# Patient Record
Sex: Male | Born: 1935
Health system: Southern US, Community
[De-identification: ages and names within clinical notes are randomized; demographics above are authoritative.]

## PROBLEM LIST (undated history)

## (undated) DIAGNOSIS — E786 Lipoprotein deficiency: Secondary | ICD-10-CM

## (undated) DIAGNOSIS — K259 Gastric ulcer, unspecified as acute or chronic, without hemorrhage or perforation: Secondary | ICD-10-CM

## (undated) DIAGNOSIS — L570 Actinic keratosis: Secondary | ICD-10-CM

## (undated) DIAGNOSIS — Z87891 Personal history of nicotine dependence: Secondary | ICD-10-CM

## (undated) DIAGNOSIS — M1990 Unspecified osteoarthritis, unspecified site: Secondary | ICD-10-CM

## (undated) DIAGNOSIS — Z8673 Personal history of transient ischemic attack (TIA), and cerebral infarction without residual deficits: Secondary | ICD-10-CM

## (undated) DIAGNOSIS — R7303 Prediabetes: Secondary | ICD-10-CM

## (undated) DIAGNOSIS — F329 Major depressive disorder, single episode, unspecified: Secondary | ICD-10-CM

## (undated) DIAGNOSIS — H269 Unspecified cataract: Secondary | ICD-10-CM

## (undated) DIAGNOSIS — I10 Essential (primary) hypertension: Secondary | ICD-10-CM

## (undated) DIAGNOSIS — D649 Anemia, unspecified: Secondary | ICD-10-CM

## (undated) DIAGNOSIS — C4491 Basal cell carcinoma of skin, unspecified: Secondary | ICD-10-CM

## (undated) DIAGNOSIS — E785 Hyperlipidemia, unspecified: Secondary | ICD-10-CM

## (undated) DIAGNOSIS — F0390 Unspecified dementia without behavioral disturbance: Secondary | ICD-10-CM

## (undated) DIAGNOSIS — K5792 Diverticulitis of intestine, part unspecified, without perforation or abscess without bleeding: Secondary | ICD-10-CM

## (undated) DIAGNOSIS — R739 Hyperglycemia, unspecified: Secondary | ICD-10-CM

## (undated) DIAGNOSIS — J449 Chronic obstructive pulmonary disease, unspecified: Secondary | ICD-10-CM

## (undated) DIAGNOSIS — F32A Depression, unspecified: Secondary | ICD-10-CM

## (undated) DIAGNOSIS — C439 Malignant melanoma of skin, unspecified: Secondary | ICD-10-CM

## (undated) DIAGNOSIS — I5030 Unspecified diastolic (congestive) heart failure: Secondary | ICD-10-CM

## (undated) DIAGNOSIS — J189 Pneumonia, unspecified organism: Secondary | ICD-10-CM

## (undated) DIAGNOSIS — Z973 Presence of spectacles and contact lenses: Secondary | ICD-10-CM

## (undated) DIAGNOSIS — Z9289 Personal history of other medical treatment: Secondary | ICD-10-CM

## (undated) DIAGNOSIS — G2581 Restless legs syndrome: Secondary | ICD-10-CM

## (undated) HISTORY — DX: Prediabetes: R73.03

## (undated) HISTORY — PX: NASAL SEPTUM SURGERY: SHX37

## (undated) HISTORY — DX: Lipoprotein deficiency: E78.6

## (undated) HISTORY — DX: Gastric ulcer, unspecified as acute or chronic, without hemorrhage or perforation: K25.9

## (undated) HISTORY — DX: Anemia, unspecified: D64.9

## (undated) HISTORY — PX: VASECTOMY: SHX75

## (undated) HISTORY — DX: Essential (primary) hypertension: I10

## (undated) HISTORY — DX: Personal history of transient ischemic attack (TIA), and cerebral infarction without residual deficits: Z86.73

## (undated) HISTORY — DX: Actinic keratosis: L57.0

## (undated) HISTORY — DX: Hyperlipidemia, unspecified: E78.5

## (undated) HISTORY — DX: Unspecified diastolic (congestive) heart failure: I50.30

## (undated) HISTORY — PX: MELANOMA EXCISION: SHX5266

## (undated) HISTORY — PX: DRUG INDUCED ENDOSCOPY: SHX6808

## (undated) HISTORY — DX: Restless legs syndrome: G25.81

## (undated) HISTORY — PX: COLONOSCOPY: SHX174

## (undated) HISTORY — PX: PROSTATE SURGERY: SHX751

## (undated) HISTORY — DX: Basal cell carcinoma of skin, unspecified: C44.91

## (undated) HISTORY — PX: HEMORROIDECTOMY: SUR656

## (undated) HISTORY — DX: Chronic obstructive pulmonary disease, unspecified: J44.9

## (undated) HISTORY — PX: CATARACT EXTRACTION: SUR2

## (undated) HISTORY — DX: Hyperglycemia, unspecified: R73.9

---
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
## 2010-05-01 DIAGNOSIS — I639 Cerebral infarction, unspecified: Secondary | ICD-10-CM | POA: Insufficient documentation

## 2010-05-01 HISTORY — DX: Cerebral infarction, unspecified: I63.9

## 2015-06-09 DIAGNOSIS — J449 Chronic obstructive pulmonary disease, unspecified: Secondary | ICD-10-CM

## 2015-06-09 DIAGNOSIS — I119 Hypertensive heart disease without heart failure: Secondary | ICD-10-CM | POA: Insufficient documentation

## 2015-06-09 DIAGNOSIS — N529 Male erectile dysfunction, unspecified: Secondary | ICD-10-CM

## 2015-06-09 DIAGNOSIS — F0150 Vascular dementia without behavioral disturbance: Secondary | ICD-10-CM

## 2015-06-09 DIAGNOSIS — E538 Deficiency of other specified B group vitamins: Secondary | ICD-10-CM

## 2015-06-09 DIAGNOSIS — R600 Localized edema: Secondary | ICD-10-CM

## 2015-06-09 DIAGNOSIS — R260 Ataxic gait: Secondary | ICD-10-CM

## 2015-06-09 DIAGNOSIS — F3342 Major depressive disorder, recurrent, in full remission: Secondary | ICD-10-CM

## 2015-06-09 DIAGNOSIS — I679 Cerebrovascular disease, unspecified: Secondary | ICD-10-CM

## 2015-06-09 DIAGNOSIS — N400 Enlarged prostate without lower urinary tract symptoms: Secondary | ICD-10-CM

## 2015-06-09 DIAGNOSIS — R6882 Decreased libido: Secondary | ICD-10-CM

## 2015-06-09 DIAGNOSIS — I739 Peripheral vascular disease, unspecified: Secondary | ICD-10-CM

## 2015-06-09 HISTORY — DX: Cerebrovascular disease, unspecified: I67.9

## 2015-06-09 HISTORY — DX: Ataxic gait: R26.0

## 2015-06-09 HISTORY — DX: Peripheral vascular disease, unspecified: I73.9

## 2015-06-09 HISTORY — DX: Vascular dementia, unspecified severity, without behavioral disturbance, psychotic disturbance, mood disturbance, and anxiety: F01.50

## 2015-06-09 HISTORY — DX: Decreased libido: R68.82

## 2015-06-09 HISTORY — DX: Chronic obstructive pulmonary disease, unspecified: J44.9

## 2015-06-09 HISTORY — DX: Hypertensive heart disease without heart failure: I11.9

## 2015-06-09 HISTORY — DX: Benign prostatic hyperplasia without lower urinary tract symptoms: N40.0

## 2015-06-09 HISTORY — DX: Male erectile dysfunction, unspecified: N52.9

## 2015-06-09 HISTORY — DX: Localized edema: R60.0

## 2015-06-09 HISTORY — DX: Major depressive disorder, recurrent, in full remission: F33.42

## 2015-06-09 HISTORY — DX: Deficiency of other specified B group vitamins: E53.8

## 2015-06-10 DIAGNOSIS — K2100 Gastro-esophageal reflux disease with esophagitis, without bleeding: Secondary | ICD-10-CM | POA: Insufficient documentation

## 2015-06-10 DIAGNOSIS — K257 Chronic gastric ulcer without hemorrhage or perforation: Secondary | ICD-10-CM

## 2015-06-10 HISTORY — DX: Chronic gastric ulcer without hemorrhage or perforation: K25.7

## 2015-06-10 HISTORY — DX: Gastro-esophageal reflux disease with esophagitis, without bleeding: K21.00

## 2016-05-16 DIAGNOSIS — H35371 Puckering of macula, right eye: Secondary | ICD-10-CM | POA: Diagnosis not present

## 2016-05-16 DIAGNOSIS — Z01818 Encounter for other preprocedural examination: Secondary | ICD-10-CM | POA: Diagnosis not present

## 2016-06-01 DIAGNOSIS — K591 Functional diarrhea: Secondary | ICD-10-CM | POA: Diagnosis not present

## 2016-06-08 DIAGNOSIS — H35371 Puckering of macula, right eye: Secondary | ICD-10-CM | POA: Diagnosis not present

## 2016-06-16 DIAGNOSIS — N183 Chronic kidney disease, stage 3 (moderate): Secondary | ICD-10-CM | POA: Diagnosis not present

## 2016-06-16 DIAGNOSIS — I129 Hypertensive chronic kidney disease with stage 1 through stage 4 chronic kidney disease, or unspecified chronic kidney disease: Secondary | ICD-10-CM | POA: Diagnosis not present

## 2016-06-16 DIAGNOSIS — E782 Mixed hyperlipidemia: Secondary | ICD-10-CM | POA: Diagnosis not present

## 2016-06-16 DIAGNOSIS — G2581 Restless legs syndrome: Secondary | ICD-10-CM | POA: Diagnosis not present

## 2016-06-26 DIAGNOSIS — L570 Actinic keratosis: Secondary | ICD-10-CM | POA: Diagnosis not present

## 2016-06-26 DIAGNOSIS — Z85820 Personal history of malignant melanoma of skin: Secondary | ICD-10-CM | POA: Diagnosis not present

## 2016-06-26 DIAGNOSIS — D0359 Melanoma in situ of other part of trunk: Secondary | ICD-10-CM | POA: Diagnosis not present

## 2016-06-26 DIAGNOSIS — C44311 Basal cell carcinoma of skin of nose: Secondary | ICD-10-CM | POA: Diagnosis not present

## 2016-06-26 DIAGNOSIS — L578 Other skin changes due to chronic exposure to nonionizing radiation: Secondary | ICD-10-CM | POA: Diagnosis not present

## 2016-07-17 DIAGNOSIS — K529 Noninfective gastroenteritis and colitis, unspecified: Secondary | ICD-10-CM | POA: Diagnosis not present

## 2016-07-17 DIAGNOSIS — E291 Testicular hypofunction: Secondary | ICD-10-CM | POA: Diagnosis not present

## 2016-07-18 DIAGNOSIS — Z85820 Personal history of malignant melanoma of skin: Secondary | ICD-10-CM | POA: Diagnosis not present

## 2016-07-18 DIAGNOSIS — C44311 Basal cell carcinoma of skin of nose: Secondary | ICD-10-CM | POA: Diagnosis not present

## 2016-07-18 DIAGNOSIS — L570 Actinic keratosis: Secondary | ICD-10-CM | POA: Diagnosis not present

## 2016-07-18 DIAGNOSIS — L578 Other skin changes due to chronic exposure to nonionizing radiation: Secondary | ICD-10-CM | POA: Diagnosis not present

## 2016-07-18 DIAGNOSIS — L821 Other seborrheic keratosis: Secondary | ICD-10-CM | POA: Diagnosis not present

## 2016-07-24 DIAGNOSIS — Z91018 Allergy to other foods: Secondary | ICD-10-CM | POA: Diagnosis not present

## 2016-08-14 DIAGNOSIS — K529 Noninfective gastroenteritis and colitis, unspecified: Secondary | ICD-10-CM | POA: Diagnosis not present

## 2016-08-14 DIAGNOSIS — Z91018 Allergy to other foods: Secondary | ICD-10-CM | POA: Diagnosis not present

## 2016-08-21 DIAGNOSIS — J449 Chronic obstructive pulmonary disease, unspecified: Secondary | ICD-10-CM | POA: Diagnosis not present

## 2016-08-21 DIAGNOSIS — M25511 Pain in right shoulder: Secondary | ICD-10-CM | POA: Diagnosis not present

## 2016-08-21 DIAGNOSIS — R413 Other amnesia: Secondary | ICD-10-CM | POA: Diagnosis not present

## 2016-08-21 DIAGNOSIS — E291 Testicular hypofunction: Secondary | ICD-10-CM | POA: Diagnosis not present

## 2016-08-24 DIAGNOSIS — H35372 Puckering of macula, left eye: Secondary | ICD-10-CM | POA: Diagnosis not present

## 2016-09-12 DIAGNOSIS — E291 Testicular hypofunction: Secondary | ICD-10-CM | POA: Diagnosis not present

## 2016-09-12 DIAGNOSIS — J449 Chronic obstructive pulmonary disease, unspecified: Secondary | ICD-10-CM | POA: Diagnosis not present

## 2016-09-12 DIAGNOSIS — R413 Other amnesia: Secondary | ICD-10-CM | POA: Diagnosis not present

## 2016-09-12 DIAGNOSIS — Z91018 Allergy to other foods: Secondary | ICD-10-CM | POA: Diagnosis not present

## 2016-09-12 DIAGNOSIS — K529 Noninfective gastroenteritis and colitis, unspecified: Secondary | ICD-10-CM | POA: Diagnosis not present

## 2016-10-03 DIAGNOSIS — R948 Abnormal results of function studies of other organs and systems: Secondary | ICD-10-CM | POA: Diagnosis not present

## 2016-10-03 DIAGNOSIS — E291 Testicular hypofunction: Secondary | ICD-10-CM | POA: Diagnosis not present

## 2016-11-15 DIAGNOSIS — K269 Duodenal ulcer, unspecified as acute or chronic, without hemorrhage or perforation: Secondary | ICD-10-CM | POA: Diagnosis not present

## 2016-11-15 DIAGNOSIS — K591 Functional diarrhea: Secondary | ICD-10-CM | POA: Diagnosis not present

## 2016-11-15 DIAGNOSIS — K2980 Duodenitis without bleeding: Secondary | ICD-10-CM | POA: Diagnosis not present

## 2016-11-15 DIAGNOSIS — K317 Polyp of stomach and duodenum: Secondary | ICD-10-CM | POA: Diagnosis not present

## 2016-11-15 DIAGNOSIS — K5730 Diverticulosis of large intestine without perforation or abscess without bleeding: Secondary | ICD-10-CM | POA: Diagnosis not present

## 2016-11-15 DIAGNOSIS — K648 Other hemorrhoids: Secondary | ICD-10-CM | POA: Diagnosis not present

## 2016-11-15 DIAGNOSIS — D122 Benign neoplasm of ascending colon: Secondary | ICD-10-CM | POA: Diagnosis not present

## 2016-12-05 DIAGNOSIS — E782 Mixed hyperlipidemia: Secondary | ICD-10-CM | POA: Diagnosis not present

## 2016-12-05 DIAGNOSIS — Z0000 Encounter for general adult medical examination without abnormal findings: Secondary | ICD-10-CM | POA: Diagnosis not present

## 2016-12-05 DIAGNOSIS — Z1211 Encounter for screening for malignant neoplasm of colon: Secondary | ICD-10-CM | POA: Diagnosis not present

## 2016-12-05 DIAGNOSIS — R739 Hyperglycemia, unspecified: Secondary | ICD-10-CM | POA: Diagnosis not present

## 2016-12-05 DIAGNOSIS — Z79899 Other long term (current) drug therapy: Secondary | ICD-10-CM | POA: Diagnosis not present

## 2016-12-05 DIAGNOSIS — Z125 Encounter for screening for malignant neoplasm of prostate: Secondary | ICD-10-CM | POA: Diagnosis not present

## 2016-12-05 DIAGNOSIS — I129 Hypertensive chronic kidney disease with stage 1 through stage 4 chronic kidney disease, or unspecified chronic kidney disease: Secondary | ICD-10-CM | POA: Diagnosis not present

## 2016-12-20 DIAGNOSIS — Z136 Encounter for screening for cardiovascular disorders: Secondary | ICD-10-CM | POA: Diagnosis not present

## 2016-12-20 DIAGNOSIS — E785 Hyperlipidemia, unspecified: Secondary | ICD-10-CM | POA: Diagnosis not present

## 2016-12-20 DIAGNOSIS — Z1389 Encounter for screening for other disorder: Secondary | ICD-10-CM | POA: Diagnosis not present

## 2016-12-20 DIAGNOSIS — Z0000 Encounter for general adult medical examination without abnormal findings: Secondary | ICD-10-CM | POA: Diagnosis not present

## 2016-12-25 DIAGNOSIS — H524 Presbyopia: Secondary | ICD-10-CM | POA: Diagnosis not present

## 2016-12-25 DIAGNOSIS — H35372 Puckering of macula, left eye: Secondary | ICD-10-CM | POA: Diagnosis not present

## 2017-01-05 DIAGNOSIS — E291 Testicular hypofunction: Secondary | ICD-10-CM | POA: Diagnosis not present

## 2017-02-21 DIAGNOSIS — I10 Essential (primary) hypertension: Secondary | ICD-10-CM | POA: Diagnosis not present

## 2017-02-21 DIAGNOSIS — D126 Benign neoplasm of colon, unspecified: Secondary | ICD-10-CM | POA: Diagnosis not present

## 2017-02-21 DIAGNOSIS — D122 Benign neoplasm of ascending colon: Secondary | ICD-10-CM | POA: Diagnosis not present

## 2017-02-21 DIAGNOSIS — Z885 Allergy status to narcotic agent status: Secondary | ICD-10-CM | POA: Diagnosis not present

## 2017-02-21 DIAGNOSIS — Z7902 Long term (current) use of antithrombotics/antiplatelets: Secondary | ICD-10-CM | POA: Diagnosis not present

## 2017-02-21 DIAGNOSIS — K210 Gastro-esophageal reflux disease with esophagitis: Secondary | ICD-10-CM | POA: Diagnosis not present

## 2017-02-21 DIAGNOSIS — K257 Chronic gastric ulcer without hemorrhage or perforation: Secondary | ICD-10-CM | POA: Diagnosis not present

## 2017-02-21 DIAGNOSIS — J449 Chronic obstructive pulmonary disease, unspecified: Secondary | ICD-10-CM | POA: Diagnosis not present

## 2017-02-21 DIAGNOSIS — Z79899 Other long term (current) drug therapy: Secondary | ICD-10-CM | POA: Diagnosis not present

## 2017-02-21 DIAGNOSIS — Z91013 Allergy to seafood: Secondary | ICD-10-CM | POA: Diagnosis not present

## 2017-02-21 DIAGNOSIS — Z87891 Personal history of nicotine dependence: Secondary | ICD-10-CM | POA: Diagnosis not present

## 2017-02-27 DIAGNOSIS — Z23 Encounter for immunization: Secondary | ICD-10-CM | POA: Diagnosis not present

## 2017-02-27 DIAGNOSIS — E291 Testicular hypofunction: Secondary | ICD-10-CM | POA: Diagnosis not present

## 2017-03-14 DIAGNOSIS — E559 Vitamin D deficiency, unspecified: Secondary | ICD-10-CM | POA: Diagnosis not present

## 2017-03-14 DIAGNOSIS — I129 Hypertensive chronic kidney disease with stage 1 through stage 4 chronic kidney disease, or unspecified chronic kidney disease: Secondary | ICD-10-CM | POA: Diagnosis not present

## 2017-03-14 DIAGNOSIS — E782 Mixed hyperlipidemia: Secondary | ICD-10-CM | POA: Diagnosis not present

## 2017-03-14 DIAGNOSIS — J449 Chronic obstructive pulmonary disease, unspecified: Secondary | ICD-10-CM | POA: Diagnosis not present

## 2017-03-14 DIAGNOSIS — N183 Chronic kidney disease, stage 3 (moderate): Secondary | ICD-10-CM | POA: Diagnosis not present

## 2017-03-28 DIAGNOSIS — H35372 Puckering of macula, left eye: Secondary | ICD-10-CM | POA: Diagnosis not present

## 2017-04-21 DIAGNOSIS — J069 Acute upper respiratory infection, unspecified: Secondary | ICD-10-CM | POA: Diagnosis not present

## 2017-04-21 DIAGNOSIS — J209 Acute bronchitis, unspecified: Secondary | ICD-10-CM | POA: Diagnosis not present

## 2017-04-21 DIAGNOSIS — J449 Chronic obstructive pulmonary disease, unspecified: Secondary | ICD-10-CM | POA: Diagnosis not present

## 2017-05-05 DIAGNOSIS — J441 Chronic obstructive pulmonary disease with (acute) exacerbation: Secondary | ICD-10-CM | POA: Diagnosis not present

## 2017-05-05 DIAGNOSIS — J440 Chronic obstructive pulmonary disease with acute lower respiratory infection: Secondary | ICD-10-CM | POA: Diagnosis not present

## 2017-05-05 DIAGNOSIS — R05 Cough: Secondary | ICD-10-CM | POA: Diagnosis not present

## 2017-05-11 DIAGNOSIS — J440 Chronic obstructive pulmonary disease with acute lower respiratory infection: Secondary | ICD-10-CM | POA: Diagnosis not present

## 2017-05-11 DIAGNOSIS — J449 Chronic obstructive pulmonary disease, unspecified: Secondary | ICD-10-CM | POA: Diagnosis not present

## 2017-05-23 DIAGNOSIS — J449 Chronic obstructive pulmonary disease, unspecified: Secondary | ICD-10-CM | POA: Diagnosis not present

## 2017-06-11 DIAGNOSIS — J449 Chronic obstructive pulmonary disease, unspecified: Secondary | ICD-10-CM | POA: Diagnosis not present

## 2017-06-26 ENCOUNTER — Other Ambulatory Visit (INDEPENDENT_AMBULATORY_CARE_PROVIDER_SITE_OTHER): Payer: Medicare Other

## 2017-06-26 ENCOUNTER — Encounter: Payer: Self-pay | Admitting: Pulmonary Disease

## 2017-06-26 ENCOUNTER — Ambulatory Visit: Payer: Medicare Other | Admitting: Pulmonary Disease

## 2017-06-26 ENCOUNTER — Ambulatory Visit (INDEPENDENT_AMBULATORY_CARE_PROVIDER_SITE_OTHER)
Admission: RE | Admit: 2017-06-26 | Discharge: 2017-06-26 | Disposition: A | Discharge status: Home / Self Care | Payer: Medicare Other | Source: Ambulatory Visit | Attending: Pulmonary Disease | Admitting: Pulmonary Disease

## 2017-06-26 VITALS — BP 132/80 | HR 64 | Ht 64.0 in | Wt 176.0 lb

## 2017-06-26 DIAGNOSIS — R29898 Other symptoms and signs involving the musculoskeletal system: Secondary | ICD-10-CM | POA: Diagnosis not present

## 2017-06-26 DIAGNOSIS — I509 Heart failure, unspecified: Secondary | ICD-10-CM | POA: Diagnosis not present

## 2017-06-26 DIAGNOSIS — J449 Chronic obstructive pulmonary disease, unspecified: Secondary | ICD-10-CM | POA: Diagnosis not present

## 2017-06-26 DIAGNOSIS — J40 Bronchitis, not specified as acute or chronic: Secondary | ICD-10-CM | POA: Diagnosis not present

## 2017-06-26 DIAGNOSIS — R0600 Dyspnea, unspecified: Secondary | ICD-10-CM

## 2017-06-26 LAB — CBC WITH DIFFERENTIAL/PLATELET
Basophils Absolute: 0.1 10*3/uL (ref 0.0–0.1)
Basophils Relative: 0.9 % (ref 0.0–3.0)
EOS ABS: 0.2 10*3/uL (ref 0.0–0.7)
Eosinophils Relative: 2.2 % (ref 0.0–5.0)
HCT: 42.2 % (ref 39.0–52.0)
HEMOGLOBIN: 14.1 g/dL (ref 13.0–17.0)
LYMPHS ABS: 1.8 10*3/uL (ref 0.7–4.0)
Lymphocytes Relative: 22.3 % (ref 12.0–46.0)
MCHC: 33.4 g/dL (ref 30.0–36.0)
MCV: 91.7 fl (ref 78.0–100.0)
MONO ABS: 0.7 10*3/uL (ref 0.1–1.0)
Monocytes Relative: 8.1 % (ref 3.0–12.0)
NEUTROS PCT: 66.5 % (ref 43.0–77.0)
Neutro Abs: 5.4 10*3/uL (ref 1.4–7.7)
Platelets: 245 10*3/uL (ref 150.0–400.0)
RBC: 4.6 Mil/uL (ref 4.22–5.81)
RDW: 14 % (ref 11.5–15.5)
WBC: 8.1 10*3/uL (ref 4.0–10.5)

## 2017-06-26 LAB — TIQ-NTM

## 2017-06-26 LAB — BASIC METABOLIC PANEL
BUN: 30 mg/dL — AB (ref 6–23)
CALCIUM: 9.3 mg/dL (ref 8.4–10.5)
CO2: 24 mEq/L (ref 19–32)
CREATININE: 1.4 mg/dL (ref 0.40–1.50)
Chloride: 107 mEq/L (ref 96–112)
GFR: 51.53 mL/min — ABNORMAL LOW (ref >60.00)
Glucose, Bld: 99 mg/dL (ref 70–99)
Potassium: 4.3 mEq/L (ref 3.5–5.1)
Sodium: 138 mEq/L (ref 135–145)

## 2017-06-26 MED ORDER — FUROSEMIDE 20 MG PO TABS: 40.0000 mg | ORAL_TABLET | Freq: Every day | ORAL | Status: DC

## 2017-06-26 NOTE — Progress Notes (Signed)
Subjective:   PATIENT ID: Kevin Lynch. GENDER: male DOB: 1932/11/22, MRN: [id number]  Synopsis: Referred in February 2019 for COPD and pneumonia.  He has a history of CHF and had a stroke in 2013.  He smoked 1/2 ppd for 30 years, quit in 1983.  HPI  Chief Complaint  Patient presents with  . Advice Only    Referred by Dr. Marcello Moores for COPD.     Kevin Lynch was referred by his PCP for his COPD. > he has been taking Trelegy lately > in December he had a sinus infection and required antibiotics and prednisone > In January he developed the sensation of drowning on January 5 and he had to go to the ER for a few hours. > he was given breathing treatments and had a CXR > he was diagnosed with pneumonia > since then he has been coughing up mucus, though it is not as thick and yellow as it was when he started > he was treated with an antibiotic and prednisone once again > he continues to cough every day and he still feels short of breath > she feels like whenever he tries to exercise he feels short of breath > he wants to walk to the dog but he can't do that anymore due to the dyspnea > he says that the albuterol inhaler helps him when he takes it > he does'nt use the albuterol too much > In December he was exercising nearly every day > he uses his albuterol nebulizer a few times a day > his wife says that he was diagnosed 2-3 years ago, initially started on Breo > recently changed to Trelegy after all the trouble he has been having  He has been told that he has CHF > he has a little leg swelling > His weight has gone up in the last few months, about 10-15 pounds > He takes spironolactone  He takes plavix for a stoke.    He used to smoke cigarettes  Dyspnea: > when short of breath he notices wheezing > he feels a little chest congestion  Cough: > productive of mucus, typically yellow in color > He is coughing this up every day  He had a stroke in 2013.  Past Medical  History:  Diagnosis Date  . COPD (chronic obstructive pulmonary disease) ([place])   . Hyperglycemia   . Hyperlipidemia   . Hypertension   . Restless leg syndrome      Family History  Problem Relation Age of Onset  . Stroke Mother   . Asthma Father      Social History   Socioeconomic History  . Marital status: Married    Spouse name: Not on file  . Number of children: Not on file  . Years of education: Not on file  . Highest education level: Not on file  Social Needs  . Financial resource strain: Not on file  . Food insecurity - worry: Not on file  . Food insecurity - inability: Not on file  . Transportation needs - medical: Not on file  . Transportation needs - non-medical: Not on file  Occupational History  . Not on file  Tobacco Use  . Smoking status: Former Smoker    Packs/day: 0.50    Years: 30.00    Pack years: 15.00    Types: Cigarettes    Last attempt to quit: 05/01/1981    Years since quitting: 36.1  . Smokeless tobacco: Former Systems developer  Types: Chew  Substance and Sexual Activity  . Alcohol use: Not on file  . Drug use: Not on file  . Sexual activity: Not on file  Other Topics Concern  . Not on file  Social History Narrative  . Not on file     Allergies  Allergen Reactions  . Shellfish Allergy Hives and Shortness Of Breath  . Codeine Nausea Only     Outpatient Medications Prior to Visit  Medication Sig Dispense Refill  . clopidogrel (PLAVIX) 75 MG tablet Take 75 mg by mouth daily.    Marland Kitchen donepezil (ARICEPT) 10 MG tablet Take 10 mg by mouth at bedtime.    Marland Kitchen escitalopram (LEXAPRO) 10 MG tablet Take 10 mg by mouth daily.    . metoprolol succinate (TOPROL-XL) 25 MG 24 hr tablet Take 25 mg by mouth daily.    Marland Kitchen spironolactone (ALDACTONE) 25 MG tablet Take 25 mg by mouth daily.    Marland Kitchen terazosin (HYTRIN) 5 MG capsule Take 5 mg by mouth at bedtime.    . valsartan (DIOVAN) 160 MG tablet Take 160 mg by mouth daily.     No facility-administered medications prior to  visit.     Review of Systems  Constitutional: Negative for fever and weight loss.  HENT: Positive for congestion. Negative for ear pain, nosebleeds and sore throat.   Eyes: Negative for redness.  Respiratory: Positive for cough and shortness of breath. Negative for wheezing.   Cardiovascular: Negative for palpitations, leg swelling and PND.  Gastrointestinal: Negative for nausea and vomiting.  Genitourinary: Negative for dysuria.  Skin: Negative for rash.  Neurological: Negative for headaches.  Endo/Heme/Allergies: Does not bruise/bleed easily.  Psychiatric/Behavioral: Negative for depression. The patient is not nervous/anxious.       Objective:  Physical Exam   Vitals:   06/26/17 1622  BP: 132/80  Pulse: 64  SpO2: 95%  Weight: 176 lb (79.8 kg)  Height: 5\' 4"  (1.626 m)   Walked 500 feet on RA and his O2 saturation stayed above 94%  Gen: chronically ill appearing, no acute distress HENT: NCAT, OP clear, neck supple without masses Eyes: PERRL, EOMi Lymph: no cervical lymphadenopathy PULM: CTA B CV: RRR, no mgr, no JVD GI: BS+, soft, nontender, no hsm Derm: no rash or skin breakdown MSK: normal bulk and tone Neuro: A&Ox4, CN II-XII intact, some muscle wasting in thenar muscles of R hand, otherwise WNL Psyche: normal mood and affect   CBC No results found for: WBC, RBC, HGB, HCT, PLT, MCV, MCH, MCHC, RDW, LYMPHSABS, MONOABS, EOSABS, BASOSABS   Chest imaging: January 2019 chest x-ray images independently reviewed showing emphysema some nonspecific scarring versus atelectasis in the bases  PFT:  Labs:  Path:  Echo:  Heart Catheterization:   Records from his visit with his primary care physician in January reviewed, he was hospitalized in early January around the 11th with a COPD exacerbation and pneumonia.  He has a past medical history significant for COPD, hyperlipidemia.    Assessment & Plan:   Chronic obstructive pulmonary disease, unspecified COPD  type ([place]) - Plan: Spirometry with Graph  Dyspnea, unspecified type - Plan: DG Chest 2 View, Fungus Culture & Smear, Respiratory or Resp and Sputum Culture, AFB Culture & Smear, CBC w/Diff, B Nat Peptide, IgE, ECHOCARDIOGRAM COMPLETE  Congestive heart failure, unspecified HF chronicity, unspecified heart failure type [facility])  Muscular deconditioning  Discussion: Mr. Stick presents today with a complaint of shortness of breath after a severe COPD exacerbation in early January.  His symptoms may be explained just by severe COPD alone but I think that the recent weight gain, his volume overload status on physical exam and history of CHF are also at play.  He also seems to be a bit deconditioned compared to baseline.  It sounds as if he has vigorously exercised in the past but in the last several months he has been gaining weight and has not exercised with this illness.  So I think there is a significant amount of deconditioning.  I explained to him that the differential diagnosis of dyspnea is broad and includes heart disease, lung disease, and hematologic disease among others.  Neurologic disease can also contribute.  In his particular case with a diagnosis of COPD, CHF, and prior stroke with muscular deconditioning there is no surprise that he does have some shortness of breath.  Is not clear to me today if a change in his inhaler regimen will make a difference.  Plan: COPD: Check ambulatory oxygen level with walking today Spirometry testing today Continue Trelegy for now Use albuterol as needed for chest tightness wheezing or shortness of breath  Shortness of breath: Repeat chest x-ray Check complete blood count to look for anemia Check pro brain natruretic peptide to look for evidence of heart failure  Congestive heart failure: Take Lasix 40 mg daily times 5 days Check echocardiogram Come back in 1 week to see our nurse practitioner and will have a basic metabolic panel to make sure that  your potassium level is normal  Physical deconditioning: You likely need to be referred to pulmonary rehab, but we will check these tests before referring you  Chronic bronchitis: I think this goes along with COPD We will check mucus today to look for evidence of an underlying chronic infection, please provide [place] with a sample of your mucus  We will see you back in 1 week with an NP    Current Outpatient Medications:  .  clopidogrel (PLAVIX) 75 MG tablet, Take 75 mg by mouth daily., Disp: , Rfl:  .  donepezil (ARICEPT) 10 MG tablet, Take 10 mg by mouth at bedtime., Disp: , Rfl:  .  escitalopram (LEXAPRO) 10 MG tablet, Take 10 mg by mouth daily., Disp: , Rfl:  .  metoprolol succinate (TOPROL-XL) 25 MG 24 hr tablet, Take 25 mg by mouth daily., Disp: , Rfl:  .  spironolactone (ALDACTONE) 25 MG tablet, Take 25 mg by mouth daily., Disp: , Rfl:  .  terazosin (HYTRIN) 5 MG capsule, Take 5 mg by mouth at bedtime., Disp: , Rfl:  .  valsartan (DIOVAN) 160 MG tablet, Take 160 mg by mouth daily., Disp: , Rfl:

## 2017-06-26 NOTE — Patient Instructions (Addendum)
COPD: Check ambulatory oxygen level with walking today Spirometry testing today Continue Trelegy for now Use albuterol as needed for chest tightness wheezing or shortness of breath  Shortness of breath: Repeat chest x-ray Check complete blood count to look for anemia Check pro brain natruretic peptide to look for evidence of heart failure  Congestive heart failure: Take Lasix 40 mg daily times 5 days Check echocardiogram Come back in 1 week to see our nurse practitioner and will have a basic metabolic panel to make sure that your potassium level is normal  Physical deconditioning: You likely need to be referred to pulmonary rehab, but we will check these tests before referring you  Chronic bronchitis: I think this goes along with COPD We will check mucus today to look for evidence of an underlying chronic infection, please provide [place] with a sample of your mucus  We will see you back in 1 week with an NP

## 2017-06-27 ENCOUNTER — Ambulatory Visit (HOSPITAL_COMMUNITY): Payer: Medicare Other

## 2017-06-27 ENCOUNTER — Telehealth: Payer: Self-pay | Admitting: Pulmonary Disease

## 2017-06-27 LAB — IGE: IGE (IMMUNOGLOBULIN E), SERUM: 57 kU/L (ref <=114)

## 2017-06-27 LAB — BRAIN NATRIURETIC PEPTIDE: Pro B Natriuretic peptide (BNP): 68 pg/mL (ref 0.0–100.0)

## 2017-06-27 MED ORDER — FUROSEMIDE 40 MG PO TABS: 40.0000 mg | ORAL_TABLET | Freq: Every day | ORAL | 0 refills | Status: DC

## 2017-06-27 NOTE — Telephone Encounter (Signed)
Called and spoke with pt's wife Anne Ng letting her know that BQ had put the lasix in the computer wrong at pt's last OV with him.  I stated to her that I was going to take care of sending the Rx of lasix 40mg  to pt's pharmacy.  Nothing further needed at this current time.

## 2017-07-01 LAB — TEST AUTHORIZATION

## 2017-07-01 LAB — RESPIRATORY CULTURE OR RESPIRATORY AND SPUTUM CULTURE

## 2017-07-01 LAB — IGE: IGE (IMMUNOGLOBULIN E), SERUM: 55 kU/L (ref <=114)

## 2017-07-02 ENCOUNTER — Other Ambulatory Visit: Payer: Medicare Other

## 2017-07-02 DIAGNOSIS — R0600 Dyspnea, unspecified: Secondary | ICD-10-CM

## 2017-07-03 NOTE — Progress Notes (Addendum)
History of Present Illness Kevin Lynch. is a 84 y.o. male former smoker with a 15 pack year smoking history ( Quit 1983) with COPD, CHF, and history of pneumonia and stroke in 2013. He is followed by Dr. Lake Bells.   Synopsis: Referred in February 2019 for COPD and pneumonia with worsening dyspnea.  He has a history of CHF and had a stroke in 2013.  He smoked 1/2 ppd for 30 years, quit in 1983.  07/04/2017 1 week follow up:  Pt. Was seen by Dr. Lake Bells for consult for COPD on 06/26/2017. Assessment and plan after that visit are as follows:  Discussion: Dr. Lake Bells Kevin Lynch presents today with a complaint of shortness of breath after a severe COPD exacerbation in early January.  His symptoms may be explained just by severe COPD alone but I think that the recent weight gain, his volume overload status on physical exam and history of CHF are also at play.  He also seems to be a bit deconditioned compared to baseline.  It sounds as if he has vigorously exercised in the past but in the last several months he has been gaining weight and has not exercised with this illness.  So I think there is a significant amount of deconditioning.  Dr. Lake Bells  explained  that the differential diagnosis of dyspnea is broad and includes heart disease, lung disease, and hematologic disease among others.  Neurologic disease can also contribute.  In his particular case with a diagnosis of COPD, CHF, and prior stroke with muscular deconditioning there is no surprise that he does have some shortness of breath.  Is not clear to me today if a change in his inhaler regimen will make a difference.  Plan: COPD: Check ambulatory oxygen level with walking today Spirometry testing today Continue Trelegy for now Use albuterol as needed for chest tightness wheezing or shortness of breath  Shortness of breath: Repeat chest x-ray Check complete blood count to look for anemia Check pro brain natruretic peptide to look for  evidence of heart failure  Congestive heart failure: Take Lasix 40 mg daily times 5 days Check echocardiogram Come back in 1 week to see our nurse practitioner and will have a basic metabolic panel to make sure that your potassium level is normal  Physical deconditioning: You likely need to be referred to pulmonary rehab, but we will check these tests before referring you  Chronic bronchitis: I think this goes along with COPD We will check mucus today to look for evidence of an underlying chronic infection, please provide [place] with a sample of your mucus    Pt. Presents for follow up. He states he is doing well. He has been compliant with his Trelegy and only uses his rescue inhaler as needed. He states this is no more than once or twice a month.He states he has been using  the breathing treatments and that they  have helped with his dyspnea.Marland Kitchen He is using them 2-3 times a day. He states he does have some dyspnea with exertion when he walks his dog. He states he does cough up secretions, but he does not have a sample today. Secretions  are clear. He was compliant with the 5 days of Lasix.He states lost 9 pounds..He states he is breathing much better.He went for his echo today. Over all he feels much better. He is interested in pulmonary rehab.  Test Results: BMET 07/04/2017>> K+: 3.9, Creatinine 1.62, BUN 40 CXR 06/26/2017>> Mild chronic bronchitic  changes with stable bibasilar atelectasis versus scarring. No acute abnormalities.   07/04/2017>>Echo>>  EF 09-32%, Grade 1 diastolic HF Ascending Aorta mildly dilated Mitral Valve: Calcified annulus.  BMET 07/04/2017>>  Results for Kevin Lynch, Kevin Lynch (MRN [id number]) as of 07/06/2017 16:45  Ref. Range 07/04/2017 54:27  BASIC METABOLIC PANEL Unknown Rpt (A)  Sodium Latest Ref Range: 135 - 145 mEq/L 138  Potassium Latest Ref Range: 3.5 - 5.1 mEq/L 3.9  Chloride Latest Ref Range: 96 - 112 mEq/L 101  CO2 Latest Ref Range: 19 - 32 mEq/L 27  Glucose  Latest Ref Range: 70 - 99 mg/dL 105 (H)  BUN Latest Ref Range: 6 - 23 mg/dL 40 (H)  Creatinine Latest Ref Range: 0.40 - 1.50 mg/dL 1.62 (H)  Calcium Latest Ref Range: 8.4 - 10.5 mg/dL 9.5  GFR Latest Ref Range: >60.00 mL/min 43.54 (L)    Labs 06/26/17 HGB>>14.1 Pro BNP>> 68 IgE>> 55  Spirometry 2/26>> Normal FVC>> 2.98, 2.78, 93% FEV1>> 2.05, 2.28, 111% F/F Ratio>> 71%, 82%, 115% VC>> 2.98  CBC Latest Ref Rng & Units 06/26/2017  WBC 4.0 - 10.5 K/uL 8.1  Hemoglobin 13.0 - 17.0 g/dL 14.1  Hematocrit 39.0 - 52.0 % 42.2  Platelets 150.0 - 400.0 K/uL 245.0    BMP Latest Ref Rng & Units 07/04/2017 06/26/2017  Glucose 70 - 99 mg/dL 105(H) 99  BUN 6 - 23 mg/dL 40(H) 30(H)  Creatinine 0.40 - 1.50 mg/dL 1.62(H) 1.40  Sodium 135 - 145 mEq/L 138 138  Potassium 3.5 - 5.1 mEq/L 3.9 4.3  Chloride 96 - 112 mEq/L 101 107  CO2 19 - 32 mEq/L 27 24  Calcium 8.4 - 10.5 mg/dL 9.5 9.3    BNP No results found for: BNP  ProBNP    Component Value Date/Time   PROBNP 68.0 06/26/2017 1716    PFT No results found for: FEV1PRE, FEV1POST, FVCPRE, FVCPOST, TLC, DLCOUNC, PREFEV1FVCRT, PSTFEV1FVCRT  Dg Chest 2 View  Result Date: 06/27/2017 CLINICAL DATA:  Recent dyspnea for three months, hypertension, COPD, former smoker EXAM: CHEST  2 VIEW COMPARISON:  05/05/2017 FINDINGS: Borderline enlargement of cardiac silhouette. Atherosclerotic calcification aorta. Chronic peribronchial thickening with persistent atelectasis versus scarring at lung bases. No acute infiltrate, pleural effusion, or pneumothorax. Bones demineralized. IMPRESSION: Mild chronic bronchitic changes with stable bibasilar atelectasis versus scarring. No acute abnormalities. Electronically Signed   By: Lavonia Dana M.D.   On: 06/27/2017 09:26     Past medical hx Past Medical History:  Diagnosis Date  . COPD (chronic obstructive pulmonary disease) ([place])   . Hyperglycemia   . Hyperlipidemia   . Hypertension   . Restless leg syndrome        Social History   Tobacco Use  . Smoking status: Former Smoker    Packs/day: 0.50    Years: 30.00    Pack years: 15.00    Types: Cigarettes    Last attempt to quit: 05/01/1981    Years since quitting: 36.2  . Smokeless tobacco: Former Systems developer    Types: Chew  Substance Use Topics  . Alcohol use: Not on file  . Drug use: Not on file    Kevin Lynch reports that he quit smoking about 36 years ago. His smoking use included cigarettes. He has a 15.00 pack-year smoking history. He has quit using smokeless tobacco. His smokeless tobacco use included chew.  Tobacco Cessation:  Former smoker with a 15 pack year smoking history, quit in 1983  Past surgical hx, Family hx, Social hx all  reviewed.  Current Outpatient Medications on File Prior to Visit  Medication Sig  . clopidogrel (PLAVIX) 75 MG tablet Take 75 mg by mouth daily.  Marland Kitchen donepezil (ARICEPT) 10 MG tablet Take 10 mg by mouth at bedtime.  Marland Kitchen escitalopram (LEXAPRO) 10 MG tablet Take 10 mg by mouth daily.  . furosemide (LASIX) 40 MG tablet Take 1 tablet (40 mg total) by mouth daily.  . metoprolol succinate (TOPROL-XL) 25 MG 24 hr tablet Take 25 mg by mouth daily.  Marland Kitchen spironolactone (ALDACTONE) 25 MG tablet Take 25 mg by mouth daily.  Marland Kitchen terazosin (HYTRIN) 5 MG capsule Take 5 mg by mouth at bedtime.  . valsartan (DIOVAN) 160 MG tablet Take 160 mg by mouth daily.   No current facility-administered medications on file prior to visit.      Allergies  Allergen Reactions  . Shellfish Allergy Hives and Shortness Of Breath  . Codeine Nausea Only    Review Of Systems:  Constitutional:   No  weight loss, night sweats,  Fevers, chills, fatigue, or  lassitude.  HEENT:   No headaches,  Difficulty swallowing,  Tooth/dental problems, or  Sore throat,                No sneezing, itching, ear ache, nasal congestion, post nasal drip,   CV:  No chest pain,  Orthopnea, PND, +swelling in lower extremities, no anasarca, dizziness, palpitations,  syncope.   GI  No heartburn, indigestion, abdominal pain, nausea, vomiting, diarrhea, change in bowel habits, loss of appetite, bloody stools.   Resp: + but improved  shortness of breath with exertion less at rest.  + excess mucus, no productive cough,  No non-productive cough,  No coughing up of blood.  No change in color of mucus.  No wheezing.  No chest wall deformity  Skin: no rash or lesions.  GU: no dysuria, change in color of urine, no urgency or frequency.  No flank pain, no hematuria   MS:  No joint pain or swelling.  No decreased range of motion.  No back pain.  Psych:  No change in mood or affect. No depression or anxiety.  No memory loss.   Vital Signs BP 118/72 (BP Location: Left Arm, Cuff Size: Normal)   Pulse 77   Ht 5\' 7"  (1.702 m)   Wt 161 lb 9.6 oz (73.3 kg)   SpO2 95%   BMI 25.31 kg/m    Physical Exam:  General- No distress,  A&Ox3, pleasant ENT: No sinus tenderness, TM clear, pale nasal mucosa, no oral exudate,no post nasal drip, no LAN Cardiac: S1, S2, regular rate and rhythm, no murmur Chest: No wheeze/ rales/ dullness; no accessory muscle use, no nasal flaring, no sternal retractions Abd.: Soft Non-tender, non-distended, Obese Ext: No clubbing cyanosis, trace BLE edema Neuro:  Physically deconditioned, MAE x 4, A&O x 3 Skin: No rashes, warm and dry Psych: normal mood and behavior   Assessment/Plan:  COPD/ Dyspnea Worsened since COPD exacerbation 05/2017 Compliant with Trelegy Compliant with nebs Improved since lasix x 5 days ( 9 pounds weight loss) Suspect Deconditioning contributing No anemia ( HGB 14.1) Plan: We will do some labs today. ( BMET) We will call you with the results I will talk with Dr. Lake Bells about Pulmonary Rehab and Cardiology consult vs. Low maintenance lasix./ PFT's Continue Trelegy and neb treatments as you have been doing. Rinse mouth after use Bring in sputum specimen when able. Follow up with Dr. Lake Bells in 3 month to  make  sure you are still doing well. Please contact office for sooner follow up if symptoms do not improve or worsen or seek emergency care    Diastolic CHF ([place]) Grade 1 diastolic HF per Echo 07/5857 06/26/2017>>BNP= 68 Plan Reviewed Echo results BMET today to check potassium Check with Dr. Lake Bells about cards referral vs low dose lasix at intervals Encouraged daily weights Encourages low salt diet. Follow up in 3 months with Dr. Lake Bells  Please contact office for sooner follow up if symptoms do not improve or worsen or seek emergency care    Physical deconditioning Refer to pulmonary rehab Consider [facility]  Simple chronic bronchitis ([place]) COPD mix  IgE WNL Pt to provide sputum specimen when able    Magdalen Spatz, NP 07/04/2017  8:42 PM

## 2017-07-04 ENCOUNTER — Other Ambulatory Visit: Payer: Self-pay

## 2017-07-04 ENCOUNTER — Other Ambulatory Visit (INDEPENDENT_AMBULATORY_CARE_PROVIDER_SITE_OTHER): Payer: Medicare Other

## 2017-07-04 ENCOUNTER — Encounter: Payer: Self-pay | Admitting: Acute Care

## 2017-07-04 ENCOUNTER — Ambulatory Visit (HOSPITAL_COMMUNITY): Payer: Medicare Other | Attending: Cardiology

## 2017-07-04 ENCOUNTER — Ambulatory Visit: Payer: Medicare Other | Admitting: Acute Care

## 2017-07-04 VITALS — BP 118/72 | HR 77 | Ht 67.0 in | Wt 161.6 lb

## 2017-07-04 DIAGNOSIS — Z5181 Encounter for therapeutic drug level monitoring: Secondary | ICD-10-CM

## 2017-07-04 DIAGNOSIS — I10 Essential (primary) hypertension: Secondary | ICD-10-CM | POA: Diagnosis not present

## 2017-07-04 DIAGNOSIS — R0600 Dyspnea, unspecified: Secondary | ICD-10-CM | POA: Insufficient documentation

## 2017-07-04 DIAGNOSIS — R0609 Other forms of dyspnea: Secondary | ICD-10-CM | POA: Diagnosis not present

## 2017-07-04 DIAGNOSIS — I519 Heart disease, unspecified: Secondary | ICD-10-CM | POA: Diagnosis not present

## 2017-07-04 DIAGNOSIS — J410 Simple chronic bronchitis: Secondary | ICD-10-CM

## 2017-07-04 DIAGNOSIS — J449 Chronic obstructive pulmonary disease, unspecified: Secondary | ICD-10-CM | POA: Insufficient documentation

## 2017-07-04 DIAGNOSIS — E785 Hyperlipidemia, unspecified: Secondary | ICD-10-CM | POA: Insufficient documentation

## 2017-07-04 DIAGNOSIS — R5381 Other malaise: Secondary | ICD-10-CM | POA: Diagnosis not present

## 2017-07-04 DIAGNOSIS — I5189 Other ill-defined heart diseases: Secondary | ICD-10-CM

## 2017-07-04 DIAGNOSIS — I5030 Unspecified diastolic (congestive) heart failure: Secondary | ICD-10-CM

## 2017-07-04 DIAGNOSIS — Z87891 Personal history of nicotine dependence: Secondary | ICD-10-CM | POA: Diagnosis not present

## 2017-07-04 HISTORY — DX: Unspecified diastolic (congestive) heart failure: I50.30

## 2017-07-04 HISTORY — DX: Other malaise: R53.81

## 2017-07-04 HISTORY — DX: Simple chronic bronchitis: J41.0

## 2017-07-04 LAB — BASIC METABOLIC PANEL
BUN: 40 mg/dL — ABNORMAL HIGH (ref 6–23)
CO2: 27 meq/L (ref 19–32)
Calcium: 9.5 mg/dL (ref 8.4–10.5)
Chloride: 101 mEq/L (ref 96–112)
Creatinine, Ser: 1.62 mg/dL — ABNORMAL HIGH (ref 0.40–1.50)
GFR: 43.54 mL/min — ABNORMAL LOW (ref >60.00)
GLUCOSE: 105 mg/dL — AB (ref 70–99)
POTASSIUM: 3.9 meq/L (ref 3.5–5.1)
SODIUM: 138 meq/L (ref 135–145)

## 2017-07-04 MED ORDER — PERFLUTREN LIPID MICROSPHERE
1.0000 mL | INTRAVENOUS | Status: AC | PRN
  Administered 2017-07-04: 1 mL via INTRAVENOUS

## 2017-07-04 NOTE — Assessment & Plan Note (Signed)
Refer to pulmonary rehab Consider [place]

## 2017-07-04 NOTE — Assessment & Plan Note (Signed)
COPD mix  IgE WNL Pt to provide sputum specimen when able

## 2017-07-04 NOTE — Assessment & Plan Note (Addendum)
Grade 1 diastolic HF per Echo 06/7668 06/26/2017>>BNP= 68 Plan Reviewed Echo results BMET today to check potassium>> Bump in creatinine to 1.62 Check with Dr. Lake Bells about cards referral vs low dose lasix at intervals Encouraged daily weights Encourages low salt diet. Follow up in 3 months with Dr. Lake Bells  Please contact office for sooner follow up if symptoms do not improve or worsen or seek emergency care

## 2017-07-04 NOTE — Patient Instructions (Addendum)
It is good to see you today. We will do some labs today. ( BMET) We will call you with the results I will talk with Dr. Lake Bells about Pulmonary Rehab and Cardiology consult vs. Low maintenance lasix./ PFT's Continue Trelegy and neb treatments as you have been doing. Rinse mouth after use Bring in sputum specimen when able. Follow up with Dr. Lake Bells in 3 month to make sure you are still doing well. Please contact office for sooner follow up if symptoms do not improve or worsen or seek emergency care

## 2017-07-05 ENCOUNTER — Other Ambulatory Visit: Payer: Self-pay

## 2017-07-05 DIAGNOSIS — I5189 Other ill-defined heart diseases: Secondary | ICD-10-CM

## 2017-07-05 DIAGNOSIS — I519 Heart disease, unspecified: Secondary | ICD-10-CM

## 2017-07-09 DIAGNOSIS — J449 Chronic obstructive pulmonary disease, unspecified: Secondary | ICD-10-CM | POA: Diagnosis not present

## 2017-07-09 NOTE — Progress Notes (Signed)
Was able to talk to the patient's wife (DPR) regarding their results.  They verbalized an understanding of what was discussed. No further questions at this time.

## 2017-07-13 ENCOUNTER — Other Ambulatory Visit: Payer: Self-pay

## 2017-07-13 DIAGNOSIS — I5189 Other ill-defined heart diseases: Secondary | ICD-10-CM

## 2017-07-13 DIAGNOSIS — R0609 Other forms of dyspnea: Secondary | ICD-10-CM

## 2017-07-26 DIAGNOSIS — H35373 Puckering of macula, bilateral: Secondary | ICD-10-CM | POA: Diagnosis not present

## 2017-07-26 DIAGNOSIS — H26493 Other secondary cataract, bilateral: Secondary | ICD-10-CM | POA: Diagnosis not present

## 2017-07-26 DIAGNOSIS — H04123 Dry eye syndrome of bilateral lacrimal glands: Secondary | ICD-10-CM | POA: Diagnosis not present

## 2017-08-09 DIAGNOSIS — J449 Chronic obstructive pulmonary disease, unspecified: Secondary | ICD-10-CM | POA: Diagnosis not present

## 2017-09-03 DIAGNOSIS — E782 Mixed hyperlipidemia: Secondary | ICD-10-CM | POA: Diagnosis not present

## 2017-09-03 DIAGNOSIS — E559 Vitamin D deficiency, unspecified: Secondary | ICD-10-CM | POA: Diagnosis not present

## 2017-09-03 DIAGNOSIS — I10 Essential (primary) hypertension: Secondary | ICD-10-CM | POA: Diagnosis not present

## 2017-09-03 DIAGNOSIS — J449 Chronic obstructive pulmonary disease, unspecified: Secondary | ICD-10-CM | POA: Diagnosis not present

## 2017-09-03 DIAGNOSIS — I5030 Unspecified diastolic (congestive) heart failure: Secondary | ICD-10-CM | POA: Diagnosis not present

## 2017-09-08 DIAGNOSIS — J449 Chronic obstructive pulmonary disease, unspecified: Secondary | ICD-10-CM | POA: Diagnosis not present

## 2017-09-21 DIAGNOSIS — J449 Chronic obstructive pulmonary disease, unspecified: Secondary | ICD-10-CM | POA: Diagnosis not present

## 2017-10-01 DIAGNOSIS — J449 Chronic obstructive pulmonary disease, unspecified: Secondary | ICD-10-CM | POA: Diagnosis not present

## 2017-10-08 DIAGNOSIS — M25511 Pain in right shoulder: Secondary | ICD-10-CM | POA: Diagnosis not present

## 2017-10-09 DIAGNOSIS — J449 Chronic obstructive pulmonary disease, unspecified: Secondary | ICD-10-CM | POA: Diagnosis not present

## 2017-11-06 DIAGNOSIS — D485 Neoplasm of uncertain behavior of skin: Secondary | ICD-10-CM | POA: Diagnosis not present

## 2017-11-06 DIAGNOSIS — L578 Other skin changes due to chronic exposure to nonionizing radiation: Secondary | ICD-10-CM | POA: Diagnosis not present

## 2017-11-06 DIAGNOSIS — Z85820 Personal history of malignant melanoma of skin: Secondary | ICD-10-CM | POA: Diagnosis not present

## 2017-11-06 DIAGNOSIS — L570 Actinic keratosis: Secondary | ICD-10-CM | POA: Diagnosis not present

## 2017-11-06 DIAGNOSIS — L821 Other seborrheic keratosis: Secondary | ICD-10-CM | POA: Diagnosis not present

## 2017-11-06 DIAGNOSIS — C44229 Squamous cell carcinoma of skin of left ear and external auricular canal: Secondary | ICD-10-CM | POA: Diagnosis not present

## 2017-11-08 DIAGNOSIS — J449 Chronic obstructive pulmonary disease, unspecified: Secondary | ICD-10-CM | POA: Diagnosis not present

## 2017-11-12 DIAGNOSIS — R195 Other fecal abnormalities: Secondary | ICD-10-CM | POA: Diagnosis not present

## 2017-11-12 DIAGNOSIS — Z8673 Personal history of transient ischemic attack (TIA), and cerebral infarction without residual deficits: Secondary | ICD-10-CM | POA: Diagnosis not present

## 2017-11-12 DIAGNOSIS — K921 Melena: Secondary | ICD-10-CM | POA: Diagnosis not present

## 2017-11-12 DIAGNOSIS — I10 Essential (primary) hypertension: Secondary | ICD-10-CM | POA: Diagnosis not present

## 2017-11-12 DIAGNOSIS — J449 Chronic obstructive pulmonary disease, unspecified: Secondary | ICD-10-CM | POA: Diagnosis not present

## 2017-12-09 DIAGNOSIS — J449 Chronic obstructive pulmonary disease, unspecified: Secondary | ICD-10-CM | POA: Diagnosis not present

## 2017-12-20 DIAGNOSIS — I10 Essential (primary) hypertension: Secondary | ICD-10-CM | POA: Diagnosis not present

## 2017-12-20 DIAGNOSIS — E559 Vitamin D deficiency, unspecified: Secondary | ICD-10-CM | POA: Diagnosis not present

## 2017-12-20 DIAGNOSIS — J449 Chronic obstructive pulmonary disease, unspecified: Secondary | ICD-10-CM | POA: Diagnosis not present

## 2017-12-20 DIAGNOSIS — R739 Hyperglycemia, unspecified: Secondary | ICD-10-CM | POA: Diagnosis not present

## 2017-12-20 DIAGNOSIS — G2581 Restless legs syndrome: Secondary | ICD-10-CM | POA: Diagnosis not present

## 2017-12-20 DIAGNOSIS — K529 Noninfective gastroenteritis and colitis, unspecified: Secondary | ICD-10-CM | POA: Diagnosis not present

## 2017-12-25 DIAGNOSIS — J449 Chronic obstructive pulmonary disease, unspecified: Secondary | ICD-10-CM | POA: Diagnosis not present

## 2017-12-25 DIAGNOSIS — Z8673 Personal history of transient ischemic attack (TIA), and cerebral infarction without residual deficits: Secondary | ICD-10-CM | POA: Diagnosis not present

## 2017-12-25 DIAGNOSIS — Z0000 Encounter for general adult medical examination without abnormal findings: Secondary | ICD-10-CM | POA: Diagnosis not present

## 2017-12-25 DIAGNOSIS — Z87891 Personal history of nicotine dependence: Secondary | ICD-10-CM | POA: Diagnosis not present

## 2017-12-25 DIAGNOSIS — Z139 Encounter for screening, unspecified: Secondary | ICD-10-CM | POA: Diagnosis not present

## 2017-12-25 DIAGNOSIS — K529 Noninfective gastroenteritis and colitis, unspecified: Secondary | ICD-10-CM | POA: Diagnosis not present

## 2017-12-25 DIAGNOSIS — I110 Hypertensive heart disease with heart failure: Secondary | ICD-10-CM | POA: Diagnosis not present

## 2017-12-25 DIAGNOSIS — R7303 Prediabetes: Secondary | ICD-10-CM | POA: Diagnosis not present

## 2017-12-25 DIAGNOSIS — I5030 Unspecified diastolic (congestive) heart failure: Secondary | ICD-10-CM | POA: Diagnosis not present

## 2017-12-25 DIAGNOSIS — Z85820 Personal history of malignant melanoma of skin: Secondary | ICD-10-CM | POA: Diagnosis not present

## 2017-12-25 DIAGNOSIS — E559 Vitamin D deficiency, unspecified: Secondary | ICD-10-CM | POA: Diagnosis not present

## 2017-12-25 DIAGNOSIS — R296 Repeated falls: Secondary | ICD-10-CM | POA: Diagnosis not present

## 2017-12-25 DIAGNOSIS — E785 Hyperlipidemia, unspecified: Secondary | ICD-10-CM | POA: Diagnosis not present

## 2017-12-25 DIAGNOSIS — Z9181 History of falling: Secondary | ICD-10-CM | POA: Diagnosis not present

## 2018-01-02 DIAGNOSIS — J449 Chronic obstructive pulmonary disease, unspecified: Secondary | ICD-10-CM | POA: Diagnosis not present

## 2018-01-02 DIAGNOSIS — Z8673 Personal history of transient ischemic attack (TIA), and cerebral infarction without residual deficits: Secondary | ICD-10-CM | POA: Diagnosis not present

## 2018-01-02 DIAGNOSIS — E559 Vitamin D deficiency, unspecified: Secondary | ICD-10-CM | POA: Diagnosis not present

## 2018-01-02 DIAGNOSIS — I110 Hypertensive heart disease with heart failure: Secondary | ICD-10-CM | POA: Diagnosis not present

## 2018-01-02 DIAGNOSIS — Z85820 Personal history of malignant melanoma of skin: Secondary | ICD-10-CM | POA: Diagnosis not present

## 2018-01-02 DIAGNOSIS — Z9181 History of falling: Secondary | ICD-10-CM | POA: Diagnosis not present

## 2018-01-02 DIAGNOSIS — R296 Repeated falls: Secondary | ICD-10-CM | POA: Diagnosis not present

## 2018-01-02 DIAGNOSIS — Z87891 Personal history of nicotine dependence: Secondary | ICD-10-CM | POA: Diagnosis not present

## 2018-01-02 DIAGNOSIS — R7303 Prediabetes: Secondary | ICD-10-CM | POA: Diagnosis not present

## 2018-01-02 DIAGNOSIS — I5030 Unspecified diastolic (congestive) heart failure: Secondary | ICD-10-CM | POA: Diagnosis not present

## 2018-01-07 DIAGNOSIS — K529 Noninfective gastroenteritis and colitis, unspecified: Secondary | ICD-10-CM

## 2018-01-07 DIAGNOSIS — E559 Vitamin D deficiency, unspecified: Secondary | ICD-10-CM | POA: Insufficient documentation

## 2018-01-07 DIAGNOSIS — W57XXXA Bitten or stung by nonvenomous insect and other nonvenomous arthropods, initial encounter: Secondary | ICD-10-CM | POA: Insufficient documentation

## 2018-01-07 HISTORY — DX: Vitamin D deficiency, unspecified: E55.9

## 2018-01-07 HISTORY — DX: Noninfective gastroenteritis and colitis, unspecified: K52.9

## 2018-01-08 DIAGNOSIS — Z8673 Personal history of transient ischemic attack (TIA), and cerebral infarction without residual deficits: Secondary | ICD-10-CM | POA: Diagnosis not present

## 2018-01-08 DIAGNOSIS — I5030 Unspecified diastolic (congestive) heart failure: Secondary | ICD-10-CM | POA: Diagnosis not present

## 2018-01-08 DIAGNOSIS — Z9181 History of falling: Secondary | ICD-10-CM | POA: Diagnosis not present

## 2018-01-08 DIAGNOSIS — R7303 Prediabetes: Secondary | ICD-10-CM | POA: Diagnosis not present

## 2018-01-08 DIAGNOSIS — Z85820 Personal history of malignant melanoma of skin: Secondary | ICD-10-CM | POA: Diagnosis not present

## 2018-01-08 DIAGNOSIS — J449 Chronic obstructive pulmonary disease, unspecified: Secondary | ICD-10-CM | POA: Diagnosis not present

## 2018-01-08 DIAGNOSIS — R296 Repeated falls: Secondary | ICD-10-CM | POA: Diagnosis not present

## 2018-01-08 DIAGNOSIS — Z87891 Personal history of nicotine dependence: Secondary | ICD-10-CM | POA: Diagnosis not present

## 2018-01-08 DIAGNOSIS — I110 Hypertensive heart disease with heart failure: Secondary | ICD-10-CM | POA: Diagnosis not present

## 2018-01-08 DIAGNOSIS — E559 Vitamin D deficiency, unspecified: Secondary | ICD-10-CM | POA: Diagnosis not present

## 2018-01-09 DIAGNOSIS — J449 Chronic obstructive pulmonary disease, unspecified: Secondary | ICD-10-CM | POA: Diagnosis not present

## 2018-01-14 DIAGNOSIS — R296 Repeated falls: Secondary | ICD-10-CM | POA: Diagnosis not present

## 2018-01-14 DIAGNOSIS — Z87891 Personal history of nicotine dependence: Secondary | ICD-10-CM | POA: Diagnosis not present

## 2018-01-14 DIAGNOSIS — Z8673 Personal history of transient ischemic attack (TIA), and cerebral infarction without residual deficits: Secondary | ICD-10-CM | POA: Diagnosis not present

## 2018-01-14 DIAGNOSIS — Z9181 History of falling: Secondary | ICD-10-CM | POA: Diagnosis not present

## 2018-01-14 DIAGNOSIS — J449 Chronic obstructive pulmonary disease, unspecified: Secondary | ICD-10-CM | POA: Diagnosis not present

## 2018-01-14 DIAGNOSIS — I5030 Unspecified diastolic (congestive) heart failure: Secondary | ICD-10-CM | POA: Diagnosis not present

## 2018-01-14 DIAGNOSIS — I110 Hypertensive heart disease with heart failure: Secondary | ICD-10-CM | POA: Diagnosis not present

## 2018-01-14 DIAGNOSIS — E559 Vitamin D deficiency, unspecified: Secondary | ICD-10-CM | POA: Diagnosis not present

## 2018-01-14 DIAGNOSIS — Z85820 Personal history of malignant melanoma of skin: Secondary | ICD-10-CM | POA: Diagnosis not present

## 2018-01-14 DIAGNOSIS — R7303 Prediabetes: Secondary | ICD-10-CM | POA: Diagnosis not present

## 2018-01-23 DIAGNOSIS — R269 Unspecified abnormalities of gait and mobility: Secondary | ICD-10-CM | POA: Diagnosis not present

## 2018-01-23 DIAGNOSIS — R531 Weakness: Secondary | ICD-10-CM | POA: Diagnosis not present

## 2018-01-24 DIAGNOSIS — Z8673 Personal history of transient ischemic attack (TIA), and cerebral infarction without residual deficits: Secondary | ICD-10-CM | POA: Diagnosis not present

## 2018-01-24 DIAGNOSIS — I5030 Unspecified diastolic (congestive) heart failure: Secondary | ICD-10-CM | POA: Diagnosis not present

## 2018-01-24 DIAGNOSIS — J449 Chronic obstructive pulmonary disease, unspecified: Secondary | ICD-10-CM | POA: Diagnosis not present

## 2018-01-24 DIAGNOSIS — Z87891 Personal history of nicotine dependence: Secondary | ICD-10-CM | POA: Diagnosis not present

## 2018-01-24 DIAGNOSIS — Z85820 Personal history of malignant melanoma of skin: Secondary | ICD-10-CM | POA: Diagnosis not present

## 2018-01-24 DIAGNOSIS — E559 Vitamin D deficiency, unspecified: Secondary | ICD-10-CM | POA: Diagnosis not present

## 2018-01-24 DIAGNOSIS — R7303 Prediabetes: Secondary | ICD-10-CM | POA: Diagnosis not present

## 2018-01-24 DIAGNOSIS — R296 Repeated falls: Secondary | ICD-10-CM | POA: Diagnosis not present

## 2018-01-24 DIAGNOSIS — I110 Hypertensive heart disease with heart failure: Secondary | ICD-10-CM | POA: Diagnosis not present

## 2018-01-24 DIAGNOSIS — Z9181 History of falling: Secondary | ICD-10-CM | POA: Diagnosis not present

## 2018-01-28 DIAGNOSIS — E559 Vitamin D deficiency, unspecified: Secondary | ICD-10-CM | POA: Diagnosis not present

## 2018-01-28 DIAGNOSIS — R296 Repeated falls: Secondary | ICD-10-CM | POA: Diagnosis not present

## 2018-01-28 DIAGNOSIS — Z85820 Personal history of malignant melanoma of skin: Secondary | ICD-10-CM | POA: Diagnosis not present

## 2018-01-28 DIAGNOSIS — Z87891 Personal history of nicotine dependence: Secondary | ICD-10-CM | POA: Diagnosis not present

## 2018-01-28 DIAGNOSIS — I110 Hypertensive heart disease with heart failure: Secondary | ICD-10-CM | POA: Diagnosis not present

## 2018-01-28 DIAGNOSIS — J449 Chronic obstructive pulmonary disease, unspecified: Secondary | ICD-10-CM | POA: Diagnosis not present

## 2018-01-28 DIAGNOSIS — Z9181 History of falling: Secondary | ICD-10-CM | POA: Diagnosis not present

## 2018-01-28 DIAGNOSIS — I5030 Unspecified diastolic (congestive) heart failure: Secondary | ICD-10-CM | POA: Diagnosis not present

## 2018-01-28 DIAGNOSIS — R7303 Prediabetes: Secondary | ICD-10-CM | POA: Diagnosis not present

## 2018-01-28 DIAGNOSIS — Z8673 Personal history of transient ischemic attack (TIA), and cerebral infarction without residual deficits: Secondary | ICD-10-CM | POA: Diagnosis not present

## 2018-01-31 DIAGNOSIS — I7789 Other specified disorders of arteries and arterioles: Secondary | ICD-10-CM | POA: Insufficient documentation

## 2018-01-31 HISTORY — DX: Other specified disorders of arteries and arterioles: I77.89

## 2018-01-31 NOTE — Progress Notes (Signed)
Cardiology Office Note:    Date:  02/01/2018   ID:  Kevin Keel., DOB Sep 23, 1932, MRN [id number]  PCP:  Melony Overly, MD  Cardiologist:  Shirlee More, MD   Referring MD: Renaldo Reel, [place]  ASSESSMENT:    1. Chronic diastolic congestive heart failure ([place])   2. Enlarged thoracic aorta ([place])   3. Hypertensive heart disease with heart failure ([place])   4. Chronic obstructive pulmonary disease, unspecified COPD type ([place])    PLAN:    In order of problems listed above:  1. I am unsure whether this man has congestive heart failure is diastolic dysfunction can be physiologic with age he has no evidence of fluid overload physical exam is more consistent with chronic lung disease and his proBNP level was quite low.  At this time he is not on a diuretic I would not resume when I asked patient and his wife if they have a proBNP level repeated and a CT of the chest to look for fibrosis as well as for thoracic aortic dimensions.  He will return in 1 month to review the results 2. Mild enlargement a sending aorta CT scan to define dimensions 3. Blood pressure stable at target continue current treatment beta-blocker ARB. 4. I suspect COPD is the predominant problem physical examination suggest pulmonary fibrosis spirometry was normal however CT of the chest performed and follow-up with his pulmonologist  Next appointment 1 month   Medication Adjustments/Labs and Tests Ordered: Current medicines are reviewed at length with the patient today.  Concerns regarding medicines are outlined above.  No orders of the defined types were placed in this encounter.  No orders of the defined types were placed in this encounter.    Chief Complaint  Patient presents with  . Congestive Heart Failure    History of Present Illness:    Kevin Blew. is a 84 y.o. male with COPD who is being seen today for the evaluation of heart failure at the request of Renaldo Reel, [place].  Echocardiogram  performed 07/04/2017 shows mild concentric LVH ejection fraction 60 to 65% with elevated left atrial pressure and the ascending aorta was dilated 38 mm and  lipomatous hypertrophy of the atrial septum.  He had a GI bleed with gastric ulcer 2017 transfused and last Hgb 8.0 at discharge.  He has no known history of heart disease although the patient is wife said the word heart failure is been used in the past.  He briefly took a diuretic for a week and had no improvement.  Presently he has no edema he is short of breath with more than mild activities no orthopnea chest pain palpitation or syncope. Past Medical History:  Diagnosis Date  . Actinic keratosis   . Anemia   . Basal cell carcinoma   . COPD (chronic obstructive pulmonary disease) ([place])   . Diastolic heart failure ([place])   . HDL lipoprotein deficiency   . History of stroke   . Hyperglycemia   . Hyperlipidemia   . Hypertension   . Prediabetes   . Restless leg syndrome     Past Surgical History:  Procedure Laterality Date  . CATARACT EXTRACTION    . HEMORROIDECTOMY    . MELANOMA EXCISION    . NASAL SEPTUM SURGERY    . PROSTATE SURGERY    . VASECTOMY      Current Medications: Current Meds  Medication Sig  . albuterol (PROVENTIL) (2.5 MG/3ML) 0.083% nebulizer solution Take 2.5  mg by nebulization every 6 (six) hours as needed for wheezing or shortness of breath.  . cholecalciferol (VITAMIN D) 1000 units tablet Take 1,000 Units by mouth daily.  . clopidogrel (PLAVIX) 75 MG tablet Take 75 mg by mouth daily.  . Coenzyme Q10 (COQ-10) 100 MG CAPS Take 1 capsule by mouth daily.  Marland Kitchen donepezil (ARICEPT) 10 MG tablet Take 10 mg by mouth at bedtime.  Marland Kitchen escitalopram (LEXAPRO) 20 MG tablet Take 20 mg by mouth daily.   . Fluticasone-Umeclidin-Vilant (TRELEGY ELLIPTA) 100-62.5-25 MCG/INH AEPB Inhale 1 puff into the lungs daily.  . Loperamide HCl (IMODIUM PO) Take 1 tablet by mouth daily as needed.  . memantine (NAMENDA) 10 MG tablet Take  10 mg by mouth daily.  . metoprolol succinate (TOPROL-XL) 25 MG 24 hr tablet Take 25 mg by mouth daily.  Marland Kitchen spironolactone (ALDACTONE) 25 MG tablet Take 25 mg by mouth daily.  Marland Kitchen terazosin (HYTRIN) 5 MG capsule Take 5 mg by mouth at bedtime.  . valsartan (DIOVAN) 320 MG tablet Take 80 mg by mouth daily.      Allergies:   Shellfish allergy; Codeine; and Lisinopril   Social History   Socioeconomic History  . Marital status: Married    Spouse name: Not on file  . Number of children: Not on file  . Years of education: Not on file  . Highest education level: Not on file  Occupational History  . Not on file  Social Needs  . Financial resource strain: Not on file  . Food insecurity:    Worry: Not on file    Inability: Not on file  . Transportation needs:    Medical: Not on file    Non-medical: Not on file  Tobacco Use  . Smoking status: Former Smoker    Packs/day: 0.50    Years: 30.00    Pack years: 15.00    Types: Cigarettes    Last attempt to quit: 05/01/1981    Years since quitting: 36.7  . Smokeless tobacco: Former Systems developer    Types: Chew  Substance and Sexual Activity  . Alcohol use: Not Currently    Frequency: Never  . Drug use: Never  . Sexual activity: Not on file  Lifestyle  . Physical activity:    Days per week: Not on file    Minutes per session: Not on file  . Stress: Not on file  Relationships  . Social connections:    Talks on phone: Not on file    Gets together: Not on file    Attends religious service: Not on file    Active member of club or organization: Not on file    Attends meetings of clubs or organizations: Not on file    Relationship status: Not on file  Other Topics Concern  . Not on file  Social History Narrative  . Not on file     Family History: The patient's family history includes Asthma in his brother; CVA in his mother; Heart attack in his brother, father, and mother; Hyperlipidemia in his brother; Hypertension in his brother, father, and  mother; Stroke in his father and mother.  ROS:   Review of Systems  Constitution: Negative.  HENT: Negative.   Eyes: Negative.   Cardiovascular: Positive for dyspnea on exertion and leg swelling (at times).  Respiratory: Positive for cough, shortness of breath, sputum production and wheezing.   Endocrine: Negative.   Hematologic/Lymphatic: Negative.   Skin: Negative.   Musculoskeletal: Positive for joint pain.  Gastrointestinal:  Negative.   Genitourinary: Negative.   Neurological: Negative.   Psychiatric/Behavioral: Negative.   Allergic/Immunologic: Negative.    Please see the history of present illness.     All other systems reviewed and are negative.  EKGs/Labs/Other Studies Reviewed:    The following studies were reviewed today:   EKG:  EKG is  ordered today.  The ekg ordered today demonstrates sinus rhythm and normal  Chest x-ray 06/26/2017 showed mild chronic bronchitic changes with atelectasis or scarring  Recent Labs: 06/26/2017: Hemoglobin 14.1; Platelets 245.0; Pro B Natriuretic peptide (BNP) 68.0 07/04/2017: BUN 40; Creatinine, Ser 1.62; Potassium 3.9; Sodium 138  Recent Lipid Panel No results found for: CHOL, TRIG, HDL, CHOLHDL, VLDL, LDLCALC, LDLDIRECT  Physical Exam:    VS:  Ht 5\' 3"  (1.6 m)   Wt 166 lb (75.3 kg)   BMI 29.41 kg/m     Wt Readings from Last 3 Encounters:  02/01/18 166 lb (75.3 kg)  07/04/17 161 lb 9.6 oz (73.3 kg)  06/26/17 176 lb (79.8 kg)     GEN:  Well nourished, well developed in no acute distress HEENT: Normal NECK: No JVD; No carotid bruits LYMPHATICS: No lymphadenopathy CARDIAC: There is no gallop RRR, no murmurs, rubs, gallops RESPIRATORY:  Clear with basilar fibrotic rales, wheezing or rhonchi  ABDOMEN: Soft, non-tender, non-distended MUSCULOSKELETAL:  No edema; No deformity  SKIN: Warm and dry NEUROLOGIC:  Alert and oriented x 3 PSYCHIATRIC:  Normal affect     Signed, Shirlee More, MD  02/01/2018 9:08 AM    [facility]

## 2018-02-01 ENCOUNTER — Encounter: Payer: Self-pay | Admitting: Cardiology

## 2018-02-01 ENCOUNTER — Ambulatory Visit (INDEPENDENT_AMBULATORY_CARE_PROVIDER_SITE_OTHER): Payer: Medicare Other | Admitting: Cardiology

## 2018-02-01 VITALS — BP 126/74 | HR 63 | Ht 63.0 in | Wt 166.0 lb

## 2018-02-01 DIAGNOSIS — I119 Hypertensive heart disease without heart failure: Secondary | ICD-10-CM

## 2018-02-01 DIAGNOSIS — I712 Thoracic aortic aneurysm, without rupture, unspecified: Secondary | ICD-10-CM

## 2018-02-01 DIAGNOSIS — J449 Chronic obstructive pulmonary disease, unspecified: Secondary | ICD-10-CM

## 2018-02-01 DIAGNOSIS — I5032 Chronic diastolic (congestive) heart failure: Secondary | ICD-10-CM

## 2018-02-01 DIAGNOSIS — N189 Chronic kidney disease, unspecified: Secondary | ICD-10-CM

## 2018-02-01 DIAGNOSIS — I10 Essential (primary) hypertension: Secondary | ICD-10-CM | POA: Diagnosis not present

## 2018-02-01 DIAGNOSIS — I7789 Other specified disorders of arteries and arterioles: Secondary | ICD-10-CM | POA: Diagnosis not present

## 2018-02-01 DIAGNOSIS — Z23 Encounter for immunization: Secondary | ICD-10-CM | POA: Diagnosis not present

## 2018-02-01 DIAGNOSIS — R531 Weakness: Secondary | ICD-10-CM | POA: Diagnosis not present

## 2018-02-01 HISTORY — DX: Chronic kidney disease, unspecified: N18.9

## 2018-02-01 LAB — PRO B NATRIURETIC PEPTIDE: NT-Pro BNP: 237 pg/mL (ref 0–486)

## 2018-02-01 NOTE — Patient Instructions (Signed)
Medication Instructions:  Your physician recommends that you continue on your current medications as directed. Please refer to the Current Medication list given to you today.   Labwork: Your physician recommends that you return for lab work today: [place].   Testing/Procedures: Non-Cardiac CT scanning, (CAT scanning), is a noninvasive, special x-ray that produces cross-sectional images of the body using x-rays and a computer. CT scans help physicians diagnose and treat medical conditions. For some CT exams, a contrast material is used to enhance visibility in the area of the body being studied. CT scans provide greater clarity and reveal more details than regular x-ray exams.   Follow-Up: Your physician recommends that you schedule a follow-up appointment in: 1 month.  If you need a refill on your cardiac medications before your next appointment, please call your pharmacy.   Thank you for choosing [facility]! Robyne Peers, RN [phone]    CT Scan A CT scan is a kind of X-ray. A CT scan makes pictures of the inside of your body. In this procedure, the pictures will be taken in a large machine that has an opening (CT scanner). What happens before the procedure? Staying hydrated Follow instructions from your doctor about hydration, which may include:  Up to 2 hours before the procedure - you may continue to drink clear liquids. These include water, clear fruit juice, black coffee, and plain tea.  Eating and drinking restrictions Follow instructions from your doctor about eating and drinking, which may include:  24 hours before the procedure - stop drinking caffeinated drinks. These include energy drinks, tea, soda, coffee, and hot chocolate.  8 hours before the procedure - stop eating heavy meals or foods. These include meat, fried foods, or fatty foods.  6 hours before the procedure - stop eating light meals or foods. These include toast or cereal.  6 hours before  the procedure - stop drinking milk or drinks that have milk in them.  2 hours before the procedure - stop drinking clear liquids.  General instructions  Take off any jewelry.  Ask your doctor about changing or stopping your normal medicines. This is important if you take diabetes medicines or blood thinners. What happens during the procedure?  You will lie on a table with your arms above your head.  An IV tube may be put into one of your veins.  Dye may be put into the IV tube. You may feel warm or have a metal taste in your mouth.  The table you will be lying on will move into the CT scanner.  You will be able to see, hear, and talk to the person who is running the machine while you are in it. Follow that person's directions.  The machine will move around you to take pictures. Do not move.  When the machine is done taking pictures, it will be turned off.  The table will be moved out of the machine.  Your IV tube will be taken out. The procedure may vary among doctors and hospitals. What happens after the procedure?  It is up to you to get your results. Ask when your results will be ready. Summary  A CT scan is a kind of X-ray.  A CT scan makes pictures of the inside of your body.  Follow instructions from your doctor about eating and drinking before the procedure.  You will be able to see, hear, and talk to the person who is running the machine while you are in it. Follow that  person's directions. This information is not intended to replace advice given to you by your health care provider. Make sure you discuss any questions you have with your health care provider. Document Released: 07/14/2008 Document Revised: 05/04/2016 Document Reviewed: 05/04/2016 Elsevier Interactive Patient Education  2017 [employer].

## 2018-02-04 ENCOUNTER — Encounter: Payer: Self-pay | Admitting: Cardiology

## 2018-02-05 DIAGNOSIS — Z9181 History of falling: Secondary | ICD-10-CM | POA: Diagnosis not present

## 2018-02-05 DIAGNOSIS — Z87891 Personal history of nicotine dependence: Secondary | ICD-10-CM | POA: Diagnosis not present

## 2018-02-05 DIAGNOSIS — E559 Vitamin D deficiency, unspecified: Secondary | ICD-10-CM | POA: Diagnosis not present

## 2018-02-05 DIAGNOSIS — I110 Hypertensive heart disease with heart failure: Secondary | ICD-10-CM | POA: Diagnosis not present

## 2018-02-05 DIAGNOSIS — R296 Repeated falls: Secondary | ICD-10-CM | POA: Diagnosis not present

## 2018-02-05 DIAGNOSIS — Z85820 Personal history of malignant melanoma of skin: Secondary | ICD-10-CM | POA: Diagnosis not present

## 2018-02-05 DIAGNOSIS — I5030 Unspecified diastolic (congestive) heart failure: Secondary | ICD-10-CM | POA: Diagnosis not present

## 2018-02-05 DIAGNOSIS — R7303 Prediabetes: Secondary | ICD-10-CM | POA: Diagnosis not present

## 2018-02-05 DIAGNOSIS — J449 Chronic obstructive pulmonary disease, unspecified: Secondary | ICD-10-CM | POA: Diagnosis not present

## 2018-02-05 DIAGNOSIS — Z8673 Personal history of transient ischemic attack (TIA), and cerebral infarction without residual deficits: Secondary | ICD-10-CM | POA: Diagnosis not present

## 2018-02-06 DIAGNOSIS — J449 Chronic obstructive pulmonary disease, unspecified: Secondary | ICD-10-CM | POA: Diagnosis not present

## 2018-02-06 DIAGNOSIS — J8410 Pulmonary fibrosis, unspecified: Secondary | ICD-10-CM | POA: Diagnosis not present

## 2018-02-08 DIAGNOSIS — J449 Chronic obstructive pulmonary disease, unspecified: Secondary | ICD-10-CM | POA: Diagnosis not present

## 2018-02-11 DIAGNOSIS — R296 Repeated falls: Secondary | ICD-10-CM | POA: Diagnosis not present

## 2018-02-11 DIAGNOSIS — H35372 Puckering of macula, left eye: Secondary | ICD-10-CM | POA: Diagnosis not present

## 2018-02-11 DIAGNOSIS — Z8673 Personal history of transient ischemic attack (TIA), and cerebral infarction without residual deficits: Secondary | ICD-10-CM | POA: Diagnosis not present

## 2018-02-11 DIAGNOSIS — Z85820 Personal history of malignant melanoma of skin: Secondary | ICD-10-CM | POA: Diagnosis not present

## 2018-02-11 DIAGNOSIS — E559 Vitamin D deficiency, unspecified: Secondary | ICD-10-CM | POA: Diagnosis not present

## 2018-02-11 DIAGNOSIS — I110 Hypertensive heart disease with heart failure: Secondary | ICD-10-CM | POA: Diagnosis not present

## 2018-02-11 DIAGNOSIS — Z9181 History of falling: Secondary | ICD-10-CM | POA: Diagnosis not present

## 2018-02-11 DIAGNOSIS — R7303 Prediabetes: Secondary | ICD-10-CM | POA: Diagnosis not present

## 2018-02-11 DIAGNOSIS — I5030 Unspecified diastolic (congestive) heart failure: Secondary | ICD-10-CM | POA: Diagnosis not present

## 2018-02-11 DIAGNOSIS — J449 Chronic obstructive pulmonary disease, unspecified: Secondary | ICD-10-CM | POA: Diagnosis not present

## 2018-02-11 DIAGNOSIS — Z87891 Personal history of nicotine dependence: Secondary | ICD-10-CM | POA: Diagnosis not present

## 2018-02-11 DIAGNOSIS — H524 Presbyopia: Secondary | ICD-10-CM | POA: Diagnosis not present

## 2018-02-12 ENCOUNTER — Telehealth: Payer: Self-pay | Admitting: Pulmonary Disease

## 2018-02-12 NOTE — Telephone Encounter (Signed)
Spoke with pt's wife, Anne Ng. States that that she does not want to make an appointment at this time. The pt has a lot of upcoming doctors appointments and the pt wouldn't come even if an appointment was made. Pt refuses to participate in pulmonary rehab and Anne Ng feels that coming to our office would been pointless at this time. Advised her to let us know if they change their mind and we would be happy to schedule an appointment. She agreed. Nothing further was needed at this time.  Will route to Dr. Lake Bells to make him aware.

## 2018-02-13 NOTE — Telephone Encounter (Signed)
OK 

## 2018-02-18 DIAGNOSIS — Z87891 Personal history of nicotine dependence: Secondary | ICD-10-CM | POA: Diagnosis not present

## 2018-02-18 DIAGNOSIS — Z9181 History of falling: Secondary | ICD-10-CM | POA: Diagnosis not present

## 2018-02-18 DIAGNOSIS — I5030 Unspecified diastolic (congestive) heart failure: Secondary | ICD-10-CM | POA: Diagnosis not present

## 2018-02-18 DIAGNOSIS — Z85820 Personal history of malignant melanoma of skin: Secondary | ICD-10-CM | POA: Diagnosis not present

## 2018-02-18 DIAGNOSIS — R296 Repeated falls: Secondary | ICD-10-CM | POA: Diagnosis not present

## 2018-02-18 DIAGNOSIS — I110 Hypertensive heart disease with heart failure: Secondary | ICD-10-CM | POA: Diagnosis not present

## 2018-02-18 DIAGNOSIS — R7303 Prediabetes: Secondary | ICD-10-CM | POA: Diagnosis not present

## 2018-02-18 DIAGNOSIS — Z8673 Personal history of transient ischemic attack (TIA), and cerebral infarction without residual deficits: Secondary | ICD-10-CM | POA: Diagnosis not present

## 2018-02-18 DIAGNOSIS — J449 Chronic obstructive pulmonary disease, unspecified: Secondary | ICD-10-CM | POA: Diagnosis not present

## 2018-02-18 DIAGNOSIS — E559 Vitamin D deficiency, unspecified: Secondary | ICD-10-CM | POA: Diagnosis not present

## 2018-03-12 NOTE — Progress Notes (Signed)
Cardiology Office Note:    Date:  03/13/2018   ID:  [name]., DOB [birth year], MRN [id number]  PCP:  Renaldo Reel, PA  Cardiologist:  Shirlee More, MD    Referring MD: Renaldo Reel, PA    ASSESSMENT:    1. Chronic diastolic congestive heart failure ([place])   2. Hypertensive heart disease, unspecified whether heart failure present   3. Enlarged thoracic aorta ([facility])    PLAN:    In order of problems listed above:  1. Whatever element of heart failure had in the past looks well compensated he has no fluid overload and does not require loop diuretic.  Clinically I think his shortness of breath is pulmonary in etiology he has been seen previously by pulmonary and strongly encouraged to follow-up again as these individuals can require treatment for interstitial pneumonitis to prevent progression. 2. Stable blood pressure continue treatment beta-blocker spironolactone therapy 3. Need recheck CTA 1 year we will discuss with his PCP   Next appointment: PRn   Medication Adjustments/Labs and Tests Ordered: Current medicines are reviewed at length with the patient today.  Concerns regarding medicines are outlined above.  No orders of the defined types were placed in this encounter.  No orders of the defined types were placed in this encounter.   Chief Complaint  Patient presents with  . Follow-up  . Congestive Heart Failure    History of Present Illness:    Alberta Cairns. is a 84 y.o. male with a hx of heart failure last seen 02/01/18.  Echocardiogram performed 07/04/2017 shows mild concentric LVH ejection fraction 60 to 65% with elevated left atrial pressure and the ascending aorta was dilated 38 mm and  lipomatous hypertrophy of the atrial septum.   He had a GI bleed with gastric ulcer 2017 transfused and last Hgb 8.0 at discharge.   Ref Range & Units 40mo ago  NT-Pro BNP 0 - 486 pg/mL 237    CTA of the chest performed 02/06/2018 shows coronary artery calcification  dilation of ascending aorta 3.9 cm no pericardial effusion.  Lungs were abnormal with basilar pulmonary fibrosis with findings suspicious for usual interstitial pneumonitis versus nonspecific interstitial pneumonitis.  Comments included honeycombing traction bronchiectasis and groundglass appearance.  Compliance with diet, lifestyle and medications: Yes  He continues to be short of breath doing activities such as walking outdoors he has no edema orthopnea chest pain palpitation or syncope.  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  He is previously been seen by pulmonary and with his abnormal CTA and the question of interstitial pneumonitis I strongly encouraged him to be reevaluated.  At this time I do not think he has evidence of decompensated heart failure and I do not think his shortness of breath is cardiac in etiology.  I reviewed these findings with the patient as well as his daughter including his proBNP level which is in normal range echocardiogram which does not show elevated left atrial pressure and CTA that shows significant underlying lung disease dilation of the ascending aorta is not infrequent and likely will need a repeat CTA in about 1 year Past Medical History:  Diagnosis Date  . Actinic keratosis   . Anemia   . Basal cell carcinoma   . COPD (chronic obstructive pulmonary disease) ([place])   . Diastolic heart failure ([place])   . Gastric ulcer    from  06/06/15: ULCER/GASTRITIS/ANEMIA:  . HDL lipoprotein deficiency   . History of stroke   . Hyperglycemia   . Hyperlipidemia   . Hypertension   . Prediabetes   . Restless leg syndrome     Past Surgical History:  Procedure Laterality Date  . CATARACT EXTRACTION    . HEMORROIDECTOMY    . MELANOMA EXCISION    . NASAL SEPTUM SURGERY    . PROSTATE SURGERY    . VASECTOMY      Current Medications: Current Meds  Medication Sig  .  albuterol (PROVENTIL) (2.5 MG/3ML) 0.083% nebulizer solution Take 2.5 mg by nebulization every 6 (six) hours as needed for wheezing or shortness of breath.  . cholecalciferol (VITAMIN D) 1000 units tablet Take 1,000 Units by mouth daily.  . clopidogrel (PLAVIX) 75 MG tablet Take 75 mg by mouth daily.  . Coenzyme Q10 (COQ-10) 100 MG CAPS Take 1 capsule by mouth daily.  Marland Kitchen donepezil (ARICEPT) 10 MG tablet Take 10 mg by mouth at bedtime.  Marland Kitchen escitalopram (LEXAPRO) 20 MG tablet Take 20 mg by mouth daily.   . Fluticasone-Umeclidin-Vilant (TRELEGY ELLIPTA) 100-62.5-25 MCG/INH AEPB Inhale 1 puff into the lungs daily.  . Loperamide HCl (IMODIUM PO) Take 1 tablet by mouth daily as needed.  . memantine (NAMENDA) 10 MG tablet Take 10 mg by mouth daily.  . metoprolol succinate (TOPROL-XL) 25 MG 24 hr tablet Take 25 mg by mouth daily.  Marland Kitchen spironolactone (ALDACTONE) 25 MG tablet Take 25 mg by mouth daily.  Marland Kitchen terazosin (HYTRIN) 5 MG capsule Take 5 mg by mouth at bedtime.  . valsartan (DIOVAN) 80 MG tablet Take 80 mg by mouth daily.      Allergies:   Shellfish allergy; Codeine; and Lisinopril   Social History   Socioeconomic History  . Marital status: Married    Spouse name: Not on file  . Number of children: Not on file  . Years of education: Not on file  . Highest education level: Not on file  Occupational History  . Not on file  Social Needs  . Financial resource strain: Not on file  . Food insecurity:    Worry: Not on file    Inability: Not on file  . Transportation needs:    Medical: Not on file    Non-medical: Not on file  Tobacco Use  . Smoking status: Former Smoker    Packs/day: 0.50    Years: 30.00    Pack years: 15.00    Types: Cigarettes    Last attempt to quit: 05/01/1981    Years since quitting: 36.8  . Smokeless tobacco: Former Systems developer    Types: Chew  Substance and Sexual Activity  . Alcohol use: Not Currently    Frequency: Never  . Drug use: Never  . Sexual activity: Not on  file  Lifestyle  . Physical activity:    Days per week: Not on file    Minutes per session: Not on file  . Stress: Not on file  Relationships  . Social connections:    Talks on phone: Not on file    Gets together: Not on file    Attends religious service: Not on file    Active member of club or organization: Not on file    Attends meetings of clubs or organizations: Not on file    Relationship status: Not on file  Other Topics Concern  . Not on file  Social History Narrative  . Not on file     Family  History: The patient's family history includes Asthma in his brother; CVA in his mother; Heart attack in his brother, father, and mother; Hyperlipidemia in his brother; Hypertension in his brother, father, and mother; Stroke in his father and mother. ROS:   Please see the history of present illness.    All other systems reviewed and are negative.  EKGs/Labs/Other Studies Reviewed:    The following studies were reviewed today  Recent Labs: 06/26/2017: Hemoglobin 14.1; Platelets 245.0 07/04/2017: BUN 40; Creatinine, Ser 1.62; Potassium 3.9; Sodium 138 02/01/2018: NT-Pro BNP 237  Recent Lipid Panel No results found for: CHOL, TRIG, HDL, CHOLHDL, VLDL, LDLCALC, LDLDIRECT  Physical Exam:    VS:  BP 130/78 (BP Location: Right Arm, Patient Position: Sitting, Cuff Size: Normal)   Pulse (!) 54   Ht 5\' 3"  (1.6 m)   Wt 167 lb (75.8 kg)   SpO2 95%   BMI 29.58 kg/m     Wt Readings from Last 3 Encounters:  03/13/18 167 lb (75.8 kg)  02/01/18 166 lb (75.3 kg)  07/04/17 161 lb 9.6 oz (73.3 kg)     GEN:  Well nourished, well developed in no acute distress HEENT: Normal NECK: No JVD; No carotid bruits LYMPHATICS: No lymphadenopathy CARDIAC: RRR, no murmurs, rubs, gallops RESPIRATORY:  Clear to auscultation without rales, wheezing or rhonchi  ABDOMEN: Soft, non-tender, non-distended MUSCULOSKELETAL:  No edema; No deformity  SKIN: Warm and dry NEUROLOGIC:  Alert and oriented x  3 PSYCHIATRIC:  Normal affect    Signed, Shirlee More, MD  03/13/2018 11:58 AM    [place]

## 2018-03-13 ENCOUNTER — Ambulatory Visit: Payer: Medicare Other | Admitting: Cardiology

## 2018-03-13 ENCOUNTER — Encounter: Payer: Self-pay | Admitting: Cardiology

## 2018-03-13 VITALS — BP 130/78 | HR 54 | Ht 63.0 in | Wt 167.0 lb

## 2018-03-13 DIAGNOSIS — I5032 Chronic diastolic (congestive) heart failure: Secondary | ICD-10-CM | POA: Diagnosis not present

## 2018-03-13 DIAGNOSIS — I7789 Other specified disorders of arteries and arterioles: Secondary | ICD-10-CM

## 2018-03-13 DIAGNOSIS — I119 Hypertensive heart disease without heart failure: Secondary | ICD-10-CM | POA: Diagnosis not present

## 2018-03-13 NOTE — Patient Instructions (Signed)
Medication Instructions:  Your physician recommends that you continue on your current medications as directed. Please refer to the Current Medication list given to you today.  If you need a refill on your cardiac medications before your next appointment, please call your pharmacy.   Lab work: None  If you have labs (blood work) drawn today and your tests are completely normal, you will receive your results only by: Marland Kitchen MyChart Message (if you have MyChart) OR . A paper copy in the mail If you have any lab test that is abnormal or we need to change your treatment, we will call you to review the results.  Testing/Procedures: None  Follow-Up: At [facility], you and your health needs are our priority.  As part of our continuing mission to provide you with exceptional heart care, we have created designated Provider Care Teams.  These Care Teams include your primary Cardiologist (physician) and Advanced Practice Providers (APPs -  Physician Assistants and Nurse Practitioners) who all work together to provide you with the care you need, when you need it. You will need a follow up appointment as needed with Dr. Bettina Gavia if symptoms worsen or fail to improve.

## 2018-03-15 DIAGNOSIS — J449 Chronic obstructive pulmonary disease, unspecified: Secondary | ICD-10-CM | POA: Diagnosis not present

## 2018-03-15 DIAGNOSIS — E785 Hyperlipidemia, unspecified: Secondary | ICD-10-CM | POA: Diagnosis not present

## 2018-03-15 DIAGNOSIS — I10 Essential (primary) hypertension: Secondary | ICD-10-CM | POA: Diagnosis not present

## 2018-03-15 DIAGNOSIS — R7303 Prediabetes: Secondary | ICD-10-CM | POA: Diagnosis not present

## 2018-09-25 DIAGNOSIS — R531 Weakness: Secondary | ICD-10-CM | POA: Diagnosis not present

## 2018-09-25 DIAGNOSIS — R739 Hyperglycemia, unspecified: Secondary | ICD-10-CM | POA: Diagnosis not present

## 2018-09-25 DIAGNOSIS — R319 Hematuria, unspecified: Secondary | ICD-10-CM | POA: Diagnosis not present

## 2018-09-25 DIAGNOSIS — E559 Vitamin D deficiency, unspecified: Secondary | ICD-10-CM | POA: Diagnosis not present

## 2018-09-25 DIAGNOSIS — I10 Essential (primary) hypertension: Secondary | ICD-10-CM | POA: Diagnosis not present

## 2018-10-02 DIAGNOSIS — E538 Deficiency of other specified B group vitamins: Secondary | ICD-10-CM | POA: Diagnosis not present

## 2018-10-10 DIAGNOSIS — E538 Deficiency of other specified B group vitamins: Secondary | ICD-10-CM | POA: Diagnosis not present

## 2018-10-17 DIAGNOSIS — E538 Deficiency of other specified B group vitamins: Secondary | ICD-10-CM | POA: Diagnosis not present

## 2018-10-24 DIAGNOSIS — E538 Deficiency of other specified B group vitamins: Secondary | ICD-10-CM | POA: Diagnosis not present

## 2018-12-27 DIAGNOSIS — H35373 Puckering of macula, bilateral: Secondary | ICD-10-CM | POA: Diagnosis not present

## 2018-12-27 DIAGNOSIS — Z961 Presence of intraocular lens: Secondary | ICD-10-CM | POA: Diagnosis not present

## 2018-12-30 DIAGNOSIS — E785 Hyperlipidemia, unspecified: Secondary | ICD-10-CM | POA: Diagnosis not present

## 2018-12-30 DIAGNOSIS — Z139 Encounter for screening, unspecified: Secondary | ICD-10-CM | POA: Diagnosis not present

## 2018-12-30 DIAGNOSIS — Z0000 Encounter for general adult medical examination without abnormal findings: Secondary | ICD-10-CM | POA: Diagnosis not present

## 2018-12-30 DIAGNOSIS — Z9181 History of falling: Secondary | ICD-10-CM | POA: Diagnosis not present

## 2019-02-20 DIAGNOSIS — J449 Chronic obstructive pulmonary disease, unspecified: Secondary | ICD-10-CM | POA: Diagnosis not present

## 2019-03-28 DIAGNOSIS — I7789 Other specified disorders of arteries and arterioles: Secondary | ICD-10-CM | POA: Diagnosis not present

## 2019-03-28 DIAGNOSIS — E559 Vitamin D deficiency, unspecified: Secondary | ICD-10-CM | POA: Diagnosis not present

## 2019-03-28 DIAGNOSIS — R739 Hyperglycemia, unspecified: Secondary | ICD-10-CM | POA: Diagnosis not present

## 2019-03-28 DIAGNOSIS — R531 Weakness: Secondary | ICD-10-CM | POA: Diagnosis not present

## 2019-03-28 DIAGNOSIS — E782 Mixed hyperlipidemia: Secondary | ICD-10-CM | POA: Diagnosis not present

## 2019-03-28 DIAGNOSIS — I10 Essential (primary) hypertension: Secondary | ICD-10-CM | POA: Diagnosis not present

## 2019-04-01 DIAGNOSIS — Z85820 Personal history of malignant melanoma of skin: Secondary | ICD-10-CM | POA: Diagnosis not present

## 2019-04-01 DIAGNOSIS — I110 Hypertensive heart disease with heart failure: Secondary | ICD-10-CM | POA: Diagnosis not present

## 2019-04-01 DIAGNOSIS — Z87891 Personal history of nicotine dependence: Secondary | ICD-10-CM | POA: Diagnosis not present

## 2019-04-01 DIAGNOSIS — Z79899 Other long term (current) drug therapy: Secondary | ICD-10-CM | POA: Diagnosis not present

## 2019-04-01 DIAGNOSIS — Z8673 Personal history of transient ischemic attack (TIA), and cerebral infarction without residual deficits: Secondary | ICD-10-CM | POA: Diagnosis not present

## 2019-04-01 DIAGNOSIS — I5030 Unspecified diastolic (congestive) heart failure: Secondary | ICD-10-CM | POA: Diagnosis not present

## 2019-04-01 DIAGNOSIS — I7789 Other specified disorders of arteries and arterioles: Secondary | ICD-10-CM | POA: Diagnosis not present

## 2019-04-01 DIAGNOSIS — E782 Mixed hyperlipidemia: Secondary | ICD-10-CM | POA: Diagnosis not present

## 2019-04-01 DIAGNOSIS — R739 Hyperglycemia, unspecified: Secondary | ICD-10-CM | POA: Diagnosis not present

## 2019-04-01 DIAGNOSIS — E559 Vitamin D deficiency, unspecified: Secondary | ICD-10-CM | POA: Diagnosis not present

## 2019-04-01 DIAGNOSIS — Z7902 Long term (current) use of antithrombotics/antiplatelets: Secondary | ICD-10-CM | POA: Diagnosis not present

## 2019-04-01 DIAGNOSIS — R531 Weakness: Secondary | ICD-10-CM | POA: Diagnosis not present

## 2019-04-01 DIAGNOSIS — R2689 Other abnormalities of gait and mobility: Secondary | ICD-10-CM | POA: Diagnosis not present

## 2019-04-01 DIAGNOSIS — R0981 Nasal congestion: Secondary | ICD-10-CM | POA: Diagnosis not present

## 2019-04-01 DIAGNOSIS — J449 Chronic obstructive pulmonary disease, unspecified: Secondary | ICD-10-CM | POA: Diagnosis not present

## 2019-04-01 DIAGNOSIS — Z9181 History of falling: Secondary | ICD-10-CM | POA: Diagnosis not present

## 2019-04-03 DIAGNOSIS — R739 Hyperglycemia, unspecified: Secondary | ICD-10-CM | POA: Diagnosis not present

## 2019-04-03 DIAGNOSIS — Z8673 Personal history of transient ischemic attack (TIA), and cerebral infarction without residual deficits: Secondary | ICD-10-CM | POA: Diagnosis not present

## 2019-04-03 DIAGNOSIS — Z85820 Personal history of malignant melanoma of skin: Secondary | ICD-10-CM | POA: Diagnosis not present

## 2019-04-03 DIAGNOSIS — Z9181 History of falling: Secondary | ICD-10-CM | POA: Diagnosis not present

## 2019-04-03 DIAGNOSIS — Z87891 Personal history of nicotine dependence: Secondary | ICD-10-CM | POA: Diagnosis not present

## 2019-04-03 DIAGNOSIS — R0981 Nasal congestion: Secondary | ICD-10-CM | POA: Diagnosis not present

## 2019-04-03 DIAGNOSIS — E782 Mixed hyperlipidemia: Secondary | ICD-10-CM | POA: Diagnosis not present

## 2019-04-03 DIAGNOSIS — Z7902 Long term (current) use of antithrombotics/antiplatelets: Secondary | ICD-10-CM | POA: Diagnosis not present

## 2019-04-03 DIAGNOSIS — J449 Chronic obstructive pulmonary disease, unspecified: Secondary | ICD-10-CM | POA: Diagnosis not present

## 2019-04-03 DIAGNOSIS — E559 Vitamin D deficiency, unspecified: Secondary | ICD-10-CM | POA: Diagnosis not present

## 2019-04-03 DIAGNOSIS — I5030 Unspecified diastolic (congestive) heart failure: Secondary | ICD-10-CM | POA: Diagnosis not present

## 2019-04-03 DIAGNOSIS — R531 Weakness: Secondary | ICD-10-CM | POA: Diagnosis not present

## 2019-04-03 DIAGNOSIS — R2689 Other abnormalities of gait and mobility: Secondary | ICD-10-CM | POA: Diagnosis not present

## 2019-04-03 DIAGNOSIS — I110 Hypertensive heart disease with heart failure: Secondary | ICD-10-CM | POA: Diagnosis not present

## 2019-04-03 DIAGNOSIS — I7789 Other specified disorders of arteries and arterioles: Secondary | ICD-10-CM | POA: Diagnosis not present

## 2019-04-03 DIAGNOSIS — Z79899 Other long term (current) drug therapy: Secondary | ICD-10-CM | POA: Diagnosis not present

## 2019-04-07 DIAGNOSIS — R410 Disorientation, unspecified: Secondary | ICD-10-CM | POA: Diagnosis not present

## 2019-04-07 DIAGNOSIS — R531 Weakness: Secondary | ICD-10-CM | POA: Diagnosis not present

## 2019-04-07 DIAGNOSIS — F329 Major depressive disorder, single episode, unspecified: Secondary | ICD-10-CM | POA: Diagnosis not present

## 2019-04-07 DIAGNOSIS — J449 Chronic obstructive pulmonary disease, unspecified: Secondary | ICD-10-CM | POA: Diagnosis not present

## 2019-04-07 DIAGNOSIS — N289 Disorder of kidney and ureter, unspecified: Secondary | ICD-10-CM | POA: Diagnosis not present

## 2019-04-07 DIAGNOSIS — Z87898 Personal history of other specified conditions: Secondary | ICD-10-CM | POA: Diagnosis not present

## 2019-04-07 DIAGNOSIS — N179 Acute kidney failure, unspecified: Secondary | ICD-10-CM | POA: Diagnosis not present

## 2019-04-07 DIAGNOSIS — E860 Dehydration: Secondary | ICD-10-CM | POA: Diagnosis not present

## 2019-04-07 DIAGNOSIS — R2681 Unsteadiness on feet: Secondary | ICD-10-CM | POA: Diagnosis not present

## 2019-04-07 DIAGNOSIS — I10 Essential (primary) hypertension: Secondary | ICD-10-CM | POA: Diagnosis not present

## 2019-04-07 DIAGNOSIS — M1990 Unspecified osteoarthritis, unspecified site: Secondary | ICD-10-CM | POA: Diagnosis not present

## 2019-04-07 DIAGNOSIS — G309 Alzheimer's disease, unspecified: Secondary | ICD-10-CM | POA: Diagnosis not present

## 2019-04-07 DIAGNOSIS — S299XXA Unspecified injury of thorax, initial encounter: Secondary | ICD-10-CM | POA: Diagnosis not present

## 2019-04-07 DIAGNOSIS — Z9181 History of falling: Secondary | ICD-10-CM | POA: Diagnosis not present

## 2019-04-07 DIAGNOSIS — Z7902 Long term (current) use of antithrombotics/antiplatelets: Secondary | ICD-10-CM | POA: Diagnosis not present

## 2019-04-07 DIAGNOSIS — I6523 Occlusion and stenosis of bilateral carotid arteries: Secondary | ICD-10-CM | POA: Diagnosis not present

## 2019-04-07 DIAGNOSIS — S0990XA Unspecified injury of head, initial encounter: Secondary | ICD-10-CM | POA: Diagnosis not present

## 2019-04-07 DIAGNOSIS — Z8673 Personal history of transient ischemic attack (TIA), and cerebral infarction without residual deficits: Secondary | ICD-10-CM | POA: Diagnosis not present

## 2019-04-07 DIAGNOSIS — R55 Syncope and collapse: Secondary | ICD-10-CM | POA: Diagnosis not present

## 2019-04-07 DIAGNOSIS — I951 Orthostatic hypotension: Secondary | ICD-10-CM | POA: Diagnosis not present

## 2019-04-07 DIAGNOSIS — Z862 Personal history of diseases of the blood and blood-forming organs and certain disorders involving the immune mechanism: Secondary | ICD-10-CM | POA: Diagnosis not present

## 2019-04-07 DIAGNOSIS — Z79899 Other long term (current) drug therapy: Secondary | ICD-10-CM | POA: Diagnosis not present

## 2019-04-08 DIAGNOSIS — R410 Disorientation, unspecified: Secondary | ICD-10-CM | POA: Diagnosis not present

## 2019-04-08 DIAGNOSIS — R55 Syncope and collapse: Secondary | ICD-10-CM | POA: Diagnosis not present

## 2019-04-08 DIAGNOSIS — N289 Disorder of kidney and ureter, unspecified: Secondary | ICD-10-CM | POA: Diagnosis not present

## 2019-04-08 DIAGNOSIS — F329 Major depressive disorder, single episode, unspecified: Secondary | ICD-10-CM | POA: Diagnosis not present

## 2019-04-09 DIAGNOSIS — R410 Disorientation, unspecified: Secondary | ICD-10-CM | POA: Diagnosis not present

## 2019-04-09 DIAGNOSIS — N179 Acute kidney failure, unspecified: Secondary | ICD-10-CM | POA: Diagnosis not present

## 2019-04-09 DIAGNOSIS — J449 Chronic obstructive pulmonary disease, unspecified: Secondary | ICD-10-CM | POA: Diagnosis not present

## 2019-04-09 DIAGNOSIS — Z9181 History of falling: Secondary | ICD-10-CM | POA: Diagnosis not present

## 2019-04-09 DIAGNOSIS — N289 Disorder of kidney and ureter, unspecified: Secondary | ICD-10-CM | POA: Diagnosis not present

## 2019-04-09 DIAGNOSIS — G309 Alzheimer's disease, unspecified: Secondary | ICD-10-CM | POA: Diagnosis not present

## 2019-04-09 DIAGNOSIS — I951 Orthostatic hypotension: Secondary | ICD-10-CM | POA: Diagnosis not present

## 2019-04-09 DIAGNOSIS — Z8673 Personal history of transient ischemic attack (TIA), and cerebral infarction without residual deficits: Secondary | ICD-10-CM | POA: Diagnosis not present

## 2019-04-09 DIAGNOSIS — Z87898 Personal history of other specified conditions: Secondary | ICD-10-CM | POA: Diagnosis not present

## 2019-04-09 DIAGNOSIS — M1990 Unspecified osteoarthritis, unspecified site: Secondary | ICD-10-CM | POA: Diagnosis not present

## 2019-04-09 DIAGNOSIS — F329 Major depressive disorder, single episode, unspecified: Secondary | ICD-10-CM | POA: Diagnosis not present

## 2019-04-09 DIAGNOSIS — R2681 Unsteadiness on feet: Secondary | ICD-10-CM | POA: Diagnosis not present

## 2019-04-09 DIAGNOSIS — R55 Syncope and collapse: Secondary | ICD-10-CM | POA: Diagnosis not present

## 2019-04-09 DIAGNOSIS — Z862 Personal history of diseases of the blood and blood-forming organs and certain disorders involving the immune mechanism: Secondary | ICD-10-CM | POA: Diagnosis not present

## 2019-04-09 DIAGNOSIS — I10 Essential (primary) hypertension: Secondary | ICD-10-CM | POA: Diagnosis not present

## 2019-04-09 DIAGNOSIS — Z7902 Long term (current) use of antithrombotics/antiplatelets: Secondary | ICD-10-CM | POA: Diagnosis not present

## 2019-04-09 DIAGNOSIS — Z79899 Other long term (current) drug therapy: Secondary | ICD-10-CM | POA: Diagnosis not present

## 2019-04-09 DIAGNOSIS — E860 Dehydration: Secondary | ICD-10-CM | POA: Diagnosis not present

## 2019-04-10 DIAGNOSIS — R531 Weakness: Secondary | ICD-10-CM | POA: Diagnosis not present

## 2019-04-10 DIAGNOSIS — E559 Vitamin D deficiency, unspecified: Secondary | ICD-10-CM | POA: Diagnosis not present

## 2019-04-10 DIAGNOSIS — R0981 Nasal congestion: Secondary | ICD-10-CM | POA: Diagnosis not present

## 2019-04-10 DIAGNOSIS — I5030 Unspecified diastolic (congestive) heart failure: Secondary | ICD-10-CM | POA: Diagnosis not present

## 2019-04-10 DIAGNOSIS — Z7902 Long term (current) use of antithrombotics/antiplatelets: Secondary | ICD-10-CM | POA: Diagnosis not present

## 2019-04-10 DIAGNOSIS — Z9181 History of falling: Secondary | ICD-10-CM | POA: Diagnosis not present

## 2019-04-10 DIAGNOSIS — Z8673 Personal history of transient ischemic attack (TIA), and cerebral infarction without residual deficits: Secondary | ICD-10-CM | POA: Diagnosis not present

## 2019-04-10 DIAGNOSIS — Z87891 Personal history of nicotine dependence: Secondary | ICD-10-CM | POA: Diagnosis not present

## 2019-04-10 DIAGNOSIS — E782 Mixed hyperlipidemia: Secondary | ICD-10-CM | POA: Diagnosis not present

## 2019-04-10 DIAGNOSIS — R739 Hyperglycemia, unspecified: Secondary | ICD-10-CM | POA: Diagnosis not present

## 2019-04-10 DIAGNOSIS — Z79899 Other long term (current) drug therapy: Secondary | ICD-10-CM | POA: Diagnosis not present

## 2019-04-10 DIAGNOSIS — I110 Hypertensive heart disease with heart failure: Secondary | ICD-10-CM | POA: Diagnosis not present

## 2019-04-10 DIAGNOSIS — Z85820 Personal history of malignant melanoma of skin: Secondary | ICD-10-CM | POA: Diagnosis not present

## 2019-04-10 DIAGNOSIS — J449 Chronic obstructive pulmonary disease, unspecified: Secondary | ICD-10-CM | POA: Diagnosis not present

## 2019-04-10 DIAGNOSIS — I7789 Other specified disorders of arteries and arterioles: Secondary | ICD-10-CM | POA: Diagnosis not present

## 2019-04-10 DIAGNOSIS — R2689 Other abnormalities of gait and mobility: Secondary | ICD-10-CM | POA: Diagnosis not present

## 2019-04-14 DIAGNOSIS — I5030 Unspecified diastolic (congestive) heart failure: Secondary | ICD-10-CM | POA: Diagnosis not present

## 2019-04-14 DIAGNOSIS — J449 Chronic obstructive pulmonary disease, unspecified: Secondary | ICD-10-CM | POA: Diagnosis not present

## 2019-04-14 DIAGNOSIS — R739 Hyperglycemia, unspecified: Secondary | ICD-10-CM | POA: Diagnosis not present

## 2019-04-14 DIAGNOSIS — I7789 Other specified disorders of arteries and arterioles: Secondary | ICD-10-CM | POA: Diagnosis not present

## 2019-04-14 DIAGNOSIS — E782 Mixed hyperlipidemia: Secondary | ICD-10-CM | POA: Diagnosis not present

## 2019-04-14 DIAGNOSIS — E559 Vitamin D deficiency, unspecified: Secondary | ICD-10-CM | POA: Diagnosis not present

## 2019-04-14 DIAGNOSIS — I110 Hypertensive heart disease with heart failure: Secondary | ICD-10-CM | POA: Diagnosis not present

## 2019-04-14 DIAGNOSIS — Z79899 Other long term (current) drug therapy: Secondary | ICD-10-CM | POA: Diagnosis not present

## 2019-04-14 DIAGNOSIS — Z9181 History of falling: Secondary | ICD-10-CM | POA: Diagnosis not present

## 2019-04-14 DIAGNOSIS — R531 Weakness: Secondary | ICD-10-CM | POA: Diagnosis not present

## 2019-04-14 DIAGNOSIS — Z7902 Long term (current) use of antithrombotics/antiplatelets: Secondary | ICD-10-CM | POA: Diagnosis not present

## 2019-04-14 DIAGNOSIS — Z8673 Personal history of transient ischemic attack (TIA), and cerebral infarction without residual deficits: Secondary | ICD-10-CM | POA: Diagnosis not present

## 2019-04-14 DIAGNOSIS — R0981 Nasal congestion: Secondary | ICD-10-CM | POA: Diagnosis not present

## 2019-04-14 DIAGNOSIS — Z85820 Personal history of malignant melanoma of skin: Secondary | ICD-10-CM | POA: Diagnosis not present

## 2019-04-14 DIAGNOSIS — Z87891 Personal history of nicotine dependence: Secondary | ICD-10-CM | POA: Diagnosis not present

## 2019-04-14 DIAGNOSIS — R2689 Other abnormalities of gait and mobility: Secondary | ICD-10-CM | POA: Diagnosis not present

## 2019-04-15 DIAGNOSIS — I712 Thoracic aortic aneurysm, without rupture: Secondary | ICD-10-CM | POA: Diagnosis not present

## 2019-04-15 DIAGNOSIS — I7789 Other specified disorders of arteries and arterioles: Secondary | ICD-10-CM | POA: Diagnosis not present

## 2019-04-15 DIAGNOSIS — N179 Acute kidney failure, unspecified: Secondary | ICD-10-CM | POA: Diagnosis not present

## 2019-04-15 DIAGNOSIS — E860 Dehydration: Secondary | ICD-10-CM | POA: Diagnosis not present

## 2019-04-15 DIAGNOSIS — Z79899 Other long term (current) drug therapy: Secondary | ICD-10-CM | POA: Diagnosis not present

## 2019-04-15 DIAGNOSIS — Z7689 Persons encountering health services in other specified circumstances: Secondary | ICD-10-CM | POA: Diagnosis not present

## 2019-04-15 DIAGNOSIS — E538 Deficiency of other specified B group vitamins: Secondary | ICD-10-CM | POA: Diagnosis not present

## 2019-04-18 DIAGNOSIS — R0981 Nasal congestion: Secondary | ICD-10-CM | POA: Diagnosis not present

## 2019-04-18 DIAGNOSIS — E782 Mixed hyperlipidemia: Secondary | ICD-10-CM | POA: Diagnosis not present

## 2019-04-18 DIAGNOSIS — Z8673 Personal history of transient ischemic attack (TIA), and cerebral infarction without residual deficits: Secondary | ICD-10-CM | POA: Diagnosis not present

## 2019-04-18 DIAGNOSIS — R531 Weakness: Secondary | ICD-10-CM | POA: Diagnosis not present

## 2019-04-18 DIAGNOSIS — E559 Vitamin D deficiency, unspecified: Secondary | ICD-10-CM | POA: Diagnosis not present

## 2019-04-18 DIAGNOSIS — R2689 Other abnormalities of gait and mobility: Secondary | ICD-10-CM | POA: Diagnosis not present

## 2019-04-18 DIAGNOSIS — Z7902 Long term (current) use of antithrombotics/antiplatelets: Secondary | ICD-10-CM | POA: Diagnosis not present

## 2019-04-18 DIAGNOSIS — Z79899 Other long term (current) drug therapy: Secondary | ICD-10-CM | POA: Diagnosis not present

## 2019-04-18 DIAGNOSIS — J449 Chronic obstructive pulmonary disease, unspecified: Secondary | ICD-10-CM | POA: Diagnosis not present

## 2019-04-18 DIAGNOSIS — Z87891 Personal history of nicotine dependence: Secondary | ICD-10-CM | POA: Diagnosis not present

## 2019-04-18 DIAGNOSIS — R739 Hyperglycemia, unspecified: Secondary | ICD-10-CM | POA: Diagnosis not present

## 2019-04-18 DIAGNOSIS — I7789 Other specified disorders of arteries and arterioles: Secondary | ICD-10-CM | POA: Diagnosis not present

## 2019-04-18 DIAGNOSIS — I5030 Unspecified diastolic (congestive) heart failure: Secondary | ICD-10-CM | POA: Diagnosis not present

## 2019-04-18 DIAGNOSIS — I110 Hypertensive heart disease with heart failure: Secondary | ICD-10-CM | POA: Diagnosis not present

## 2019-04-18 DIAGNOSIS — Z9181 History of falling: Secondary | ICD-10-CM | POA: Diagnosis not present

## 2019-04-18 DIAGNOSIS — Z85820 Personal history of malignant melanoma of skin: Secondary | ICD-10-CM | POA: Diagnosis not present

## 2019-04-28 ENCOUNTER — Other Ambulatory Visit: Payer: Self-pay

## 2019-04-28 DIAGNOSIS — C44311 Basal cell carcinoma of skin of nose: Secondary | ICD-10-CM | POA: Diagnosis not present

## 2019-04-28 DIAGNOSIS — D0339 Melanoma in situ of other parts of face: Secondary | ICD-10-CM | POA: Diagnosis not present

## 2019-04-28 DIAGNOSIS — D485 Neoplasm of uncertain behavior of skin: Secondary | ICD-10-CM | POA: Diagnosis not present

## 2019-04-28 DIAGNOSIS — Z85820 Personal history of malignant melanoma of skin: Secondary | ICD-10-CM | POA: Diagnosis not present

## 2019-04-28 DIAGNOSIS — L905 Scar conditions and fibrosis of skin: Secondary | ICD-10-CM | POA: Diagnosis not present

## 2019-04-28 DIAGNOSIS — L570 Actinic keratosis: Secondary | ICD-10-CM | POA: Diagnosis not present

## 2019-04-29 DIAGNOSIS — R238 Other skin changes: Secondary | ICD-10-CM | POA: Diagnosis not present

## 2019-05-07 DIAGNOSIS — R0981 Nasal congestion: Secondary | ICD-10-CM | POA: Diagnosis not present

## 2019-05-07 DIAGNOSIS — Z8673 Personal history of transient ischemic attack (TIA), and cerebral infarction without residual deficits: Secondary | ICD-10-CM | POA: Diagnosis not present

## 2019-05-07 DIAGNOSIS — R531 Weakness: Secondary | ICD-10-CM | POA: Diagnosis not present

## 2019-05-07 DIAGNOSIS — J449 Chronic obstructive pulmonary disease, unspecified: Secondary | ICD-10-CM | POA: Diagnosis not present

## 2019-05-07 DIAGNOSIS — Z7902 Long term (current) use of antithrombotics/antiplatelets: Secondary | ICD-10-CM | POA: Diagnosis not present

## 2019-05-07 DIAGNOSIS — Z87891 Personal history of nicotine dependence: Secondary | ICD-10-CM | POA: Diagnosis not present

## 2019-05-07 DIAGNOSIS — Z9181 History of falling: Secondary | ICD-10-CM | POA: Diagnosis not present

## 2019-05-07 DIAGNOSIS — I7789 Other specified disorders of arteries and arterioles: Secondary | ICD-10-CM | POA: Diagnosis not present

## 2019-05-07 DIAGNOSIS — I5030 Unspecified diastolic (congestive) heart failure: Secondary | ICD-10-CM | POA: Diagnosis not present

## 2019-05-07 DIAGNOSIS — E559 Vitamin D deficiency, unspecified: Secondary | ICD-10-CM | POA: Diagnosis not present

## 2019-05-07 DIAGNOSIS — Z85820 Personal history of malignant melanoma of skin: Secondary | ICD-10-CM | POA: Diagnosis not present

## 2019-05-07 DIAGNOSIS — I110 Hypertensive heart disease with heart failure: Secondary | ICD-10-CM | POA: Diagnosis not present

## 2019-05-07 DIAGNOSIS — E782 Mixed hyperlipidemia: Secondary | ICD-10-CM | POA: Diagnosis not present

## 2019-05-07 DIAGNOSIS — R2689 Other abnormalities of gait and mobility: Secondary | ICD-10-CM | POA: Diagnosis not present

## 2019-05-07 DIAGNOSIS — Z79899 Other long term (current) drug therapy: Secondary | ICD-10-CM | POA: Diagnosis not present

## 2019-05-07 DIAGNOSIS — R739 Hyperglycemia, unspecified: Secondary | ICD-10-CM | POA: Diagnosis not present

## 2019-05-21 ENCOUNTER — Encounter (INDEPENDENT_AMBULATORY_CARE_PROVIDER_SITE_OTHER): Payer: Self-pay | Admitting: Otolaryngology

## 2019-05-21 ENCOUNTER — Ambulatory Visit (INDEPENDENT_AMBULATORY_CARE_PROVIDER_SITE_OTHER): Payer: Medicare Other | Admitting: Otolaryngology

## 2019-05-21 ENCOUNTER — Other Ambulatory Visit: Payer: Self-pay

## 2019-05-21 VITALS — Temp 97.9°F

## 2019-05-21 DIAGNOSIS — H6123 Impacted cerumen, bilateral: Secondary | ICD-10-CM | POA: Diagnosis not present

## 2019-05-21 DIAGNOSIS — C4330 Malignant melanoma of unspecified part of face: Secondary | ICD-10-CM

## 2019-05-21 NOTE — Progress Notes (Signed)
HPI: Kevin Lynch. is a 86 y.o. male who presents for evaluation of left facial melanoma.  Patient was recently seen at [facility] dermatology and had several skin lesions removed.  He had a basal cell carcinoma removed from the nose but had a shave biopsy of the left temporal area that showed malignant melanoma with Breslow thickness of at least 5 mm.  He is referred here for further treatment. Patient had a previous history of a melanoma removed from his chest in 2006. Patient has history of hypertension, COPD and a stroke in 2012 for which he continues to take Plavix.  He is also developed dementia since his stroke.  He worked up until he was 86 years old.  He presents with his wife today.  Past Medical History:  Diagnosis Date  . Actinic keratosis   . Anemia   . Basal cell carcinoma   . COPD (chronic obstructive pulmonary disease) ([place])   . Diastolic heart failure ([place])   . Gastric ulcer    from 06/06/15: ULCER/GASTRITIS/ANEMIA:  . HDL lipoprotein deficiency   . History of stroke   . Hyperglycemia   . Hyperlipidemia   . Hypertension   . Prediabetes   . Restless leg syndrome    Past Surgical History:  Procedure Laterality Date  . CATARACT EXTRACTION    . HEMORROIDECTOMY    . MELANOMA EXCISION    . NASAL SEPTUM SURGERY    . PROSTATE SURGERY    . VASECTOMY     Social History   Socioeconomic History  . Marital status: Married    Spouse name: Not on file  . Number of children: Not on file  . Years of education: Not on file  . Highest education level: Not on file  Occupational History  . Not on file  Tobacco Use  . Smoking status: Former Smoker    Packs/day: 0.50    Years: 30.00    Pack years: 15.00    Types: Cigarettes    Quit date: 05/01/1981    Years since quitting: 38.0  . Smokeless tobacco: Former Systems developer    Types: Chew  Substance and Sexual Activity  . Alcohol use: Not Currently  . Drug use: Never  . Sexual activity: Not on file  Other Topics Concern  . Not on  file  Social History Narrative  . Not on file   Social Determinants of Health   Financial Resource Strain:   . Difficulty of Paying Living Expenses: Not on file  Food Insecurity:   . Worried About Charity fundraiser in the Last Year: Not on file  . Ran Out of Food in the Last Year: Not on file  Transportation Needs:   . Lack of Transportation (Medical): Not on file  . Lack of Transportation (Non-Medical): Not on file  Physical Activity:   . Days of Exercise per Week: Not on file  . Minutes of Exercise per Session: Not on file  Stress:   . Feeling of Stress : Not on file  Social Connections:   . Frequency of Communication with Friends and Family: Not on file  . Frequency of Social Gatherings with Friends and Family: Not on file  . Attends Religious Services: Not on file  . Active Member of Clubs or Organizations: Not on file  . Attends Archivist Meetings: Not on file  . Marital Status: Not on file   Family History  Problem Relation Age of Onset  . Stroke Mother   .  Hypertension Mother   . CVA Mother   . Heart attack Mother   . Hypertension Father   . Stroke Father   . Heart attack Father   . Asthma Brother   . Hypertension Brother   . Heart attack Brother   . Hyperlipidemia Brother    Allergies  Allergen Reactions  . Shellfish Allergy Hives and Shortness Of Breath  . Codeine Nausea Only  . Lisinopril     Cough memory difficulty    Prior to Admission medications   Medication Sig Start Date End Date Taking? Authorizing Provider  albuterol (PROVENTIL) (2.5 MG/3ML) 0.083% nebulizer solution Take 2.5 mg by nebulization every 6 (six) hours as needed for wheezing or shortness of breath.    [provider]  cholecalciferol (VITAMIN D) 1000 units tablet Take 1,000 Units by mouth daily.    [provider]  clopidogrel (PLAVIX) 75 MG tablet Take 75 mg by mouth daily.    [provider]  Coenzyme Q10 (COQ-10) 100 MG CAPS Take 1 capsule by  mouth daily.    [provider]  donepezil (ARICEPT) 10 MG tablet Take 10 mg by mouth at bedtime.    [provider]  escitalopram (LEXAPRO) 20 MG tablet Take 20 mg by mouth daily.     [provider]  Fluticasone-Umeclidin-Vilant (TRELEGY ELLIPTA) 100-62.5-25 MCG/INH AEPB Inhale 1 puff into the lungs daily.    [provider]  Loperamide HCl (IMODIUM PO) Take 1 tablet by mouth daily as needed.    [provider]  memantine (NAMENDA) 10 MG tablet Take 10 mg by mouth daily. 10/27/17   [provider]  metoprolol succinate (TOPROL-XL) 25 MG 24 hr tablet Take 25 mg by mouth daily.    [provider]  spironolactone (ALDACTONE) 25 MG tablet Take 25 mg by mouth daily.    [provider]  terazosin (HYTRIN) 5 MG capsule Take 5 mg by mouth at bedtime.    [provider]  valsartan (DIOVAN) 80 MG tablet Take 80 mg by mouth daily.     [provider]     Positive ROS: Otherwise negative  All other systems have been reviewed and were otherwise negative with the exception of those mentioned in the HPI and as above.  Physical Exam: Constitutional: Alert, well-appearing, no acute distress Ears: External ears without lesions or tenderness.  He had a mild amount of wax buildup in both ear canals that was cleaned in the office.  Both TMs were clear. Nasal: External nose with basal cell carcinoma on the right side that had been previously biopsied and is scheduled to have this removed in a couple of months.. Septum with minimal deformity.. Clear nasal passages Oral: Lips and gums without lesions. Tongue and palate mucosa without lesions. Posterior oropharynx clear. Neck: No palpable adenopathy or masses Respiratory: Breathing comfortably  Skin: Patient has a pigmented lesion measuring approximately 2 cm in size in front of the helix of his left ear in the temple region.  This has healed since the biopsy was performed on  04/28/2019.  There is no gross palpable adenopathy or masses within the parotid gland or in the upper neck on the left side.  Cerumen impaction removal  Date/Time: 05/21/2019 1:36 PM Performed by: Rozetta Nunnery, MD Authorized by: Rozetta Nunnery, MD   Consent:    Consent obtained:  Verbal   Consent given by:  Patient   Risks discussed:  Pain and bleeding Procedure details:  Location:  L ear and R ear Post-procedure details:    Inspection:  TM intact and canal normal   Hearing quality:  Improved   Patient tolerance of procedure:  Tolerated well, no immediate complications    Assessment: Left facial melanoma T4b with Breslow thickness 5 mm  Plan: I will plan on discussing the patient with Dr. Constance Holster who is on the melanoma team for further surgical treatment if he elects to see the patient.  Radene Journey, MD

## 2019-05-22 DIAGNOSIS — Z87891 Personal history of nicotine dependence: Secondary | ICD-10-CM | POA: Diagnosis not present

## 2019-05-22 DIAGNOSIS — E782 Mixed hyperlipidemia: Secondary | ICD-10-CM | POA: Diagnosis not present

## 2019-05-22 DIAGNOSIS — Z9181 History of falling: Secondary | ICD-10-CM | POA: Diagnosis not present

## 2019-05-22 DIAGNOSIS — Z7902 Long term (current) use of antithrombotics/antiplatelets: Secondary | ICD-10-CM | POA: Diagnosis not present

## 2019-05-22 DIAGNOSIS — I7789 Other specified disorders of arteries and arterioles: Secondary | ICD-10-CM | POA: Diagnosis not present

## 2019-05-22 DIAGNOSIS — I110 Hypertensive heart disease with heart failure: Secondary | ICD-10-CM | POA: Diagnosis not present

## 2019-05-22 DIAGNOSIS — R2689 Other abnormalities of gait and mobility: Secondary | ICD-10-CM | POA: Diagnosis not present

## 2019-05-22 DIAGNOSIS — Z85820 Personal history of malignant melanoma of skin: Secondary | ICD-10-CM | POA: Diagnosis not present

## 2019-05-22 DIAGNOSIS — J449 Chronic obstructive pulmonary disease, unspecified: Secondary | ICD-10-CM | POA: Diagnosis not present

## 2019-05-22 DIAGNOSIS — E559 Vitamin D deficiency, unspecified: Secondary | ICD-10-CM | POA: Diagnosis not present

## 2019-05-22 DIAGNOSIS — Z8673 Personal history of transient ischemic attack (TIA), and cerebral infarction without residual deficits: Secondary | ICD-10-CM | POA: Diagnosis not present

## 2019-05-22 DIAGNOSIS — R0981 Nasal congestion: Secondary | ICD-10-CM | POA: Diagnosis not present

## 2019-05-22 DIAGNOSIS — R739 Hyperglycemia, unspecified: Secondary | ICD-10-CM | POA: Diagnosis not present

## 2019-05-22 DIAGNOSIS — I5030 Unspecified diastolic (congestive) heart failure: Secondary | ICD-10-CM | POA: Diagnosis not present

## 2019-05-22 DIAGNOSIS — R531 Weakness: Secondary | ICD-10-CM | POA: Diagnosis not present

## 2019-05-22 DIAGNOSIS — Z79899 Other long term (current) drug therapy: Secondary | ICD-10-CM | POA: Diagnosis not present

## 2019-05-27 DIAGNOSIS — J449 Chronic obstructive pulmonary disease, unspecified: Secondary | ICD-10-CM | POA: Diagnosis not present

## 2019-05-27 DIAGNOSIS — C4330 Malignant melanoma of unspecified part of face: Secondary | ICD-10-CM | POA: Diagnosis not present

## 2019-06-03 ENCOUNTER — Other Ambulatory Visit: Payer: Self-pay | Admitting: Otolaryngology

## 2019-06-03 ENCOUNTER — Other Ambulatory Visit (HOSPITAL_COMMUNITY): Payer: Self-pay | Admitting: Otolaryngology

## 2019-06-03 DIAGNOSIS — F0150 Vascular dementia without behavioral disturbance: Secondary | ICD-10-CM

## 2019-06-03 DIAGNOSIS — C4330 Malignant melanoma of unspecified part of face: Secondary | ICD-10-CM

## 2019-06-03 DIAGNOSIS — J449 Chronic obstructive pulmonary disease, unspecified: Secondary | ICD-10-CM

## 2019-06-11 ENCOUNTER — Ambulatory Visit
Admission: RE | Admit: 2019-06-11 | Discharge: 2019-06-11 | Disposition: A | Discharge status: Home / Self Care | Payer: Medicare Other | Source: Ambulatory Visit | Attending: Otolaryngology | Admitting: Otolaryngology

## 2019-06-11 ENCOUNTER — Other Ambulatory Visit: Payer: Self-pay

## 2019-06-11 DIAGNOSIS — F0150 Vascular dementia without behavioral disturbance: Secondary | ICD-10-CM

## 2019-06-11 DIAGNOSIS — R221 Localized swelling, mass and lump, neck: Secondary | ICD-10-CM | POA: Diagnosis not present

## 2019-06-11 DIAGNOSIS — C4330 Malignant melanoma of unspecified part of face: Secondary | ICD-10-CM

## 2019-06-11 DIAGNOSIS — J449 Chronic obstructive pulmonary disease, unspecified: Secondary | ICD-10-CM

## 2019-06-11 MED ORDER — IOPAMIDOL (ISOVUE-300) INJECTION 61%
60.0000 mL | Freq: Once | INTRAVENOUS | Status: AC | PRN
  Administered 2019-06-11: 60 mL via INTRAVENOUS

## 2019-06-13 ENCOUNTER — Other Ambulatory Visit: Payer: Self-pay

## 2019-06-13 ENCOUNTER — Encounter (HOSPITAL_COMMUNITY)
Admission: RE | Admit: 2019-06-13 | Discharge: 2019-06-13 | Disposition: A | Discharge status: Home / Self Care | Payer: Medicare Other | Source: Ambulatory Visit | Attending: Otolaryngology | Admitting: Otolaryngology

## 2019-06-13 DIAGNOSIS — C4330 Malignant melanoma of unspecified part of face: Secondary | ICD-10-CM | POA: Diagnosis not present

## 2019-06-13 DIAGNOSIS — C439 Malignant melanoma of skin, unspecified: Secondary | ICD-10-CM | POA: Diagnosis not present

## 2019-06-13 DIAGNOSIS — J449 Chronic obstructive pulmonary disease, unspecified: Secondary | ICD-10-CM | POA: Insufficient documentation

## 2019-06-13 DIAGNOSIS — N281 Cyst of kidney, acquired: Secondary | ICD-10-CM | POA: Diagnosis not present

## 2019-06-13 LAB — GLUCOSE, CAPILLARY: Glucose-Capillary: 113 mg/dL — ABNORMAL HIGH (ref 70–99)

## 2019-06-13 MED ORDER — FLUDEOXYGLUCOSE F - 18 (FDG) INJECTION
8.3800 | Freq: Once | INTRAVENOUS | Status: AC
  Administered 2019-06-13: 8.38 via INTRAVENOUS

## 2019-06-16 ENCOUNTER — Other Ambulatory Visit (HOSPITAL_COMMUNITY): Payer: Self-pay | Admitting: Otolaryngology

## 2019-06-16 ENCOUNTER — Other Ambulatory Visit: Payer: Self-pay | Admitting: Otolaryngology

## 2019-06-16 DIAGNOSIS — C4330 Malignant melanoma of unspecified part of face: Secondary | ICD-10-CM

## 2019-06-24 ENCOUNTER — Encounter (HOSPITAL_COMMUNITY): Payer: Medicare Other

## 2019-07-01 DIAGNOSIS — C44311 Basal cell carcinoma of skin of nose: Secondary | ICD-10-CM | POA: Diagnosis not present

## 2019-07-09 ENCOUNTER — Ambulatory Visit (HOSPITAL_COMMUNITY)
Admission: RE | Admit: 2019-07-09 | Discharge: 2019-07-09 | Disposition: A | Discharge status: Home / Self Care | Payer: Medicare Other | Source: Ambulatory Visit | Attending: Otolaryngology | Admitting: Otolaryngology

## 2019-07-09 ENCOUNTER — Other Ambulatory Visit: Payer: Self-pay

## 2019-07-09 DIAGNOSIS — C4330 Malignant melanoma of unspecified part of face: Secondary | ICD-10-CM

## 2019-07-09 MED ORDER — TECHNETIUM TC 99M SULFUR COLLOID FILTERED
0.5000 | Freq: Once | INTRAVENOUS | Status: AC | PRN
  Administered 2019-07-09: 10:00:00 0.5 via INTRADERMAL

## 2019-07-18 ENCOUNTER — Telehealth: Payer: Self-pay | Admitting: Oncology

## 2019-07-18 NOTE — Telephone Encounter (Signed)
Received a new pt referral from Dr. Constance Holster for dx of melanoma of temple. I cld and spoke to the pt's wife to schedule an appt w/Dr. Alen Blew on 3/26 at 2pm. Mrs. Hardie is aware to arrive 15 minutes early.

## 2019-07-25 ENCOUNTER — Inpatient Hospital Stay: Payer: Medicare Other | Attending: Oncology | Admitting: Oncology

## 2019-07-25 ENCOUNTER — Other Ambulatory Visit: Payer: Self-pay

## 2019-07-25 VITALS — BP 138/66 | HR 75 | Temp 98.5°F | Resp 18 | Ht 63.0 in | Wt 176.4 lb

## 2019-07-25 DIAGNOSIS — C4339 Malignant melanoma of other parts of face: Secondary | ICD-10-CM | POA: Diagnosis not present

## 2019-07-25 DIAGNOSIS — C439 Malignant melanoma of skin, unspecified: Secondary | ICD-10-CM

## 2019-07-25 NOTE — Progress Notes (Signed)
Reason for the request:    Melanoma  HPI: I was asked by Dr. Constance Holster  to evaluate Kevin Lynch for a diagnosis of melanoma.  He is a 86 year old man with history of COPD, hyperlipidemia and hypertension was found to have a pigmented lesion on his left temple.  He was evaluated by dermatology on December 28 and a shave biopsy showed malignant melanoma with positive margins with depth of invasion at least 5 mm.  No ulceration present but the margins were positive with initial staging of T4b.  PET CT scan obtained by Dr. Constance Holster on June 13, 2019 which showed no evidence of metastatic disease and no residual avid uptake in the face or lymphadenopathy.  Nuclear lymphoscintigraphy showed that sentinel lymph node was in the left inferolateral neck.  He is under evaluation for possible surgical resection although was asked by Dr. Constance Holster to evaluate alternative options given the extent of surgery.  Clinically, he reports no complaints at this time.  He does report regrowth of his tumor on the left cheek and temple.  He is limited in his performance status but does ambulate short distances.  He is able to walk the dog and attempts to do some activities of daily living.  He does have mild dementia planes.    He does not report any headaches, blurry vision, syncope or seizures. Does not report any fevers, chills or sweats.  Does not report any cough, wheezing or hemoptysis.  Does not report any chest pain, palpitation, orthopnea or leg edema.  Does not report any nausea, vomiting or abdominal pain.  Does not report any constipation or diarrhea.  Does not report any skeletal complaints.    Does not report frequency, urgency or hematuria.  Does not report any skin rashes or lesions. Does not report any heat or cold intolerance.  Does not report any lymphadenopathy or petechiae.  Does not report any anxiety or depression.  Remaining review of systems is negative.    Past Medical History:  Diagnosis Date  . Actinic  keratosis   . Anemia   . Basal cell carcinoma   . COPD (chronic obstructive pulmonary disease) ([place])   . Diastolic heart failure ([place])   . Gastric ulcer    from 06/06/15: ULCER/GASTRITIS/ANEMIA:  . HDL lipoprotein deficiency   . History of stroke   . Hyperglycemia   . Hyperlipidemia   . Hypertension   . Prediabetes   . Restless leg syndrome   :  Past Surgical History:  Procedure Laterality Date  . CATARACT EXTRACTION    . HEMORROIDECTOMY    . MELANOMA EXCISION    . NASAL SEPTUM SURGERY    . PROSTATE SURGERY    . VASECTOMY    :   Current Outpatient Medications:  .  albuterol (PROVENTIL) (2.5 MG/3ML) 0.083% nebulizer solution, Take 2.5 mg by nebulization every 6 (six) hours as needed for wheezing or shortness of breath., Disp: , Rfl:  .  cholecalciferol (VITAMIN D) 1000 units tablet, Take 1,000 Units by mouth daily., Disp: , Rfl:  .  clopidogrel (PLAVIX) 75 MG tablet, Take 75 mg by mouth daily., Disp: , Rfl:  .  Coenzyme Q10 (COQ-10) 100 MG CAPS, Take 1 capsule by mouth daily., Disp: , Rfl:  .  donepezil (ARICEPT) 10 MG tablet, Take 10 mg by mouth at bedtime., Disp: , Rfl:  .  escitalopram (LEXAPRO) 20 MG tablet, Take 20 mg by mouth daily. , Disp: , Rfl:  .  Fluticasone-Umeclidin-Vilant (TRELEGY ELLIPTA) 100-62.5-25 MCG/INH  AEPB, Inhale 1 puff into the lungs daily., Disp: , Rfl:  .  Loperamide HCl (IMODIUM PO), Take 1 tablet by mouth daily as needed., Disp: , Rfl:  .  memantine (NAMENDA) 10 MG tablet, Take 10 mg by mouth daily., Disp: , Rfl:  .  metoprolol succinate (TOPROL-XL) 25 MG 24 hr tablet, Take 25 mg by mouth daily., Disp: , Rfl:  .  spironolactone (ALDACTONE) 25 MG tablet, Take 25 mg by mouth daily., Disp: , Rfl:  .  terazosin (HYTRIN) 5 MG capsule, Take 5 mg by mouth at bedtime., Disp: , Rfl:  .  valsartan (DIOVAN) 80 MG tablet, Take 80 mg by mouth daily. , Disp: , Rfl: :  Allergies  Allergen Reactions  . Shellfish Allergy Hives and Shortness Of Breath  . Codeine  Nausea Only  . Lisinopril     Cough memory difficulty   :  Family History  Problem Relation Age of Onset  . Stroke Mother   . Hypertension Mother   . CVA Mother   . Heart attack Mother   . Hypertension Father   . Stroke Father   . Heart attack Father   . Asthma Brother   . Hypertension Brother   . Heart attack Brother   . Hyperlipidemia Brother   :  Social History   Socioeconomic History  . Marital status: Married    Spouse name: Not on file  . Number of children: Not on file  . Years of education: Not on file  . Highest education level: Not on file  Occupational History  . Not on file  Tobacco Use  . Smoking status: Former Smoker    Packs/day: 0.50    Years: 30.00    Pack years: 15.00    Types: Cigarettes    Quit date: 05/01/1981    Years since quitting: 38.2  . Smokeless tobacco: Former Systems developer    Types: Chew  Substance and Sexual Activity  . Alcohol use: Not Currently  . Drug use: Never  . Sexual activity: Not on file  Other Topics Concern  . Not on file  Social History Narrative  . Not on file   Social Determinants of Health   Financial Resource Strain:   . Difficulty of Paying Living Expenses:   Food Insecurity:   . Worried About Charity fundraiser in the Last Year:   . Arboriculturist in the Last Year:   Transportation Needs:   . Film/video editor (Medical):   Marland Kitchen Lack of Transportation (Non-Medical):   Physical Activity:   . Days of Exercise per Week:   . Minutes of Exercise per Session:   Stress:   . Feeling of Stress :   Social Connections:   . Frequency of Communication with Friends and Family:   . Frequency of Social Gatherings with Friends and Family:   . Attends Religious Services:   . Active Member of Clubs or Organizations:   . Attends Archivist Meetings:   Marland Kitchen Marital Status:   Intimate Partner Violence:   . Fear of Current or Ex-Partner:   . Emotionally Abused:   Marland Kitchen Physically Abused:   . Sexually Abused:    :  Pertinent items are noted in HPI.  Exam: Blood pressure 138/66, pulse 75, temperature 98.5 F (36.9 C), temperature source Temporal, resp. rate 18, height 5\' 3"  (1.6 m), weight 176 lb 6.4 oz (80 kg), SpO2 97 %.  ECOG 2  General appearance: alert and cooperative appeared without  distress. Head: atraumatic without any abnormalities. Eyes: conjunctivae/corneas clear. PERRL.  Sclera anicteric. Throat: lips, mucosa, and tongue normal; without oral thrush or ulcers. Resp: clear to auscultation bilaterally without rhonchi, wheezes or dullness to percussion. Cardio: regular rate and rhythm, S1, S2 normal, no murmur, click, rub or gallop GI: soft, non-tender; bowel sounds normal; no masses,  no organomegaly Skin: Dark Pigmented nodular lesion noted on the left cheek/temple. Lymph nodes: Cervical, supraclavicular, and axillary nodes normal. Neurologic: Grossly normal without any motor, sensory or deep tendon reflexes. Musculoskeletal: No joint deformity or effusion.    NM Lymph/Gland  Result Date: 07/09/2019 CLINICAL DATA:  Face melanoma. EXAM: NUCLEAR MEDICINE LYMPHANGIOGRAPHY TECHNIQUE: Sequential images were obtained following intradermal injection of radiopharmaceutical at the tumor site in the LEFT temporal region. RADIOPHARMACEUTICALS:  0.5 mCi millipore-filtered Tc-34m sulfur colloid COMPARISON:  PET-CT scan 06/13/2019 FINDINGS: Activity at the injection site in the LEFT temporal region. Lymph node maps to the inferolateral LEFT neck an anterior location (LEFT level III versus supraclavicular node). IMPRESSION: Lymph node maps to the inferolateral LEFT neck as above. Electronically Signed   By: Suzy Bouchard M.D.   On: 07/09/2019 10:34    Assessment and Plan:    86 year old man with:  1.  Melanoma of the face diagnosed and December 2020 0.8.  He presented with a pigmented lesion that was biopsy-proven to show melanoma with positive margins and at least 6 mm depth of invasion  indicating at least T4b lesion.  PET CT scan did not show any evidence of metastatic disease.  The natural course of this disease was reviewed as well as treatment options were discussed.  The standard of care at this time would be local excision with sentinel lymph node sampling at this time.  Given the extent of surgery it is required on his face, this might put him at a high risk of anesthesia complications given his dementia.  Alternative options at this time would be active surveillance which is not recommended at this time given the fact that he is already experiencing regrowth of that lesion rather rapidly.  The role of systemic therapy was also discussed in this particular setting and I do not feel that it is ideal.  Systemic immunotherapy does have some activity against gross tumor although it is not indicated or localized lesions.  It would be reasonable to use adjuvant immunotherapy after surgery or if he has developed metastatic disease  Although the surgery does carry an increased risk given his age and comorbid conditions, I fear that this is his only reasonable option to treat this disease effectively.  I would defer systemic therapy unless he has metastatic disease.  He prefers to proceed with surgery at this time will discuss that with Dr. Constance Holster.  The other advantage of proceeding with surgery at this time would be local control which is unlikely to be achieved by systemic therapy at this time.   2.  Follow-up: Would be in the next few months after surgery to discuss any additional therapy if needed.   60  minutes were dedicated to this visit. The time was spent on reviewing  imaging studies, discussing treatment options, discussing differential diagnosis and answering questions regarding future plan.    A copy of this consult has been forwarded to the requesting physician.

## 2019-07-28 ENCOUNTER — Telehealth: Payer: Self-pay | Admitting: Oncology

## 2019-07-28 ENCOUNTER — Institutional Professional Consult (permissible substitution): Payer: Medicare Other | Admitting: Plastic Surgery

## 2019-07-28 NOTE — Telephone Encounter (Signed)
Scheduled appt per 3/26 los.  Sent a message to HIM pool to get a calendar mailed out. 

## 2019-08-11 NOTE — H&P (Signed)
HPI:   Kevin Lynch is a 86 y.o. male who presents as a new Patient.   Referring Provider: Self, A Referral  Chief complaint: Melanoma.  HPI: History of multiple skin cancers, melanoma on his right chest about 15 years ago, multiple basal cell cancers, including a recurrent one on the nasal dorsal skin. He had a pigmented lesion in the left cheek area. He had a shave biopsy a few weeks ago. This revealed melanoma, at least 5 mm in depth. I do not have the actual report. He is referred here for possible surgical management. He has a history of valvular heart disease, stroke, COPD, remote smoking history, hypertension, progressive dementia. He is still able to stay by himself during the day but his wife has installed a camera system around the house. He can still bathe and walk the dogs and cook for himself.  PMH/Meds/All/SocHx/FamHx/ROS:   Past Medical History:  Diagnosis Date  . Cancer ([place])  . COPD (chronic obstructive pulmonary disease) ([place])   Past Surgical History:  Procedure Laterality Date  . PROSTATE SURGERY   No family history of bleeding disorders, wound healing problems or difficulty with anesthesia.   Social History   Socioeconomic History  . Marital status: Married  Spouse name: Not on file  . Number of children: Not on file  . Years of education: Not on file  . Highest education level: Not on file  Occupational History  . Not on file  Social Needs  . Financial resource strain: Not on file  . Food insecurity  Worry: Not on file  Inability: Not on file  . Transportation needs  Medical: Not on file  Non-medical: Not on file  Tobacco Use  . Smoking status: Former Research scientist (life sciences)  . Smokeless tobacco: Never Used  Substance and Sexual Activity  . Alcohol use: No  . Drug use: No  . Sexual activity: Not on file  Lifestyle  . Physical activity  Days per week: Not on file  Minutes per session: Not on file  . Stress: Not on file  Relationships  . Social Clinical research associate on phone: Not on file  Gets together: Not on file  Attends religious service: Not on file  Active member of club or organization: Not on file  Attends meetings of clubs or organizations: Not on file  Relationship status: Not on file  Other Topics Concern  . Not on file  Social History Narrative  . Not on file   A complete ROS was performed with pertinent positives/negatives noted in the HPI. The remainder of the ROS are negative.   Physical Exam:   BP 131/71  Pulse 91  Temp 97.3 F (36.3 C)  Ht 1.6 m (5\' 3" )  Wt 78.9 kg (174 lb)  BMI 30.82 kg/m   General: Very quiet elderly gentleman, in no distress, breathing easily. Normal affect. In a pleasant mood. Head: Normocephalic, atraumatic. No masses, or scars. Eyes: Pupils are equal, and reactive to light. Vision is grossly intact. No spontaneous or gaze nystagmus. Ears: Ear canals are clear. Tympanic membranes are intact, with normal landmarks and the middle ears are clear and healthy. Hearing: Grossly normal. Nose: Nasal cavities are clear with healthy mucosa, no polyps or exudate. Airways are patent. Surgical defect of the right nasal ala and a small nodular mass involving the right side of the lower nasal dorsum. Face: Small pigmented lesion over the left posterior cheek area, preauricular area, without any palpable mass, facial nerve function is symmetric. Oral  Cavity: No mucosal abnormalities are noted. Tongue with normal mobility. Dentition in poor repair. Oropharynx: Tonsils are symmetric. There are no mucosal masses identified. Tongue base appears normal and healthy. Larynx/Hypopharynx: deferred Chest: Deferred Neck: No palpable masses in the parotid, but there does appear to be a firm left level 2 lymph node, no other cervical adenopathy, no thyroid nodules or enlargement. Neuro: Cranial nerves II-XII with normal function. Balance: Very slow gate. Other findings: none.  Independent Review of Additional Tests or  Records:  none  Procedures:  none  Impression & Plans:  Melanoma, left cheek area, at least 5 mm depth. We had a lengthy discussion that under normal circumstances a wide resection with sentinel node biopsy and probable adjuvant therapy would be treatment of choice for this. Since there is a palpable node that changes things a little bit. I would recommend we do a CT and PET scan to get more information about regional and distant disease and then I had a lengthy discussion with the wife about possibility of treating this surgically versus not treating this. Given his advanced age and dementia, prolonged anesthesia would be very difficult for him. We will talk more after the imaging is back.

## 2019-08-12 ENCOUNTER — Institutional Professional Consult (permissible substitution): Payer: Medicare Other | Admitting: Plastic Surgery

## 2019-08-14 ENCOUNTER — Encounter (HOSPITAL_COMMUNITY): Payer: Self-pay

## 2019-08-14 NOTE — Progress Notes (Signed)
[place] [place] [address] Phone: [phone] Fax: [phone]      Your procedure is scheduled on Tuesday, August 19, 2019.  Report to [facility] Main Entrance "A" at 5:30 A.M., and check in at the Admitting office.  Call this number if you have problems the morning of surgery:  [phone]  Call [phone] if you have any questions prior to your surgery date Monday-Friday 8am-4pm    Remember:  Do not eat or drink after midnight the night before your surgery    Take these medicines the morning of surgery with A SIP OF WATER:  BREO ELLIPTA  escitalopram (LEXAPRO) levocetirizine (XYZAL) memantine (NAMENDA)  metoprolol succinate (TOPROL-XL) spironolactone (ALDACTONE) albuterol (PROVENTIL) - if needed Loperamide HCl (IMODIUM PO) - if needed   Please bring all inhalers with you the day of surgery.    As of today, STOP taking any Aspirin (unless otherwise instructed by your surgeon) and Aspirin containing products, Aleve, Naproxen, Ibuprofen, Motrin, Advil, Goody's, BC's, all herbal medications, fish oil, and all vitamins.                      Do not wear jewelry.            Do not wear lotions, powders, colognes, or deodorant.            Men may shave face and neck.            Do not bring valuables to the hospital.            [facility] is not responsible for any belongings or valuables.  Do NOT Smoke (Tobacco/Vapping) or drink Alcohol 24 hours prior to your procedure If you use a CPAP at night, you may bring all equipment for your overnight stay.   Contacts, glasses, dentures or bridgework may not be worn into surgery.      For patients admitted to the hospital, discharge time will be determined by your treatment team.   Patients discharged the day of surgery will not be allowed to drive home, and someone needs to stay with them for 24 hours.    Special instructions:   [place]-  Preparing For Surgery  Before surgery, you can play an important role. Because skin is not sterile, your skin needs to be as free of germs as possible. You can reduce the number of germs on your skin by washing with CHG (chlorahexidine gluconate) Soap before surgery.  CHG is an antiseptic cleaner which kills germs and bonds with the skin to continue killing germs even after washing.    Oral Hygiene is also important to reduce your risk of infection.  Remember - BRUSH YOUR TEETH THE MORNING OF SURGERY WITH YOUR REGULAR TOOTHPASTE  Please do not use if you have an allergy to CHG or antibacterial soaps. If your skin becomes reddened/irritated stop using the CHG.  Do not shave (including legs and underarms) for at least 48 hours prior to first CHG shower. It is OK to shave your face.  Please follow these instructions carefully.   1. Shower the NIGHT BEFORE SURGERY and the MORNING OF SURGERY with CHG Soap.   2. If you chose to wash your hair, wash your hair first as usual with your normal shampoo.  3. After you shampoo, rinse your hair and body thoroughly to remove the shampoo.  4. Use CHG as you would any other liquid  soap. You can apply CHG directly to the skin and wash gently with a scrungie or a clean washcloth.   5. Apply the CHG Soap to your body ONLY FROM THE NECK DOWN.  Do not use on open wounds or open sores. Avoid contact with your eyes, ears, mouth and genitals (private parts). Wash Face and genitals (private parts)  with your normal soap.   6. Wash thoroughly, paying special attention to the area where your surgery will be performed.  7. Thoroughly rinse your body with warm water from the neck down.  8. DO NOT shower/wash with your normal soap after using and rinsing off the CHG Soap.  9. Pat yourself dry with a CLEAN TOWEL.  10. Wear CLEAN PAJAMAS to bed the night before surgery, wear comfortable clothes the morning of surgery  11. Place CLEAN SHEETS on your bed the night of  your first shower and DO NOT SLEEP WITH PETS.   Day of Surgery:   Do not apply any deodorants/lotions.  Please wear clean clothes to the hospital/surgery center.   Remember to brush your teeth WITH YOUR REGULAR TOOTHPASTE.   Please read over the following fact sheets that you were given.

## 2019-08-14 NOTE — Progress Notes (Signed)
Patient denies shortness of breath, fever, cough and chest pain.  PCP - Ihor Dow, PA Cardiologist - Dr Bettina Gavia  Chest x-ray - n/a EKG - 08/15/19 Stress Test - n/a ECHO - 07/04/17 Cardiac Cath - n/a  Fasting Blood Sugar - Unknown Checks Blood Sugar _0_ times a day Pre-Diabetic, no medications.  Blood Thinner Instructions: Plavix last dose on Wednesday, 08/13/19  Anesthesia review: Yes  Coronavirus Screening Covid Test scheduled on 08/15/19 at 0935. Have you experienced the following symptoms:  Cough yes/no: No Fever (>100.39F)  yes/no: No Runny nose yes/no: No Sore throat yes/no: No Difficulty breathing/shortness of breath  yes/no: No  Have you traveled in the last 14 days and where? yes/no: No  Patient verbalized understanding of instructions that were given to them at the PAT appointment.

## 2019-08-15 ENCOUNTER — Encounter (HOSPITAL_COMMUNITY)
Admission: RE | Admit: 2019-08-15 | Discharge: 2019-08-15 | Disposition: A | Discharge status: Home / Self Care | Payer: Medicare Other | Source: Ambulatory Visit | Attending: Otolaryngology | Admitting: Otolaryngology

## 2019-08-15 ENCOUNTER — Other Ambulatory Visit: Payer: Self-pay

## 2019-08-15 ENCOUNTER — Other Ambulatory Visit (HOSPITAL_COMMUNITY)
Admission: RE | Admit: 2019-08-15 | Discharge: 2019-08-15 | Disposition: A | Discharge status: Home / Self Care | Payer: Medicare Other | Source: Ambulatory Visit | Attending: Otolaryngology | Admitting: Otolaryngology

## 2019-08-15 ENCOUNTER — Encounter (HOSPITAL_COMMUNITY): Payer: Self-pay

## 2019-08-15 DIAGNOSIS — Z01818 Encounter for other preprocedural examination: Secondary | ICD-10-CM | POA: Insufficient documentation

## 2019-08-15 DIAGNOSIS — C760 Malignant neoplasm of head, face and neck: Secondary | ICD-10-CM | POA: Insufficient documentation

## 2019-08-15 DIAGNOSIS — E538 Deficiency of other specified B group vitamins: Secondary | ICD-10-CM | POA: Diagnosis not present

## 2019-08-15 DIAGNOSIS — Z87891 Personal history of nicotine dependence: Secondary | ICD-10-CM | POA: Diagnosis not present

## 2019-08-15 DIAGNOSIS — Z79899 Other long term (current) drug therapy: Secondary | ICD-10-CM | POA: Diagnosis not present

## 2019-08-15 DIAGNOSIS — Z20822 Contact with and (suspected) exposure to covid-19: Secondary | ICD-10-CM | POA: Diagnosis not present

## 2019-08-15 DIAGNOSIS — C439 Malignant melanoma of skin, unspecified: Secondary | ICD-10-CM | POA: Diagnosis not present

## 2019-08-15 DIAGNOSIS — I110 Hypertensive heart disease with heart failure: Secondary | ICD-10-CM | POA: Diagnosis not present

## 2019-08-15 DIAGNOSIS — I700 Atherosclerosis of aorta: Secondary | ICD-10-CM | POA: Diagnosis not present

## 2019-08-15 DIAGNOSIS — E785 Hyperlipidemia, unspecified: Secondary | ICD-10-CM | POA: Diagnosis not present

## 2019-08-15 DIAGNOSIS — E559 Vitamin D deficiency, unspecified: Secondary | ICD-10-CM | POA: Diagnosis not present

## 2019-08-15 DIAGNOSIS — I509 Heart failure, unspecified: Secondary | ICD-10-CM | POA: Diagnosis not present

## 2019-08-15 DIAGNOSIS — J449 Chronic obstructive pulmonary disease, unspecified: Secondary | ICD-10-CM | POA: Insufficient documentation

## 2019-08-15 HISTORY — DX: Unspecified osteoarthritis, unspecified site: M19.90

## 2019-08-15 HISTORY — DX: Presence of spectacles and contact lenses: Z97.3

## 2019-08-15 HISTORY — DX: Unspecified cataract: H26.9

## 2019-08-15 HISTORY — DX: Prediabetes: R73.03

## 2019-08-15 HISTORY — DX: Depression, unspecified: F32.A

## 2019-08-15 HISTORY — DX: Pneumonia, unspecified organism: J18.9

## 2019-08-15 HISTORY — DX: Unspecified dementia, unspecified severity, without behavioral disturbance, psychotic disturbance, mood disturbance, and anxiety: F03.90

## 2019-08-15 HISTORY — DX: Personal history of other medical treatment: Z92.89

## 2019-08-15 HISTORY — DX: Personal history of nicotine dependence: Z87.891

## 2019-08-15 LAB — BASIC METABOLIC PANEL
Anion gap: 9 (ref 5–15)
BUN: 21 mg/dL (ref 8–23)
CO2: 23 mmol/L (ref 22–32)
Calcium: 8.6 mg/dL — ABNORMAL LOW (ref 8.9–10.3)
Chloride: 110 mmol/L (ref 98–111)
Creatinine, Ser: 1.63 mg/dL — ABNORMAL HIGH (ref 0.61–1.24)
GFR calc Af Amer: 44 mL/min — ABNORMAL LOW (ref >60)
GFR calc non Af Amer: 38 mL/min — ABNORMAL LOW (ref >60)
Glucose, Bld: 170 mg/dL — ABNORMAL HIGH (ref 70–99)
Potassium: 4.2 mmol/L (ref 3.5–5.1)
Sodium: 142 mmol/L (ref 135–145)

## 2019-08-15 LAB — CBC
HCT: 46.5 % (ref 39.0–52.0)
Hemoglobin: 14.5 g/dL (ref 13.0–17.0)
MCH: 30.7 pg (ref 26.0–34.0)
MCHC: 31.2 g/dL (ref 30.0–36.0)
MCV: 98.5 fL (ref 80.0–100.0)
Platelets: 226 10*3/uL (ref 150–400)
RBC: 4.72 MIL/uL (ref 4.22–5.81)
RDW: 12.9 % (ref 11.5–15.5)
WBC: 10.5 10*3/uL (ref 4.0–10.5)
nRBC: 0 % (ref 0.0–0.2)

## 2019-08-15 LAB — GLUCOSE, CAPILLARY: Glucose-Capillary: 163 mg/dL — ABNORMAL HIGH (ref 70–99)

## 2019-08-15 LAB — SARS CORONAVIRUS 2 (TAT 6-24 HRS): SARS Coronavirus 2: NEGATIVE

## 2019-08-18 NOTE — Anesthesia Preprocedure Evaluation (Addendum)
Anesthesia Evaluation  Patient identified by MRN, date of birth, ID band Patient awake    Reviewed: Allergy & Precautions, NPO status , Patient's Chart, lab work & pertinent test results, reviewed documented beta blocker date and time   History of Anesthesia Complications Negative for: history of anesthetic complications  Airway Mallampati: III  TM Distance: >3 FB Neck ROM: Full    Dental  (+) Teeth Intact, Dental Advisory Given   Pulmonary shortness of breath, neg sleep apnea, COPD, neg recent URI, former smoker,   Seen by cardiologist Dr Loney Hering 02/01/18 for DOE and diastolic dysfunction by echo. Per note, "Whatever element of heart failure had in the past looks well compensated he has no fluid overload and does not require loop diuretic. Clinically I think his shortness of breath is pulmonary in etiology he has been seen previously by pulmonary and strongly encouraged to follow-up again as these individuals can require treatment for interstitial pneumonitis to prevent progression."  He was last seen by pulmonologist Dr. Lake Bells 06/26/17 for COPD/Chronic bronchitis. Spirometry was performed at that visit which was normal.  CT scan 02/06/18 suspicious for usual interstitial pneumonitis or nonspecific interstitial pneumonitis. It does not appear pt has followed up with pulmonology since that scan.    breath sounds clear to auscultation       Cardiovascular hypertension, Pt. on medications and Pt. on home beta blockers +CHF   Rhythm:Regular  06/2017:\ - Procedure narrative: Transthoracic echocardiography. Image  quality was adequate. Intravenous contrast (Definity) was  administered.  - Left ventricle: The cavity size was normal. There was moderate  focal basal and mild concentric hypertrophy. Systolic function  was normal. The estimated ejection fraction was in the range of  60% to 65%. Wall motion was normal; there were no  regional wall  motion abnormalities. There was an increased relative  contribution of atrial contraction to ventricular filling.  Doppler parameters are consistent with abnormal left ventricular  relaxation (grade 1 diastolic dysfunction). Doppler parameters  are consistent with high ventricular filling pressure.  - Aorta: Ascending aortic diameter: 38 mm (S).  - Ascending aorta: The ascending aorta was mildly dilated.  - Mitral valve: Calcified annulus.  - Atrial septum: There was increased thickness of the septum,  consistent with lipomatous hypertrophy.  - Pulmonary arteries: Systolic pressure could not be accurately  estimated.    Neuro/Psych PSYCHIATRIC DISORDERS Depression Dementia CVA    GI/Hepatic Neg liver ROS, PUD,   Endo/Other    Renal/GU CRFRenal disease     Musculoskeletal  (+) Arthritis ,   Abdominal   Peds  Hematology negative hematology ROS (+)   Anesthesia Other Findings   Reproductive/Obstetrics                            Anesthesia Physical Anesthesia Plan  ASA: III  Anesthesia Plan: General   Post-op Pain Management:    Induction: Intravenous  PONV Risk Score and Plan: 2 and Ondansetron and Dexamethasone  Airway Management Planned: Oral ETT  Additional Equipment: None  Intra-op Plan:   Post-operative Plan: Extubation in OR  Informed Consent: I have reviewed the patients History and Physical, chart, labs and discussed the procedure including the risks, benefits and alternatives for the proposed anesthesia with the patient or authorized representative who has indicated his/her understanding and acceptance.     Dental advisory given  Plan Discussed with: CRNA and Surgeon  Anesthesia Plan Comments: (Seen by cardiologist Dr Loney Hering 02/01/18 for  DOE and diastolic dysfunction by echo. Per note, "Whatever element of heart failure had in the past looks well compensated he has no fluid overload and does not  require loop diuretic.  Clinically I think his shortness of breath is pulmonary in etiology he has been seen previously by pulmonary and strongly encouraged to follow-up again as these individuals can require treatment for interstitial pneumonitis to prevent progression."  He was last seen by pulmonologist Dr. Lake Bells 06/26/17 for COPD/Chronic bronchitis. Spirometry was performed at that visit which was normal.  CT scan 02/06/18 suspicious for usual interstitial pneumonitis or nonspecific interstitial pneumonitis. It does not appear pt has followed up with pulmonology since that scan.   He is on Plavix for hx of CVA. LD reported 08/13/19.   No SOB reported at PAT appt. O2 sats 98% on RA.   Preop labs reviewed. Creatinine elevated at 1.63 which appears to be near baseline (last creatinine in Epic was 1.62 on 07/04/17). BG elevated at 170. Pt reportedly pre diabetic, on no antiDM meds.   EKG 08/15/19: Sinus rhythm with 1st degree A-V block. Rate 61.  TTE 07/04/17: - Procedure narrative: Transthoracic echocardiography. Image  quality was adequate. Intravenous contrast (Definity) was  administered.  - Left ventricle: The cavity size was normal. There was moderate  focal basal and mild concentric hypertrophy. Systolic function  was normal. The estimated ejection fraction was in the range of  60% to 65%. Wall motion was normal; there were no regional wall  motion abnormalities. There was an increased relative  contribution of atrial contraction to ventricular filling.  Doppler parameters are consistent with abnormal left ventricular  relaxation (grade 1 diastolic dysfunction). Doppler parameters  are consistent with high ventricular filling pressure.  - Aorta: Ascending aortic diameter: 38 mm (S).  - Ascending aorta: The ascending aorta was mildly dilated.  - Mitral valve: Calcified annulus.  - Atrial septum: There was increased thickness of the septum,  consistent with  lipomatous hypertrophy.  - Pulmonary arteries: Systolic pressure could not be accurately  estimated. )       Anesthesia Quick Evaluation

## 2019-08-18 NOTE — Progress Notes (Signed)
Anesthesia Chart Review:  Seen by cardiologist Dr Loney Hering 02/01/18 for DOE and diastolic dysfunction by echo. Per note, "Whatever element of heart failure had in the past looks well compensated he has no fluid overload and does not require loop diuretic.  Clinically I think his shortness of breath is pulmonary in etiology he has been seen previously by pulmonary and strongly encouraged to follow-up again as these individuals can require treatment for interstitial pneumonitis to prevent progression."  He was last seen by pulmonologist Dr. Lake Bells 06/26/17 for COPD/Chronic bronchitis. Spirometry was performed at that visit which was normal.  CT scan 02/06/18 suspicious for usual interstitial pneumonitis or nonspecific interstitial pneumonitis. It does not appear pt has followed up with pulmonology since that scan.   He is on Plavix for hx of CVA. LD reported 08/13/19.   No SOB reported at PAT appt. O2 sats 98% on RA.   Preop labs reviewed. Creatinine elevated at 1.63 which appears to be near baseline (last creatinine in Epic was 1.62 on 07/04/17). BG elevated at 170. Pt reportedly pre diabetic, on no antiDM meds.   EKG 08/15/19: Sinus rhythm with 1st degree A-V block. Rate 61.  TTE 07/04/17: - Procedure narrative: Transthoracic echocardiography. Image  quality was adequate. Intravenous contrast (Definity) was  administered.  - Left ventricle: The cavity size was normal. There was moderate  focal basal and mild concentric hypertrophy. Systolic function  was normal. The estimated ejection fraction was in the range of  60% to 65%. Wall motion was normal; there were no regional wall  motion abnormalities. There was an increased relative  contribution of atrial contraction to ventricular filling.  Doppler parameters are consistent with abnormal left ventricular  relaxation (grade 1 diastolic dysfunction). Doppler parameters  are consistent with high ventricular filling pressure.  -  Aorta: Ascending aortic diameter: 38 mm (S).  - Ascending aorta: The ascending aorta was mildly dilated.  - Mitral valve: Calcified annulus.  - Atrial septum: There was increased thickness of the septum,  consistent with lipomatous hypertrophy.  - Pulmonary arteries: Systolic pressure could not be accurately  estimated.    Wynonia Musty [facility] Short Stay Center/Anesthesiology Phone [phone] 08/18/2019 10:12 AM

## 2019-08-19 ENCOUNTER — Ambulatory Visit (HOSPITAL_COMMUNITY): Payer: Medicare Other | Admitting: Certified Registered"

## 2019-08-19 ENCOUNTER — Encounter (HOSPITAL_COMMUNITY)
Admission: RE | Disposition: A | Discharge status: Home / Self Care | Payer: Self-pay | Source: Home / Self Care | Attending: Otolaryngology

## 2019-08-19 ENCOUNTER — Other Ambulatory Visit: Payer: Self-pay

## 2019-08-19 ENCOUNTER — Ambulatory Visit (HOSPITAL_COMMUNITY): Payer: Medicare Other | Admitting: Physician Assistant

## 2019-08-19 ENCOUNTER — Inpatient Hospital Stay (HOSPITAL_COMMUNITY)
Admission: RE | Admit: 2019-08-19 | Discharge: 2019-08-20 | DRG: 578 | Disposition: A | Discharge status: Home / Self Care | Payer: Medicare Other | Attending: Otolaryngology | Admitting: Otolaryngology

## 2019-08-19 ENCOUNTER — Encounter (HOSPITAL_COMMUNITY): Payer: Self-pay | Admitting: Otolaryngology

## 2019-08-19 DIAGNOSIS — J449 Chronic obstructive pulmonary disease, unspecified: Secondary | ICD-10-CM | POA: Diagnosis not present

## 2019-08-19 DIAGNOSIS — C4330 Malignant melanoma of unspecified part of face: Principal | ICD-10-CM | POA: Diagnosis present

## 2019-08-19 DIAGNOSIS — Z87891 Personal history of nicotine dependence: Secondary | ICD-10-CM | POA: Diagnosis not present

## 2019-08-19 DIAGNOSIS — I5030 Unspecified diastolic (congestive) heart failure: Secondary | ICD-10-CM | POA: Diagnosis not present

## 2019-08-19 DIAGNOSIS — Z85828 Personal history of other malignant neoplasm of skin: Secondary | ICD-10-CM

## 2019-08-19 DIAGNOSIS — Z8673 Personal history of transient ischemic attack (TIA), and cerebral infarction without residual deficits: Secondary | ICD-10-CM | POA: Diagnosis not present

## 2019-08-19 DIAGNOSIS — I130 Hypertensive heart and chronic kidney disease with heart failure and stage 1 through stage 4 chronic kidney disease, or unspecified chronic kidney disease: Secondary | ICD-10-CM | POA: Diagnosis not present

## 2019-08-19 DIAGNOSIS — I10 Essential (primary) hypertension: Secondary | ICD-10-CM | POA: Diagnosis not present

## 2019-08-19 DIAGNOSIS — N189 Chronic kidney disease, unspecified: Secondary | ICD-10-CM | POA: Diagnosis not present

## 2019-08-19 DIAGNOSIS — Z888 Allergy status to other drugs, medicaments and biological substances status: Secondary | ICD-10-CM

## 2019-08-19 DIAGNOSIS — Z91013 Allergy to seafood: Secondary | ICD-10-CM | POA: Diagnosis not present

## 2019-08-19 DIAGNOSIS — Z20822 Contact with and (suspected) exposure to covid-19: Secondary | ICD-10-CM | POA: Diagnosis not present

## 2019-08-19 DIAGNOSIS — C439 Malignant melanoma of skin, unspecified: Secondary | ICD-10-CM | POA: Diagnosis present

## 2019-08-19 DIAGNOSIS — C4339 Malignant melanoma of other parts of face: Secondary | ICD-10-CM | POA: Diagnosis not present

## 2019-08-19 DIAGNOSIS — F0390 Unspecified dementia without behavioral disturbance: Secondary | ICD-10-CM | POA: Diagnosis present

## 2019-08-19 HISTORY — PX: PAROTIDECTOMY: SUR1003

## 2019-08-19 HISTORY — PX: SENTINEL LYMPH NODE BIOPSY: SHX2392

## 2019-08-19 HISTORY — PX: SKIN FULL THICKNESS GRAFT: SHX442

## 2019-08-19 HISTORY — PX: PAROTIDECTOMY: SHX2163

## 2019-08-19 HISTORY — PX: LYMPH NODE BIOPSY: SHX201

## 2019-08-19 HISTORY — PX: LESION EXCISION WITH COMPLEX REPAIR: SHX6700

## 2019-08-19 LAB — GLUCOSE, CAPILLARY: Glucose-Capillary: 109 mg/dL — ABNORMAL HIGH (ref 70–99)

## 2019-08-19 SURGERY — EXCISION, PAROTID GLAND
Anesthesia: General | Site: Face | Laterality: Left

## 2019-08-19 MED ORDER — OXYCODONE HCL 5 MG PO TABS: 5.0000 mg | ORAL_TABLET | Freq: Once | ORAL | Status: DC | PRN

## 2019-08-19 MED ORDER — MEMANTINE HCL 10 MG PO TABS
10.0000 mg | ORAL_TABLET | Freq: Two times a day (BID) | ORAL | Status: DC
  Administered 2019-08-19: 10 mg via ORAL
  Filled 2019-08-19: qty 1

## 2019-08-19 MED ORDER — DEXTROSE-NACL 5-0.9 % IV SOLN: INTRAVENOUS | Status: DC

## 2019-08-19 MED ORDER — LORATADINE 10 MG PO TABS: 10.0000 mg | ORAL_TABLET | Freq: Every day | ORAL | Status: DC

## 2019-08-19 MED ORDER — CIPROFLOXACIN-DEXAMETHASONE 0.3-0.1 % OT SUSP
OTIC | Status: AC
  Filled 2019-08-19: qty 7.5

## 2019-08-19 MED ORDER — FENTANYL CITRATE (PF) 100 MCG/2ML IJ SOLN: 25.0000 ug | INTRAMUSCULAR | Status: DC | PRN

## 2019-08-19 MED ORDER — COQ-10 100 MG PO CAPS: 100.0000 mg | ORAL_CAPSULE | Freq: Every day | ORAL | Status: DC

## 2019-08-19 MED ORDER — IBUPROFEN 100 MG/5ML PO SUSP: 400.0000 mg | Freq: Four times a day (QID) | ORAL | Status: DC | PRN

## 2019-08-19 MED ORDER — ALBUTEROL SULFATE (2.5 MG/3ML) 0.083% IN NEBU: 2.5000 mg | INHALATION_SOLUTION | Freq: Four times a day (QID) | RESPIRATORY_TRACT | Status: DC | PRN

## 2019-08-19 MED ORDER — PROPOFOL 10 MG/ML IV BOLUS
INTRAVENOUS | Status: AC
  Filled 2019-08-19: qty 20

## 2019-08-19 MED ORDER — SODIUM CHLORIDE 0.9 % IV SOLN: INTRAVENOUS | Status: DC | PRN

## 2019-08-19 MED ORDER — ROCURONIUM BROMIDE 10 MG/ML (PF) SYRINGE
PREFILLED_SYRINGE | INTRAVENOUS | Status: DC | PRN
  Administered 2019-08-19: 70 mg via INTRAVENOUS

## 2019-08-19 MED ORDER — LIDOCAINE 2% (20 MG/ML) 5 ML SYRINGE
INTRAMUSCULAR | Status: AC
  Filled 2019-08-19: qty 5

## 2019-08-19 MED ORDER — LIDOCAINE 2% (20 MG/ML) 5 ML SYRINGE
INTRAMUSCULAR | Status: DC | PRN
  Administered 2019-08-19: 60 mg via INTRAVENOUS

## 2019-08-19 MED ORDER — EPHEDRINE SULFATE-NACL 50-0.9 MG/10ML-% IV SOSY
PREFILLED_SYRINGE | INTRAVENOUS | Status: DC | PRN
  Administered 2019-08-19: 15 mg via INTRAVENOUS
  Administered 2019-08-19: 10 mg via INTRAVENOUS

## 2019-08-19 MED ORDER — PROMETHAZINE HCL 25 MG RE SUPP
25.0000 mg | Freq: Four times a day (QID) | RECTAL | Status: DC | PRN
  Filled 2019-08-19: qty 1

## 2019-08-19 MED ORDER — FLUTICASONE FUROATE-VILANTEROL 100-25 MCG/INH IN AEPB
1.0000 | INHALATION_SPRAY | Freq: Every day | RESPIRATORY_TRACT | Status: DC
  Filled 2019-08-19 (×2): qty 28

## 2019-08-19 MED ORDER — PROPOFOL 1000 MG/100ML IV EMUL
INTRAVENOUS | Status: AC
  Filled 2019-08-19: qty 100

## 2019-08-19 MED ORDER — LACTATED RINGERS IV SOLN: INTRAVENOUS | Status: DC | PRN

## 2019-08-19 MED ORDER — BACITRACIN ZINC 500 UNIT/GM EX OINT
TOPICAL_OINTMENT | CUTANEOUS | Status: AC
  Filled 2019-08-19: qty 28.35

## 2019-08-19 MED ORDER — LIDOCAINE-EPINEPHRINE 1 %-1:100000 IJ SOLN
INTRAMUSCULAR | Status: AC
  Filled 2019-08-19: qty 1

## 2019-08-19 MED ORDER — ONDANSETRON HCL 4 MG/2ML IJ SOLN
INTRAMUSCULAR | Status: AC
  Filled 2019-08-19: qty 2

## 2019-08-19 MED ORDER — TERAZOSIN HCL 5 MG PO CAPS
5.0000 mg | ORAL_CAPSULE | Freq: Every day | ORAL | Status: DC
  Administered 2019-08-19: 5 mg via ORAL
  Filled 2019-08-19: qty 1

## 2019-08-19 MED ORDER — HYDROCODONE-ACETAMINOPHEN 5-325 MG PO TABS
1.0000 | ORAL_TABLET | ORAL | Status: DC | PRN
  Administered 2019-08-20: 1 via ORAL
  Filled 2019-08-19: qty 1

## 2019-08-19 MED ORDER — GLYCOPYRROLATE PF 0.2 MG/ML IJ SOSY
PREFILLED_SYRINGE | INTRAMUSCULAR | Status: DC | PRN
  Administered 2019-08-19 (×2): .1 mg via INTRAVENOUS

## 2019-08-19 MED ORDER — METHYLENE BLUE 0.5 % INJ SOLN
INTRAVENOUS | Status: DC | PRN
  Administered 2019-08-19: 2 mL via SUBMUCOSAL

## 2019-08-19 MED ORDER — PROPOFOL 10 MG/ML IV BOLUS
INTRAVENOUS | Status: DC | PRN
  Administered 2019-08-19: 100 mg via INTRAVENOUS
  Administered 2019-08-19: 20 mg via INTRAVENOUS

## 2019-08-19 MED ORDER — LEVOCETIRIZINE DIHYDROCHLORIDE 5 MG PO TABS: 5.0000 mg | ORAL_TABLET | ORAL | Status: DC

## 2019-08-19 MED ORDER — OXYCODONE HCL 5 MG/5ML PO SOLN: 5.0000 mg | Freq: Once | ORAL | Status: DC | PRN

## 2019-08-19 MED ORDER — PROPOFOL 500 MG/50ML IV EMUL
INTRAVENOUS | Status: DC | PRN
  Administered 2019-08-19: 50 ug/kg/min via INTRAVENOUS

## 2019-08-19 MED ORDER — ESCITALOPRAM OXALATE 20 MG PO TABS
20.0000 mg | ORAL_TABLET | Freq: Every day | ORAL | Status: DC
  Administered 2019-08-19: 20 mg via ORAL
  Filled 2019-08-19: qty 1

## 2019-08-19 MED ORDER — GLYCOPYRROLATE PF 0.2 MG/ML IJ SOSY
PREFILLED_SYRINGE | INTRAMUSCULAR | Status: AC
  Filled 2019-08-19: qty 1

## 2019-08-19 MED ORDER — ACETAMINOPHEN 500 MG PO TABS: 1000.0000 mg | ORAL_TABLET | Freq: Once | ORAL | Status: DC | PRN

## 2019-08-19 MED ORDER — FENTANYL CITRATE (PF) 250 MCG/5ML IJ SOLN
INTRAMUSCULAR | Status: AC
  Filled 2019-08-19: qty 5

## 2019-08-19 MED ORDER — DONEPEZIL HCL 10 MG PO TABS
10.0000 mg | ORAL_TABLET | Freq: Every day | ORAL | Status: DC
  Administered 2019-08-19: 10 mg via ORAL
  Filled 2019-08-19: qty 1

## 2019-08-19 MED ORDER — 0.9 % SODIUM CHLORIDE (POUR BTL) OPTIME
TOPICAL | Status: DC | PRN
  Administered 2019-08-19: 1000 mL

## 2019-08-19 MED ORDER — ONDANSETRON HCL 4 MG/2ML IJ SOLN
INTRAMUSCULAR | Status: DC | PRN
  Administered 2019-08-19: 4 mg via INTRAVENOUS

## 2019-08-19 MED ORDER — ACETAMINOPHEN 10 MG/ML IV SOLN: 1000.0000 mg | Freq: Once | INTRAVENOUS | Status: DC | PRN

## 2019-08-19 MED ORDER — PROMETHAZINE HCL 25 MG PO TABS: 25.0000 mg | ORAL_TABLET | Freq: Four times a day (QID) | ORAL | Status: DC | PRN

## 2019-08-19 MED ORDER — CYANOCOBALAMIN 1000 MCG/ML IJ SOLN
1000.0000 ug | INTRAMUSCULAR | Status: DC
  Filled 2019-08-19: qty 1

## 2019-08-19 MED ORDER — FENTANYL CITRATE (PF) 100 MCG/2ML IJ SOLN
INTRAMUSCULAR | Status: DC | PRN
  Administered 2019-08-19: 25 ug via INTRAVENOUS
  Administered 2019-08-19 (×2): 50 ug via INTRAVENOUS
  Administered 2019-08-19: 25 ug via INTRAVENOUS
  Administered 2019-08-19: 100 ug via INTRAVENOUS

## 2019-08-19 MED ORDER — DEXAMETHASONE SODIUM PHOSPHATE 10 MG/ML IJ SOLN
INTRAMUSCULAR | Status: AC
  Filled 2019-08-19: qty 1

## 2019-08-19 MED ORDER — METOPROLOL SUCCINATE ER 25 MG PO TB24
25.0000 mg | ORAL_TABLET | Freq: Every day | ORAL | Status: DC
  Administered 2019-08-19: 25 mg via ORAL
  Filled 2019-08-19: qty 1

## 2019-08-19 MED ORDER — PHENYLEPHRINE HCL-NACL 10-0.9 MG/250ML-% IV SOLN
INTRAVENOUS | Status: DC | PRN
  Administered 2019-08-19: 25 ug/min via INTRAVENOUS

## 2019-08-19 MED ORDER — SPIRONOLACTONE 25 MG PO TABS
25.0000 mg | ORAL_TABLET | Freq: Every day | ORAL | Status: DC
  Administered 2019-08-19: 25 mg via ORAL
  Filled 2019-08-19: qty 1

## 2019-08-19 MED ORDER — DEXAMETHASONE SODIUM PHOSPHATE 10 MG/ML IJ SOLN
INTRAMUSCULAR | Status: DC | PRN
  Administered 2019-08-19: 5 mg via INTRAVENOUS

## 2019-08-19 MED ORDER — SUGAMMADEX SODIUM 200 MG/2ML IV SOLN
INTRAVENOUS | Status: DC | PRN
  Administered 2019-08-19 (×2): 50 mg via INTRAVENOUS

## 2019-08-19 MED ORDER — EPHEDRINE 5 MG/ML INJ
INTRAVENOUS | Status: AC
  Filled 2019-08-19: qty 10

## 2019-08-19 MED ORDER — ROCURONIUM BROMIDE 10 MG/ML (PF) SYRINGE
PREFILLED_SYRINGE | INTRAVENOUS | Status: AC
  Filled 2019-08-19: qty 10

## 2019-08-19 MED ORDER — BACITRACIN ZINC 500 UNIT/GM EX OINT
1.0000 "application " | TOPICAL_OINTMENT | Freq: Three times a day (TID) | CUTANEOUS | Status: DC
  Administered 2019-08-19 – 2019-08-20 (×3): 1 via TOPICAL
  Filled 2019-08-19 (×2): qty 28.35

## 2019-08-19 MED ORDER — METHYLENE BLUE 0.5 % INJ SOLN
INTRAVENOUS | Status: AC
  Filled 2019-08-19: qty 10

## 2019-08-19 MED ORDER — BACITRACIN ZINC 500 UNIT/GM EX OINT
TOPICAL_OINTMENT | CUTANEOUS | Status: DC | PRN
  Administered 2019-08-19: 1 via TOPICAL

## 2019-08-19 MED ORDER — ACETAMINOPHEN 160 MG/5ML PO SOLN: 1000.0000 mg | Freq: Once | ORAL | Status: DC | PRN

## 2019-08-19 MED ORDER — LIDOCAINE-EPINEPHRINE 1 %-1:100000 IJ SOLN
INTRAMUSCULAR | Status: DC | PRN
  Administered 2019-08-19: 7 mL

## 2019-08-19 SURGICAL SUPPLY — 66 items
APPLIER CLIP 9.375 SM OPEN (CLIP)
ATTRACTOMAT 16X20 MAGNETIC DRP (DRAPES) ×3 IMPLANT
BLADE CLIPPER SURG (BLADE) IMPLANT
BLADE SURG 10 STRL SS (BLADE) ×9 IMPLANT
BLADE SURG 15 STRL LF DISP TIS (BLADE) ×1 IMPLANT
BLADE SURG 15 STRL SS (BLADE) ×2
CANISTER SUCT 3000ML PPV (MISCELLANEOUS) ×3 IMPLANT
CLEANER TIP ELECTROSURG 2X2 (MISCELLANEOUS) ×3 IMPLANT
CLIP APPLIE 9.375 SM OPEN (CLIP) IMPLANT
CNTNR URN SCR LID CUP LEK RST (MISCELLANEOUS) IMPLANT
CONT SPEC 4OZ STRL OR WHT (MISCELLANEOUS)
CORD BIPOLAR FORCEPS 12FT (ELECTRODE) ×3 IMPLANT
COTTONBALL LRG STERILE PKG (GAUZE/BANDAGES/DRESSINGS) IMPLANT
COVER SURGICAL LIGHT HANDLE (MISCELLANEOUS) ×3 IMPLANT
COVER WAND RF STERILE (DRAPES) IMPLANT
DERMABOND ADVANCED (GAUZE/BANDAGES/DRESSINGS) ×2
DERMABOND ADVANCED .7 DNX12 (GAUZE/BANDAGES/DRESSINGS) ×1 IMPLANT
DRAIN HEMOVAC 7FR (DRAIN) IMPLANT
DRAIN SNY 10 ROU (WOUND CARE) ×3 IMPLANT
DRAIN WOUND SNY 15 RND (WOUND CARE) IMPLANT
DRAPE HALF SHEET 40X57 (DRAPES) IMPLANT
DRAPE INCISE 13X13 STRL (DRAPES) ×3 IMPLANT
DRAPE INCISE 23X17 IOBAN STRL (DRAPES) ×2
DRAPE INCISE IOBAN 23X17 STRL (DRAPES) ×1 IMPLANT
DRAPE MICROSCOPE LEICA 46X105 (MISCELLANEOUS) IMPLANT
DRESSING NASAL POPE 10X1.5X2.5 (GAUZE/BANDAGES/DRESSINGS) ×4 IMPLANT
DRSG NASAL POPE 10X1.5X2.5 (GAUZE/BANDAGES/DRESSINGS) ×12
ELECT COATED BLADE 2.86 ST (ELECTRODE) ×3 IMPLANT
ELECT REM PT RETURN 9FT ADLT (ELECTROSURGICAL) ×3
ELECTRODE REM PT RTRN 9FT ADLT (ELECTROSURGICAL) ×1 IMPLANT
EVACUATOR SILICONE 100CC (DRAIN) ×3 IMPLANT
FORCEPS BIPOLAR SPETZLER 8 1.0 (NEUROSURGERY SUPPLIES) ×3 IMPLANT
GAUZE 4X4 16PLY RFD (DISPOSABLE) ×6 IMPLANT
GAUZE SPONGE 4X4 12PLY STRL (GAUZE/BANDAGES/DRESSINGS) ×3 IMPLANT
GAUZE XEROFORM 5X9 LF (GAUZE/BANDAGES/DRESSINGS) ×3 IMPLANT
GLOVE ECLIPSE 7.5 STRL STRAW (GLOVE) ×3 IMPLANT
GOWN STRL NON-REIN LRG LVL3 (GOWN DISPOSABLE) ×6 IMPLANT
GOWN STRL REUS W/ TWL LRG LVL3 (GOWN DISPOSABLE) ×4 IMPLANT
GOWN STRL REUS W/TWL LRG LVL3 (GOWN DISPOSABLE) ×8
KIT BASIN OR (CUSTOM PROCEDURE TRAY) ×3 IMPLANT
KIT TURNOVER KIT B (KITS) ×3 IMPLANT
LOCATOR NERVE 3 VOLT (DISPOSABLE) IMPLANT
NEEDLE 27GAX1/2IN MONOJET (NEEDLE) ×6 IMPLANT
NEEDLE PRECISIONGLIDE 27X1.5 (NEEDLE) ×3 IMPLANT
NS IRRIG 1000ML POUR BTL (IV SOLUTION) ×3 IMPLANT
PAD ARMBOARD 7.5X6 YLW CONV (MISCELLANEOUS) ×6 IMPLANT
PENCIL FOOT CONTROL (ELECTRODE) ×3 IMPLANT
SHEARS HARMONIC 9CM CVD (BLADE) ×3 IMPLANT
SPONGE INTESTINAL PEANUT (DISPOSABLE) IMPLANT
STAPLER VISISTAT 35W (STAPLE) ×3 IMPLANT
SUT CHROMIC 3 0 SH 27 (SUTURE) ×6 IMPLANT
SUT CHROMIC 4 0 PS 2 18 (SUTURE) ×3 IMPLANT
SUT ETHILON 3 0 PS 1 (SUTURE) IMPLANT
SUT ETHILON 5 0 P 3 18 (SUTURE)
SUT NYLON ETHILON 5-0 P-3 1X18 (SUTURE) IMPLANT
SUT PLAIN 5 0 P 3 18 (SUTURE) ×9 IMPLANT
SUT SILK 2 0 SH CR/8 (SUTURE) ×6 IMPLANT
SUT SILK 4 0 REEL (SUTURE) ×3 IMPLANT
SUT VIC AB 3-0 SH 18 (SUTURE) ×3 IMPLANT
SYR BULB 3OZ (MISCELLANEOUS) ×3 IMPLANT
SYR CONTROL 10ML LL (SYRINGE) ×3 IMPLANT
TOWEL GREEN STERILE FF (TOWEL DISPOSABLE) ×3 IMPLANT
TRAY ENT MC OR (CUSTOM PROCEDURE TRAY) ×3 IMPLANT
TRAY FOLEY MTR SLVR 14FR STAT (SET/KITS/TRAYS/PACK) ×3 IMPLANT
TUBE CONNECTING 12'X1/4 (SUCTIONS) ×1
TUBE CONNECTING 12X1/4 (SUCTIONS) ×2 IMPLANT

## 2019-08-19 NOTE — Op Note (Signed)
OPERATIVE REPORT  DATE OF SURGERY: 08/19/2019  PATIENT:  Kevin Keel.,  86 y.o. male  PRE-OPERATIVE DIAGNOSIS:  Malignant melanoma of face  POST-OPERATIVE DIAGNOSIS:  Malignant melanoma of face  PROCEDURE:  Procedure(s): LEFT SUPERFICIAL PAROTIDECTOMY, with facial nerve dissection LEFT FACIAL MELANOMA WIDE EXCISION WITH COMPLEX REPAIR LEFT LEVEL THREE SENTINEL LYMPH NODE DISSECTION/BIOPSY SKIN GRAFT FULL THICKNESS, 36 cm  SURGEON:  Beckie Salts, MD  ASSISTANTS: Jolene Provost PA  ANESTHESIA:   General   EBL: 100 ml  DRAINS: 10 French round JP  LOCAL MEDICATIONS USED: 1% Xylocaine with epinephrine in the left chest wall donor site  SPECIMEN: 1.  Left superficial parotidectomy with wide excision of temple skin melanoma, sutures oriented for superior and posterior. 2.  Left level 3 sentinel node dissection  COUNTS:  Correct  PROCEDURE DETAILS: The patient was taken to the operating room and placed on the operating table in the supine position. Following induction of general endotracheal anesthesia, the neck and face were prepped and draped in a standard fashion.  The lesion was identified in the left temporal area.  A 2 cm margins were outlined with a marking pen surrounding this all the way back to the preauricular skin.  This was then continued down along the standard parotidectomy incision around the lobule and down into the neck.  In the left anterior upper chest wall the skin graft donor site was outlined with a marking pen in an elliptical fashion and local anesthetic was infiltrated.  1.  Left superficial parotidectomy.  The skin incisions were created in the neck face and around the melanoma.  Electrocautery was used to incise the skin and subcutaneous tissue.  The great auricular nerve was identified and the posterior branch was preserved all the way down to the origin at Erb's point.  The parotid was then dissected off of the upper sternocleidomastoid muscle and the  digastric muscle.  The main trunk of the facial nerve was identified and a Production manager was used to carefully dissected out towards the pes anserinus.  The lower division was dissected initially followed by the upper division.  The harmonic dissector was used to divide the parotid tissue.  Larger caliber vessels were ligated with 4-0 silk ties.  At the upper division the temporal and zygomatic branches were underneath the melanoma resection and dissection was completed in order to preserve these 2 branches.  The parotidectomy specimen was kept en bloc with the skin mass.  2.  Wide resection of left facial/temporal skin melanoma.  Once the facial nerve was dissected and preserved the remaining skin incisions were completed to remove the entire melanoma.  The entire specimen was approximately 5 cm in diameter.  Sutures were used for orientation purposes.  The entire specimen was sent for pathologic evaluation.  3.  Left level 3 sentinel node dissection.  At the beginning of procedure 2 cc of methylene blue were infiltrated into the 4 quadrants around surrounding the melanoma.  Level 3 jugular chain was exposed and all of the fibrofatty lymph node bearing tissue was dissected off of the jugular anteriorly and posteriorly.  There were several small lymph nodes present within the specimen that seem to contain methylene blue.  This massive tissue was sent for pathologic evaluation and marked as sentinel node level 3.  Hemostasis was completed throughout the wound.  The neck and lower part of the parotidectomy incision were reapproximated using interrupted 3-0 chromic suture.  The 10 French drain was exited through  a separate incision and secured in place with silk suture.  4.  Full-thickness skin graft repair of left facial defect.  The full-thickness skin graft was harvested with a #10 scalpel from the previously marked and infiltrated incision in the left upper anterior chest.  An additional piece was  harvested as well to complete the skin graft.  The skin graft was pie hold with a 15 scalpel.  The graft was secured in position using interrupted 5-0 plain gut.  A bolster was created using 2-0 silk ties surrounding the defect Xeroform Merocel packs and 4 x 4's.  Bolster was placed.  Dermabond was used on the neck and lower facial skin.  Skin staples were used on the donor site after interrupted 3-0 Vicryl sutures.  Drain was charged.  Patient was awakened extubated and transferred to recovery in stable condition.   PATIENT DISPOSITION:  To PACU, stable

## 2019-08-19 NOTE — Interval H&P Note (Signed)
History and Physical Interval Note:  08/19/2019 7:16 AM  Kevin Lynch.  has presented today for surgery, with the diagnosis of Malignant melanoma of face.  The various methods of treatment have been discussed with the patient and family. After consideration of risks, benefits and other options for treatment, the patient has consented to  Procedure(s): PAROTIDECTOMY (Left) LESION EXCISION WITH COMPLEX REPAIR (Left) LYMPH NODE BIOPSY/sentinel node biopsy (Left) SKIN GRAFT FULL THICKNESS or split thickness. TBD at surgery (N/A) as a surgical intervention.  The patient's history has been reviewed, patient examined, no change in status, stable for surgery.  I have reviewed the patient's chart and labs.  Questions were answered to the patient's satisfaction.     Izora Gala

## 2019-08-19 NOTE — Progress Notes (Signed)
Patient ID: Kevin Keel., male   DOB: June 25, 1932, 86 y.o.   MRN: [id number]  Postop check  Awake and alert, no complaints.  Facial nerve is weak on the left side but functional in all branches.  Skin graft bolster is in place.  Donor site is intact.  Surgical incision is clean dry and intact.  There is no hematoma.  JP bulb is not holding a seal.  We will get this on wall suction.  Remove Foley as well.  Continue care.

## 2019-08-19 NOTE — Anesthesia Procedure Notes (Signed)
Procedure Name: Intubation Date/Time: 08/19/2019 7:42 AM Performed by: Barrington Ellison, CRNA Pre-anesthesia Checklist: Patient identified, Emergency Drugs available, Suction available and Patient being monitored Patient Re-evaluated:Patient Re-evaluated prior to induction Oxygen Delivery Method: Circle System Utilized Preoxygenation: Pre-oxygenation with 100% oxygen Induction Type: IV induction Ventilation: Mask ventilation without difficulty Laryngoscope Size: Mac and 3 Grade View: Grade I Tube type: Oral Tube size: 7.5 mm Number of attempts: 1 Airway Equipment and Method: Stylet and Oral airway Placement Confirmation: ETT inserted through vocal cords under direct vision,  positive ETCO2 and breath sounds checked- equal and bilateral Secured at: 22 cm Tube secured with: Tape Dental Injury: Teeth and Oropharynx as per pre-operative assessment

## 2019-08-19 NOTE — Transfer of Care (Signed)
Immediate Anesthesia Transfer of Care Note  Patient: Kevin Lynch.  Procedure(s) Performed: LEFT PAROTIDECTOMY (Left Face) LEFT FACIAL MELANOMA EXCISION WITH COMPLEX REPAIR (Left Face) LEFT LEVEL THREE SENTINEL LYMPH NODE DISSECTION/BIOPSY (Left Face) SKIN GRAFT FULL THICKNESS (Left Face)  Patient Location: PACU  Anesthesia Type:General  Level of Consciousness: lethargic and responds to stimulation  Airway & Oxygen Therapy: Patient Spontanous Breathing and Patient connected to face mask oxygen  Post-op Assessment: Report given to RN  Post vital signs: Reviewed and stable  Last Vitals:  Vitals Value Taken Time  BP 106/47 08/19/19 1118  Temp    Pulse 69 08/19/19 1119  Resp 8 08/19/19 1119  SpO2 94 % 08/19/19 1119  Vitals shown include unvalidated device data.  Last Pain:  Vitals:   08/19/19 0603  TempSrc:   PainSc: 0-No pain         Complications: No apparent anesthesia complications

## 2019-08-19 NOTE — Progress Notes (Signed)
PHARMACIST - PHYSICIAN ORDER COMMUNICATION  CONCERNING: P&T Medication Policy on Herbal Medications  DESCRIPTION:  This patient's order for:  Coenzyme Q 10  has been noted.  This product(s) is classified as an "herbal" or natural product. Due to a lack of definitive safety studies or FDA approval, nonstandard manufacturing practices, plus the potential risk of unknown drug-drug interactions while on inpatient medications, the Pharmacy and Therapeutics Committee does not permit the use of "herbal" or natural products of this type within [facility].   ACTION TAKEN: The pharmacy department is unable to verify this order at this time and your patient has been informed of this safety policy. Please reevaluate patient's clinical condition at discharge and address if the herbal or natural product(s) should be resumed at that time.  Gillermina Hu, PharmD, BCPS, [facility] Clinical Pharmacist

## 2019-08-19 NOTE — Plan of Care (Signed)
  Problem: Activity: Goal: Risk for activity intolerance will decrease Outcome: Progressing  Patient up to bsc w/o c/o SOB.  Problem: Nutrition: Goal: Adequate nutrition will be maintained Outcome: Progressing  Patient tolerating dinner., denies nausea.  Problem: Pain Managment: Goal: General experience of comfort will improve Outcome: Progressing   Patient denies pain at this time.

## 2019-08-20 MED ORDER — PROMETHAZINE HCL 25 MG RE SUPP: 25.0000 mg | Freq: Four times a day (QID) | RECTAL | 1 refills | Status: DC | PRN

## 2019-08-20 MED ORDER — HYDROCODONE-ACETAMINOPHEN 7.5-325 MG PO TABS: 1.0000 | ORAL_TABLET | Freq: Four times a day (QID) | ORAL | 0 refills | Status: DC | PRN

## 2019-08-20 NOTE — Progress Notes (Signed)
Arrived to room, pt and RN state he may go home this morning. Joaquim Lai, RN will re-enter consult if needed.

## 2019-08-20 NOTE — Discharge Instructions (Signed)
Resume Plavix on Sunday.  Try to keep the dressing over the skin graft dry.  The incision in the upper and middle neck is covered in glue so no care is required.  That can get wet and you can use soap on that.  The staples in the upper chest area will stay in for about 1 or 2 weeks.  Apply antibiotic ointment twice daily.  Keep dry most of the time.  Okay to get wet a little bit.  Be careful with the towel not to get caught on the staples.  Use the prescription pain medicine if necessary.  Use the antinausea medicine also only if necessary.  You do not have to fill either of the prescriptions right away if you do not think you will need them.

## 2019-08-20 NOTE — Discharge Summary (Signed)
Physician Discharge Summary  Patient ID: Kevin Lynch. MRN: [id number] DOB/AGE: Sep 23, 1932 86 y.o.  Admit date: 08/19/2019 Discharge date: 08/20/2019  Admission Diagnoses: Melanoma  Discharge Diagnoses:  Active Problems:   Melanoma ([place])   Discharged Condition: good  Hospital Course: No complications  Consults: none  Significant Diagnostic Studies: none  Treatments: surgery: Melanoma excision with parotidectomy, skin graft, sentinel node biopsy  Discharge Exam: Blood pressure 136/64, pulse (!) 55, temperature 97.8 F (36.6 C), temperature source Oral, resp. rate 18, height 5\' 3"  (1.6 m), weight 81.1 kg, SpO2 93 %. PHYSICAL EXAM: Awake and alert in no distress.  Incision lines are all intact.  Drain removed.  Bolster in place on the skin graft.  Facial nerve with slight weakness but the eye closes well.  Disposition: Discharge disposition: 01-Home or Self Care       Discharge Instructions    Diet - low sodium heart healthy   Complete by: As directed    Increase activity slowly   Complete by: As directed      Allergies as of 08/20/2019      Reactions   Shellfish Allergy Hives, Shortness Of Breath   Codeine Nausea Only   Lisinopril    Cough memory difficulty       Medication List    STOP taking these medications   clopidogrel 75 MG tablet Commonly known as: PLAVIX     TAKE these medications   albuterol (2.5 MG/3ML) 0.083% nebulizer solution Commonly known as: PROVENTIL Take 2.5 mg by nebulization every 6 (six) hours as needed for wheezing or shortness of breath.   Breo Ellipta 100-25 MCG/INH Aepb Generic drug: fluticasone furoate-vilanterol Inhale 1 puff into the lungs daily.   CoQ-10 100 MG Caps Take 100 mg by mouth daily.   cyanocobalamin 1000 MCG/ML injection Commonly known as: (VITAMIN B-12) Inject 1,000 mcg into the muscle every 30 (thirty) days.   donepezil 10 MG tablet Commonly known as: ARICEPT Take 10 mg by mouth at bedtime.    escitalopram 20 MG tablet Commonly known as: LEXAPRO Take 20 mg by mouth daily.   HYDROcodone-acetaminophen 7.5-325 MG tablet Commonly known as: Norco Take 1 tablet by mouth every 6 (six) hours as needed for moderate pain.   IMODIUM PO Take 2 mg by mouth daily as needed (upset stomach).   levocetirizine 5 MG tablet Commonly known as: XYZAL Take 5 mg by mouth every morning.   memantine 10 MG tablet Commonly known as: NAMENDA Take 10 mg by mouth 2 (two) times daily.   metoprolol succinate 25 MG 24 hr tablet Commonly known as: TOPROL-XL Take 25 mg by mouth daily.   promethazine 25 MG suppository Commonly known as: PHENERGAN Place 1 suppository (25 mg total) rectally every 6 (six) hours as needed for nausea or vomiting.   spironolactone 25 MG tablet Commonly known as: ALDACTONE Take 25 mg by mouth daily.   terazosin 5 MG capsule Commonly known as: HYTRIN Take 5 mg by mouth at bedtime.   valsartan 80 MG tablet Commonly known as: DIOVAN Take 80 mg by mouth daily.        Signed: Izora Gala 08/20/2019, 9:13 AM

## 2019-08-20 NOTE — Plan of Care (Signed)
  Problem: Education: Goal: Knowledge of General Education information will improve Description: Including pain rating scale, medication(s)/side effects and non-pharmacologic comfort measures Outcome: Completed/Met   Problem: Education: Goal: Knowledge of General Education information will improve Description: Including pain rating scale, medication(s)/side effects and non-pharmacologic comfort measures Outcome: Completed/Met   Problem: Health Behavior/Discharge Planning: Goal: Ability to manage health-related needs will improve Outcome: Completed/Met   Problem: Clinical Measurements: Goal: Ability to maintain clinical measurements within normal limits will improve Outcome: Completed/Met Goal: Will remain free from infection Outcome: Completed/Met Goal: Diagnostic test results will improve Outcome: Completed/Met Goal: Respiratory complications will improve Outcome: Completed/Met Goal: Cardiovascular complication will be avoided Outcome: Completed/Met   Problem: Activity: Goal: Risk for activity intolerance will decrease Outcome: Completed/Met   Problem: Nutrition: Goal: Adequate nutrition will be maintained Outcome: Completed/Met   Problem: Coping: Goal: Level of anxiety will decrease Outcome: Completed/Met   Problem: Elimination: Goal: Will not experience complications related to bowel motility Outcome: Completed/Met Goal: Will not experience complications related to urinary retention Outcome: Completed/Met   Problem: Pain Managment: Goal: General experience of comfort will improve Outcome: Completed/Met   Problem: Safety: Goal: Ability to remain free from injury will improve Outcome: Completed/Met   Problem: Skin Integrity: Goal: Risk for impaired skin integrity will decrease Outcome: Completed/Met

## 2019-08-20 NOTE — Progress Notes (Signed)
[name]. to be D/C'd  per MD order. Discussed with the patient and all questions fully answered.  VSS, Skin clean, dry and intact without evidence of skin break down, no evidence of skin tears noted.  IV catheter discontinued intact. Site without signs and symptoms of complications. Dressing and pressure applied.  An After Visit Summary was printed and given to the patient. Patient received prescription.  D/c education completed with patient/family including follow up instructions, medication list, d/c activities limitations if indicated, with other d/c instructions as indicated by MD - patient able to verbalize understanding, all questions fully answered.   Patient instructed to return to ED, call 911, or call MD for any changes in condition.   Patient to be escorted via [place], and D/C home via private auto.

## 2019-08-25 LAB — SURGICAL PATHOLOGY

## 2019-08-25 NOTE — Anesthesia Postprocedure Evaluation (Signed)
Anesthesia Post Note  Patient: Kevin Lynch.  Procedure(s) Performed: LEFT PAROTIDECTOMY (Left Face) LEFT FACIAL MELANOMA EXCISION WITH COMPLEX REPAIR (Left Face) LEFT LEVEL THREE SENTINEL LYMPH NODE DISSECTION/BIOPSY (Left Face) SKIN GRAFT FULL THICKNESS (Left Face)     Patient location during evaluation: PACU Anesthesia Type: General Level of consciousness: awake and alert Pain management: pain level controlled Vital Signs Assessment: post-procedure vital signs reviewed and stable Respiratory status: spontaneous breathing, nonlabored ventilation, respiratory function stable and patient connected to nasal cannula oxygen Cardiovascular status: blood pressure returned to baseline and stable Postop Assessment: no apparent nausea or vomiting Anesthetic complications: no    Last Vitals:  Vitals:   08/20/19 0209 08/20/19 0413  BP: (!) 120/57 136/64  Pulse: (!) 58 (!) 55  Resp: 16 18  Temp: 36.8 C 36.6 C  SpO2: 94% 93%    Last Pain:  Vitals:   08/20/19 0824  TempSrc:   PainSc: 2                  Kevin Lynch

## 2019-08-26 ENCOUNTER — Ambulatory Visit: Payer: Medicare Other | Admitting: Plastic Surgery

## 2019-08-26 DIAGNOSIS — C4339 Malignant melanoma of other parts of face: Secondary | ICD-10-CM | POA: Diagnosis not present

## 2019-08-26 DIAGNOSIS — C44311 Basal cell carcinoma of skin of nose: Secondary | ICD-10-CM | POA: Diagnosis not present

## 2019-08-27 DIAGNOSIS — I440 Atrioventricular block, first degree: Secondary | ICD-10-CM | POA: Diagnosis not present

## 2019-08-27 DIAGNOSIS — I130 Hypertensive heart and chronic kidney disease with heart failure and stage 1 through stage 4 chronic kidney disease, or unspecified chronic kidney disease: Secondary | ICD-10-CM | POA: Diagnosis not present

## 2019-08-27 DIAGNOSIS — R079 Chest pain, unspecified: Secondary | ICD-10-CM | POA: Diagnosis not present

## 2019-08-27 DIAGNOSIS — T86821 Skin graft (allograft) (autograft) failure: Secondary | ICD-10-CM | POA: Diagnosis not present

## 2019-08-27 DIAGNOSIS — R001 Bradycardia, unspecified: Secondary | ICD-10-CM | POA: Diagnosis not present

## 2019-08-27 DIAGNOSIS — K219 Gastro-esophageal reflux disease without esophagitis: Secondary | ICD-10-CM | POA: Diagnosis not present

## 2019-08-27 DIAGNOSIS — S299XXA Unspecified injury of thorax, initial encounter: Secondary | ICD-10-CM | POA: Diagnosis not present

## 2019-08-27 DIAGNOSIS — R531 Weakness: Secondary | ICD-10-CM | POA: Diagnosis not present

## 2019-08-27 DIAGNOSIS — S3991XA Unspecified injury of abdomen, initial encounter: Secondary | ICD-10-CM | POA: Diagnosis not present

## 2019-08-27 DIAGNOSIS — Z79899 Other long term (current) drug therapy: Secondary | ICD-10-CM | POA: Diagnosis not present

## 2019-08-27 DIAGNOSIS — R410 Disorientation, unspecified: Secondary | ICD-10-CM | POA: Diagnosis not present

## 2019-08-27 DIAGNOSIS — C4330 Malignant melanoma of unspecified part of face: Secondary | ICD-10-CM | POA: Diagnosis not present

## 2019-08-27 DIAGNOSIS — S2241XA Multiple fractures of ribs, right side, initial encounter for closed fracture: Secondary | ICD-10-CM | POA: Diagnosis not present

## 2019-08-27 DIAGNOSIS — R7303 Prediabetes: Secondary | ICD-10-CM | POA: Diagnosis not present

## 2019-08-27 DIAGNOSIS — W010XXA Fall on same level from slipping, tripping and stumbling without subsequent striking against object, initial encounter: Secondary | ICD-10-CM | POA: Diagnosis not present

## 2019-08-27 DIAGNOSIS — Z751 Person awaiting admission to adequate facility elsewhere: Secondary | ICD-10-CM | POA: Diagnosis not present

## 2019-08-27 DIAGNOSIS — S199XXA Unspecified injury of neck, initial encounter: Secondary | ICD-10-CM | POA: Diagnosis not present

## 2019-08-27 DIAGNOSIS — S2241XD Multiple fractures of ribs, right side, subsequent encounter for fracture with routine healing: Secondary | ICD-10-CM | POA: Diagnosis not present

## 2019-08-27 DIAGNOSIS — E538 Deficiency of other specified B group vitamins: Secondary | ICD-10-CM | POA: Diagnosis not present

## 2019-08-27 DIAGNOSIS — K2100 Gastro-esophageal reflux disease with esophagitis, without bleeding: Secondary | ICD-10-CM | POA: Diagnosis not present

## 2019-08-27 DIAGNOSIS — R002 Palpitations: Secondary | ICD-10-CM | POA: Diagnosis not present

## 2019-08-27 DIAGNOSIS — N183 Chronic kidney disease, stage 3 unspecified: Secondary | ICD-10-CM | POA: Diagnosis not present

## 2019-08-27 DIAGNOSIS — R109 Unspecified abdominal pain: Secondary | ICD-10-CM | POA: Diagnosis not present

## 2019-08-27 DIAGNOSIS — G8929 Other chronic pain: Secondary | ICD-10-CM | POA: Diagnosis not present

## 2019-08-27 DIAGNOSIS — I10 Essential (primary) hypertension: Secondary | ICD-10-CM | POA: Diagnosis not present

## 2019-08-27 DIAGNOSIS — R0682 Tachypnea, not elsewhere classified: Secondary | ICD-10-CM | POA: Diagnosis not present

## 2019-08-27 DIAGNOSIS — J449 Chronic obstructive pulmonary disease, unspecified: Secondary | ICD-10-CM | POA: Diagnosis not present

## 2019-08-27 DIAGNOSIS — Z8673 Personal history of transient ischemic attack (TIA), and cerebral infarction without residual deficits: Secondary | ICD-10-CM | POA: Diagnosis not present

## 2019-08-27 DIAGNOSIS — I5032 Chronic diastolic (congestive) heart failure: Secondary | ICD-10-CM | POA: Diagnosis not present

## 2019-08-27 DIAGNOSIS — R279 Unspecified lack of coordination: Secondary | ICD-10-CM | POA: Diagnosis not present

## 2019-08-27 DIAGNOSIS — Z7902 Long term (current) use of antithrombotics/antiplatelets: Secondary | ICD-10-CM | POA: Diagnosis not present

## 2019-08-27 DIAGNOSIS — Z87891 Personal history of nicotine dependence: Secondary | ICD-10-CM | POA: Diagnosis not present

## 2019-08-27 DIAGNOSIS — Z743 Need for continuous supervision: Secondary | ICD-10-CM | POA: Diagnosis not present

## 2019-08-27 DIAGNOSIS — E559 Vitamin D deficiency, unspecified: Secondary | ICD-10-CM | POA: Diagnosis not present

## 2019-08-27 DIAGNOSIS — G459 Transient cerebral ischemic attack, unspecified: Secondary | ICD-10-CM | POA: Diagnosis not present

## 2019-08-27 DIAGNOSIS — D649 Anemia, unspecified: Secondary | ICD-10-CM | POA: Diagnosis not present

## 2019-08-27 DIAGNOSIS — S2231XA Fracture of one rib, right side, initial encounter for closed fracture: Secondary | ICD-10-CM | POA: Diagnosis not present

## 2019-08-27 DIAGNOSIS — I4510 Unspecified right bundle-branch block: Secondary | ICD-10-CM | POA: Diagnosis not present

## 2019-08-27 DIAGNOSIS — I110 Hypertensive heart disease with heart failure: Secondary | ICD-10-CM | POA: Diagnosis not present

## 2019-08-27 DIAGNOSIS — R638 Other symptoms and signs concerning food and fluid intake: Secondary | ICD-10-CM | POA: Diagnosis not present

## 2019-08-27 DIAGNOSIS — R918 Other nonspecific abnormal finding of lung field: Secondary | ICD-10-CM | POA: Diagnosis not present

## 2019-08-27 DIAGNOSIS — Z20822 Contact with and (suspected) exposure to covid-19: Secondary | ICD-10-CM | POA: Diagnosis not present

## 2019-08-27 DIAGNOSIS — S0990XA Unspecified injury of head, initial encounter: Secondary | ICD-10-CM | POA: Diagnosis not present

## 2019-08-27 DIAGNOSIS — R42 Dizziness and giddiness: Secondary | ICD-10-CM | POA: Diagnosis not present

## 2019-08-27 DIAGNOSIS — G8911 Acute pain due to trauma: Secondary | ICD-10-CM | POA: Diagnosis not present

## 2019-08-27 DIAGNOSIS — R2681 Unsteadiness on feet: Secondary | ICD-10-CM | POA: Diagnosis not present

## 2019-08-27 DIAGNOSIS — I714 Abdominal aortic aneurysm, without rupture: Secondary | ICD-10-CM | POA: Diagnosis not present

## 2019-09-03 DIAGNOSIS — S2241XD Multiple fractures of ribs, right side, subsequent encounter for fracture with routine healing: Secondary | ICD-10-CM | POA: Diagnosis not present

## 2019-09-03 DIAGNOSIS — G459 Transient cerebral ischemic attack, unspecified: Secondary | ICD-10-CM | POA: Diagnosis not present

## 2019-09-03 DIAGNOSIS — S2239XA Fracture of one rib, unspecified side, initial encounter for closed fracture: Secondary | ICD-10-CM | POA: Diagnosis not present

## 2019-09-03 DIAGNOSIS — E538 Deficiency of other specified B group vitamins: Secondary | ICD-10-CM | POA: Diagnosis not present

## 2019-09-03 DIAGNOSIS — Z743 Need for continuous supervision: Secondary | ICD-10-CM | POA: Diagnosis not present

## 2019-09-03 DIAGNOSIS — C4330 Malignant melanoma of unspecified part of face: Secondary | ICD-10-CM | POA: Diagnosis not present

## 2019-09-03 DIAGNOSIS — R279 Unspecified lack of coordination: Secondary | ICD-10-CM | POA: Diagnosis not present

## 2019-09-03 DIAGNOSIS — J449 Chronic obstructive pulmonary disease, unspecified: Secondary | ICD-10-CM | POA: Diagnosis not present

## 2019-09-03 DIAGNOSIS — R7303 Prediabetes: Secondary | ICD-10-CM | POA: Diagnosis not present

## 2019-09-03 DIAGNOSIS — S2241XA Multiple fractures of ribs, right side, initial encounter for closed fracture: Secondary | ICD-10-CM | POA: Diagnosis not present

## 2019-09-03 DIAGNOSIS — K219 Gastro-esophageal reflux disease without esophagitis: Secondary | ICD-10-CM | POA: Diagnosis not present

## 2019-09-03 DIAGNOSIS — R410 Disorientation, unspecified: Secondary | ICD-10-CM | POA: Diagnosis not present

## 2019-09-03 DIAGNOSIS — R269 Unspecified abnormalities of gait and mobility: Secondary | ICD-10-CM | POA: Diagnosis not present

## 2019-09-03 DIAGNOSIS — I10 Essential (primary) hypertension: Secondary | ICD-10-CM | POA: Diagnosis not present

## 2019-09-03 DIAGNOSIS — R531 Weakness: Secondary | ICD-10-CM | POA: Diagnosis not present

## 2019-09-04 DIAGNOSIS — R269 Unspecified abnormalities of gait and mobility: Secondary | ICD-10-CM | POA: Diagnosis not present

## 2019-09-04 DIAGNOSIS — S2239XA Fracture of one rib, unspecified side, initial encounter for closed fracture: Secondary | ICD-10-CM | POA: Diagnosis not present

## 2019-09-17 DIAGNOSIS — Z7689 Persons encountering health services in other specified circumstances: Secondary | ICD-10-CM | POA: Diagnosis not present

## 2019-09-17 DIAGNOSIS — W19XXXA Unspecified fall, initial encounter: Secondary | ICD-10-CM | POA: Diagnosis not present

## 2019-09-17 DIAGNOSIS — C439 Malignant melanoma of skin, unspecified: Secondary | ICD-10-CM | POA: Diagnosis not present

## 2019-09-17 DIAGNOSIS — S2239XA Fracture of one rib, unspecified side, initial encounter for closed fracture: Secondary | ICD-10-CM | POA: Diagnosis not present

## 2019-09-19 DIAGNOSIS — S2241XD Multiple fractures of ribs, right side, subsequent encounter for fracture with routine healing: Secondary | ICD-10-CM | POA: Diagnosis not present

## 2019-09-19 DIAGNOSIS — C4339 Malignant melanoma of other parts of face: Secondary | ICD-10-CM | POA: Diagnosis not present

## 2019-09-19 DIAGNOSIS — W1809XD Striking against other object with subsequent fall, subsequent encounter: Secondary | ICD-10-CM | POA: Diagnosis not present

## 2019-09-19 DIAGNOSIS — Z79891 Long term (current) use of opiate analgesic: Secondary | ICD-10-CM | POA: Diagnosis not present

## 2019-09-19 DIAGNOSIS — I700 Atherosclerosis of aorta: Secondary | ICD-10-CM | POA: Diagnosis not present

## 2019-09-19 DIAGNOSIS — Z483 Aftercare following surgery for neoplasm: Secondary | ICD-10-CM | POA: Diagnosis not present

## 2019-09-19 DIAGNOSIS — S32030D Wedge compression fracture of third lumbar vertebra, subsequent encounter for fracture with routine healing: Secondary | ICD-10-CM | POA: Diagnosis not present

## 2019-09-21 DIAGNOSIS — S2241XD Multiple fractures of ribs, right side, subsequent encounter for fracture with routine healing: Secondary | ICD-10-CM | POA: Diagnosis not present

## 2019-09-21 DIAGNOSIS — I700 Atherosclerosis of aorta: Secondary | ICD-10-CM | POA: Diagnosis not present

## 2019-09-21 DIAGNOSIS — Z483 Aftercare following surgery for neoplasm: Secondary | ICD-10-CM | POA: Diagnosis not present

## 2019-09-21 DIAGNOSIS — C4339 Malignant melanoma of other parts of face: Secondary | ICD-10-CM | POA: Diagnosis not present

## 2019-09-21 DIAGNOSIS — Z79891 Long term (current) use of opiate analgesic: Secondary | ICD-10-CM | POA: Diagnosis not present

## 2019-09-21 DIAGNOSIS — S32030D Wedge compression fracture of third lumbar vertebra, subsequent encounter for fracture with routine healing: Secondary | ICD-10-CM | POA: Diagnosis not present

## 2019-09-21 DIAGNOSIS — W1809XD Striking against other object with subsequent fall, subsequent encounter: Secondary | ICD-10-CM | POA: Diagnosis not present

## 2019-09-23 DIAGNOSIS — Z483 Aftercare following surgery for neoplasm: Secondary | ICD-10-CM | POA: Diagnosis not present

## 2019-09-23 DIAGNOSIS — I700 Atherosclerosis of aorta: Secondary | ICD-10-CM | POA: Diagnosis not present

## 2019-09-23 DIAGNOSIS — S32030D Wedge compression fracture of third lumbar vertebra, subsequent encounter for fracture with routine healing: Secondary | ICD-10-CM | POA: Diagnosis not present

## 2019-09-23 DIAGNOSIS — Z79891 Long term (current) use of opiate analgesic: Secondary | ICD-10-CM | POA: Diagnosis not present

## 2019-09-23 DIAGNOSIS — S2241XD Multiple fractures of ribs, right side, subsequent encounter for fracture with routine healing: Secondary | ICD-10-CM | POA: Diagnosis not present

## 2019-09-23 DIAGNOSIS — C4339 Malignant melanoma of other parts of face: Secondary | ICD-10-CM | POA: Diagnosis not present

## 2019-09-23 DIAGNOSIS — W1809XD Striking against other object with subsequent fall, subsequent encounter: Secondary | ICD-10-CM | POA: Diagnosis not present

## 2019-09-26 DIAGNOSIS — Z483 Aftercare following surgery for neoplasm: Secondary | ICD-10-CM | POA: Diagnosis not present

## 2019-09-26 DIAGNOSIS — S32030D Wedge compression fracture of third lumbar vertebra, subsequent encounter for fracture with routine healing: Secondary | ICD-10-CM | POA: Diagnosis not present

## 2019-09-26 DIAGNOSIS — Z79891 Long term (current) use of opiate analgesic: Secondary | ICD-10-CM | POA: Diagnosis not present

## 2019-09-26 DIAGNOSIS — S2241XD Multiple fractures of ribs, right side, subsequent encounter for fracture with routine healing: Secondary | ICD-10-CM | POA: Diagnosis not present

## 2019-09-26 DIAGNOSIS — W1809XD Striking against other object with subsequent fall, subsequent encounter: Secondary | ICD-10-CM | POA: Diagnosis not present

## 2019-09-26 DIAGNOSIS — I700 Atherosclerosis of aorta: Secondary | ICD-10-CM | POA: Diagnosis not present

## 2019-09-26 DIAGNOSIS — C4339 Malignant melanoma of other parts of face: Secondary | ICD-10-CM | POA: Diagnosis not present

## 2019-09-30 DIAGNOSIS — Z79891 Long term (current) use of opiate analgesic: Secondary | ICD-10-CM | POA: Diagnosis not present

## 2019-09-30 DIAGNOSIS — I700 Atherosclerosis of aorta: Secondary | ICD-10-CM | POA: Diagnosis not present

## 2019-09-30 DIAGNOSIS — S2241XD Multiple fractures of ribs, right side, subsequent encounter for fracture with routine healing: Secondary | ICD-10-CM | POA: Diagnosis not present

## 2019-09-30 DIAGNOSIS — Z483 Aftercare following surgery for neoplasm: Secondary | ICD-10-CM | POA: Diagnosis not present

## 2019-09-30 DIAGNOSIS — W1809XD Striking against other object with subsequent fall, subsequent encounter: Secondary | ICD-10-CM | POA: Diagnosis not present

## 2019-09-30 DIAGNOSIS — C4339 Malignant melanoma of other parts of face: Secondary | ICD-10-CM | POA: Diagnosis not present

## 2019-09-30 DIAGNOSIS — S32030D Wedge compression fracture of third lumbar vertebra, subsequent encounter for fracture with routine healing: Secondary | ICD-10-CM | POA: Diagnosis not present

## 2019-10-02 DIAGNOSIS — S2241XD Multiple fractures of ribs, right side, subsequent encounter for fracture with routine healing: Secondary | ICD-10-CM | POA: Diagnosis not present

## 2019-10-02 DIAGNOSIS — Z79891 Long term (current) use of opiate analgesic: Secondary | ICD-10-CM | POA: Diagnosis not present

## 2019-10-02 DIAGNOSIS — C4339 Malignant melanoma of other parts of face: Secondary | ICD-10-CM | POA: Diagnosis not present

## 2019-10-02 DIAGNOSIS — S32030D Wedge compression fracture of third lumbar vertebra, subsequent encounter for fracture with routine healing: Secondary | ICD-10-CM | POA: Diagnosis not present

## 2019-10-02 DIAGNOSIS — W1809XD Striking against other object with subsequent fall, subsequent encounter: Secondary | ICD-10-CM | POA: Diagnosis not present

## 2019-10-02 DIAGNOSIS — Z483 Aftercare following surgery for neoplasm: Secondary | ICD-10-CM | POA: Diagnosis not present

## 2019-10-02 DIAGNOSIS — I700 Atherosclerosis of aorta: Secondary | ICD-10-CM | POA: Diagnosis not present

## 2019-10-06 DIAGNOSIS — I700 Atherosclerosis of aorta: Secondary | ICD-10-CM | POA: Diagnosis not present

## 2019-10-06 DIAGNOSIS — Z79891 Long term (current) use of opiate analgesic: Secondary | ICD-10-CM | POA: Diagnosis not present

## 2019-10-06 DIAGNOSIS — W1809XD Striking against other object with subsequent fall, subsequent encounter: Secondary | ICD-10-CM | POA: Diagnosis not present

## 2019-10-06 DIAGNOSIS — Z483 Aftercare following surgery for neoplasm: Secondary | ICD-10-CM | POA: Diagnosis not present

## 2019-10-06 DIAGNOSIS — C4339 Malignant melanoma of other parts of face: Secondary | ICD-10-CM | POA: Diagnosis not present

## 2019-10-06 DIAGNOSIS — S2241XD Multiple fractures of ribs, right side, subsequent encounter for fracture with routine healing: Secondary | ICD-10-CM | POA: Diagnosis not present

## 2019-10-06 DIAGNOSIS — S32030D Wedge compression fracture of third lumbar vertebra, subsequent encounter for fracture with routine healing: Secondary | ICD-10-CM | POA: Diagnosis not present

## 2019-10-07 DIAGNOSIS — S32030D Wedge compression fracture of third lumbar vertebra, subsequent encounter for fracture with routine healing: Secondary | ICD-10-CM | POA: Diagnosis not present

## 2019-10-07 DIAGNOSIS — C4339 Malignant melanoma of other parts of face: Secondary | ICD-10-CM | POA: Diagnosis not present

## 2019-10-07 DIAGNOSIS — S2241XD Multiple fractures of ribs, right side, subsequent encounter for fracture with routine healing: Secondary | ICD-10-CM | POA: Diagnosis not present

## 2019-10-07 DIAGNOSIS — Z79891 Long term (current) use of opiate analgesic: Secondary | ICD-10-CM | POA: Diagnosis not present

## 2019-10-07 DIAGNOSIS — I700 Atherosclerosis of aorta: Secondary | ICD-10-CM | POA: Diagnosis not present

## 2019-10-07 DIAGNOSIS — W1809XD Striking against other object with subsequent fall, subsequent encounter: Secondary | ICD-10-CM | POA: Diagnosis not present

## 2019-10-07 DIAGNOSIS — Z483 Aftercare following surgery for neoplasm: Secondary | ICD-10-CM | POA: Diagnosis not present

## 2019-10-08 DIAGNOSIS — Z483 Aftercare following surgery for neoplasm: Secondary | ICD-10-CM | POA: Diagnosis not present

## 2019-10-08 DIAGNOSIS — Z79891 Long term (current) use of opiate analgesic: Secondary | ICD-10-CM | POA: Diagnosis not present

## 2019-10-08 DIAGNOSIS — S2241XD Multiple fractures of ribs, right side, subsequent encounter for fracture with routine healing: Secondary | ICD-10-CM | POA: Diagnosis not present

## 2019-10-08 DIAGNOSIS — S32030D Wedge compression fracture of third lumbar vertebra, subsequent encounter for fracture with routine healing: Secondary | ICD-10-CM | POA: Diagnosis not present

## 2019-10-08 DIAGNOSIS — W1809XD Striking against other object with subsequent fall, subsequent encounter: Secondary | ICD-10-CM | POA: Diagnosis not present

## 2019-10-08 DIAGNOSIS — C4339 Malignant melanoma of other parts of face: Secondary | ICD-10-CM | POA: Diagnosis not present

## 2019-10-08 DIAGNOSIS — I700 Atherosclerosis of aorta: Secondary | ICD-10-CM | POA: Diagnosis not present

## 2019-10-13 DIAGNOSIS — S2241XD Multiple fractures of ribs, right side, subsequent encounter for fracture with routine healing: Secondary | ICD-10-CM | POA: Diagnosis not present

## 2019-10-13 DIAGNOSIS — S32030D Wedge compression fracture of third lumbar vertebra, subsequent encounter for fracture with routine healing: Secondary | ICD-10-CM | POA: Diagnosis not present

## 2019-10-13 DIAGNOSIS — Z79891 Long term (current) use of opiate analgesic: Secondary | ICD-10-CM | POA: Diagnosis not present

## 2019-10-13 DIAGNOSIS — W1809XD Striking against other object with subsequent fall, subsequent encounter: Secondary | ICD-10-CM | POA: Diagnosis not present

## 2019-10-13 DIAGNOSIS — I700 Atherosclerosis of aorta: Secondary | ICD-10-CM | POA: Diagnosis not present

## 2019-10-13 DIAGNOSIS — Z483 Aftercare following surgery for neoplasm: Secondary | ICD-10-CM | POA: Diagnosis not present

## 2019-10-13 DIAGNOSIS — C4339 Malignant melanoma of other parts of face: Secondary | ICD-10-CM | POA: Diagnosis not present

## 2019-10-14 DIAGNOSIS — C4339 Malignant melanoma of other parts of face: Secondary | ICD-10-CM | POA: Diagnosis not present

## 2019-10-14 DIAGNOSIS — I700 Atherosclerosis of aorta: Secondary | ICD-10-CM | POA: Diagnosis not present

## 2019-10-14 DIAGNOSIS — Z483 Aftercare following surgery for neoplasm: Secondary | ICD-10-CM | POA: Diagnosis not present

## 2019-10-14 DIAGNOSIS — S32030D Wedge compression fracture of third lumbar vertebra, subsequent encounter for fracture with routine healing: Secondary | ICD-10-CM | POA: Diagnosis not present

## 2019-10-14 DIAGNOSIS — S2241XD Multiple fractures of ribs, right side, subsequent encounter for fracture with routine healing: Secondary | ICD-10-CM | POA: Diagnosis not present

## 2019-10-14 DIAGNOSIS — Z79891 Long term (current) use of opiate analgesic: Secondary | ICD-10-CM | POA: Diagnosis not present

## 2019-10-14 DIAGNOSIS — W1809XD Striking against other object with subsequent fall, subsequent encounter: Secondary | ICD-10-CM | POA: Diagnosis not present

## 2019-10-20 DIAGNOSIS — Z79891 Long term (current) use of opiate analgesic: Secondary | ICD-10-CM | POA: Diagnosis not present

## 2019-10-20 DIAGNOSIS — C4339 Malignant melanoma of other parts of face: Secondary | ICD-10-CM | POA: Diagnosis not present

## 2019-10-20 DIAGNOSIS — S2241XD Multiple fractures of ribs, right side, subsequent encounter for fracture with routine healing: Secondary | ICD-10-CM | POA: Diagnosis not present

## 2019-10-20 DIAGNOSIS — I700 Atherosclerosis of aorta: Secondary | ICD-10-CM | POA: Diagnosis not present

## 2019-10-20 DIAGNOSIS — Z483 Aftercare following surgery for neoplasm: Secondary | ICD-10-CM | POA: Diagnosis not present

## 2019-10-20 DIAGNOSIS — S32030D Wedge compression fracture of third lumbar vertebra, subsequent encounter for fracture with routine healing: Secondary | ICD-10-CM | POA: Diagnosis not present

## 2019-10-20 DIAGNOSIS — W1809XD Striking against other object with subsequent fall, subsequent encounter: Secondary | ICD-10-CM | POA: Diagnosis not present

## 2019-10-22 DIAGNOSIS — I5030 Unspecified diastolic (congestive) heart failure: Secondary | ICD-10-CM | POA: Diagnosis not present

## 2019-10-22 DIAGNOSIS — Z87440 Personal history of urinary (tract) infections: Secondary | ICD-10-CM | POA: Diagnosis not present

## 2019-10-27 DIAGNOSIS — C4339 Malignant melanoma of other parts of face: Secondary | ICD-10-CM | POA: Diagnosis not present

## 2019-10-27 DIAGNOSIS — Z483 Aftercare following surgery for neoplasm: Secondary | ICD-10-CM | POA: Diagnosis not present

## 2019-10-27 DIAGNOSIS — S2241XD Multiple fractures of ribs, right side, subsequent encounter for fracture with routine healing: Secondary | ICD-10-CM | POA: Diagnosis not present

## 2019-10-27 DIAGNOSIS — I700 Atherosclerosis of aorta: Secondary | ICD-10-CM | POA: Diagnosis not present

## 2019-10-27 DIAGNOSIS — W1809XD Striking against other object with subsequent fall, subsequent encounter: Secondary | ICD-10-CM | POA: Diagnosis not present

## 2019-10-27 DIAGNOSIS — Z79891 Long term (current) use of opiate analgesic: Secondary | ICD-10-CM | POA: Diagnosis not present

## 2019-10-27 DIAGNOSIS — S32030D Wedge compression fracture of third lumbar vertebra, subsequent encounter for fracture with routine healing: Secondary | ICD-10-CM | POA: Diagnosis not present

## 2019-10-28 ENCOUNTER — Ambulatory Visit: Payer: Medicare Other | Admitting: Oncology

## 2019-10-31 ENCOUNTER — Telehealth: Payer: Self-pay | Admitting: Oncology

## 2019-10-31 NOTE — Telephone Encounter (Signed)
Rescheduled 07/06 appointment to 08/04 due to providers pal, patient has been called and voicemail was left.

## 2019-11-04 ENCOUNTER — Encounter: Payer: Self-pay | Admitting: Plastic Surgery

## 2019-11-04 ENCOUNTER — Inpatient Hospital Stay: Payer: Medicare Other | Admitting: Oncology

## 2019-11-05 DIAGNOSIS — W1809XD Striking against other object with subsequent fall, subsequent encounter: Secondary | ICD-10-CM | POA: Diagnosis not present

## 2019-11-05 DIAGNOSIS — S2241XD Multiple fractures of ribs, right side, subsequent encounter for fracture with routine healing: Secondary | ICD-10-CM | POA: Diagnosis not present

## 2019-11-05 DIAGNOSIS — S32030D Wedge compression fracture of third lumbar vertebra, subsequent encounter for fracture with routine healing: Secondary | ICD-10-CM | POA: Diagnosis not present

## 2019-11-05 DIAGNOSIS — Z79891 Long term (current) use of opiate analgesic: Secondary | ICD-10-CM | POA: Diagnosis not present

## 2019-11-05 DIAGNOSIS — Z483 Aftercare following surgery for neoplasm: Secondary | ICD-10-CM | POA: Diagnosis not present

## 2019-11-05 DIAGNOSIS — C4339 Malignant melanoma of other parts of face: Secondary | ICD-10-CM | POA: Diagnosis not present

## 2019-11-05 DIAGNOSIS — I700 Atherosclerosis of aorta: Secondary | ICD-10-CM | POA: Diagnosis not present

## 2019-11-06 ENCOUNTER — Telehealth: Payer: Self-pay | Admitting: Oncology

## 2019-11-06 NOTE — Telephone Encounter (Signed)
Spoke with patient's spouse and patient will be notified of upcoming appointment.

## 2019-11-10 ENCOUNTER — Ambulatory Visit: Payer: Medicare Other | Admitting: Plastic Surgery

## 2019-11-10 ENCOUNTER — Encounter: Payer: Self-pay | Admitting: Plastic Surgery

## 2019-11-10 ENCOUNTER — Other Ambulatory Visit: Payer: Self-pay

## 2019-11-10 DIAGNOSIS — S0180XA Unspecified open wound of other part of head, initial encounter: Secondary | ICD-10-CM | POA: Insufficient documentation

## 2019-11-10 DIAGNOSIS — C44311 Basal cell carcinoma of skin of nose: Secondary | ICD-10-CM | POA: Diagnosis not present

## 2019-11-10 DIAGNOSIS — C4491 Basal cell carcinoma of skin, unspecified: Secondary | ICD-10-CM | POA: Insufficient documentation

## 2019-11-10 HISTORY — DX: Unspecified open wound of other part of head, initial encounter: S01.80XA

## 2019-11-10 NOTE — Progress Notes (Signed)
Patient ID: Kevin Lynch., male    DOB: [birth year], 86 y.o.   MRN: [id number]   Chief Complaint  Patient presents with  . Advice Only    The patient is an 86 year old male here with family for evaluation of his face.  He has had several skin cancers in the past.  He had a melanoma on his left cheek that was treated by Mohs surgery and Dr. Constance Holster.  That area is approximately 5 x 6 cm in size and is open.  He has been doing Vaseline dressing changes on it daily.  He now has a basal cell carcinoma on his right nasal ala.  His nasal ala is currently notched.  He has had several skin cancers removed from his nose in the past.  He is treated with anticoagulation but is able to stop it without bridging prior to procedures.  He has multiple medical conditions and recently has suffered from deconditioning.  After his previous facial surgery he suffered some confusion.   Review of Systems  Constitutional: Positive for activity change.  HENT: Negative.   Eyes: Negative.   Respiratory: Negative.  Negative for chest tightness.   Gastrointestinal: Negative for abdominal pain.  Endocrine: Negative.   Genitourinary: Negative.   Musculoskeletal: Positive for gait problem.  Skin: Positive for color change and wound.  Hematological: Negative.   Psychiatric/Behavioral: Negative.     Past Medical History:  Diagnosis Date  . Actinic keratosis   . Anemia   . Arthritis   . Basal cell carcinoma    skin - chest, back and face  . Cataracts, bilateral    removed by surgery  . COPD (chronic obstructive pulmonary disease) ([place])   . Dementia ([place])   . Depression   . Diastolic heart failure ([place])   . Former smoker    quit 1983-smoked 30 yrs  . Gastric ulcer    from 06/06/15: ULCER/GASTRITIS/ANEMIA:  . HDL lipoprotein deficiency   . History of blood transfusion   . History of stroke   . Hyperglycemia   . Hyperlipidemia    no meds  . Hypertension   . Pneumonia    x 1  . Pre-diabetes    does  not check blood sugar  . Prediabetes   . Restless leg syndrome   . Stroke ([place]) 2012  . Wears glasses     Past Surgical History:  Procedure Laterality Date  . CATARACT EXTRACTION    . COLONOSCOPY    . DRUG INDUCED ENDOSCOPY    . HEMORROIDECTOMY    . LESION EXCISION WITH COMPLEX REPAIR Left 08/19/2019   Procedure: LEFT FACIAL MELANOMA EXCISION WITH COMPLEX REPAIR;  Surgeon: Izora Gala, MD;  Location: [facility];  Service: ENT;  Laterality: Left;  . LYMPH NODE BIOPSY Left 08/19/2019   Procedure: LEFT LEVEL THREE SENTINEL LYMPH NODE DISSECTION/BIOPSY;  Surgeon: Izora Gala, MD;  Location: [facility];  Service: ENT;  Laterality: Left;  Marland Kitchen MELANOMA EXCISION    . NASAL SEPTUM SURGERY    . PAROTIDECTOMY Left 08/19/2019   LEFT SUPERFICIAL PAROTIDECTOMY, with facial nerve dissection  . PAROTIDECTOMY Left 08/19/2019   Procedure: LEFT PAROTIDECTOMY;  Surgeon: Izora Gala, MD;  Location: [facility];  Service: ENT;  Laterality: Left;  . PROSTATE SURGERY    . SENTINEL LYMPH NODE BIOPSY  08/19/2019   LEFT LEVEL THREE SENTINEL LYMPH NODE DISSECTION/BIOPSY  . SKIN FULL THICKNESS GRAFT Left 08/19/2019   Procedure: SKIN GRAFT FULL THICKNESS;  Surgeon: Izora Gala,  MD;  Location: [facility];  Service: ENT;  Laterality: Left;  Marland Kitchen VASECTOMY        Current Outpatient Medications:  .  albuterol (PROVENTIL) (2.5 MG/3ML) 0.083% nebulizer solution, Take 2.5 mg by nebulization every 6 (six) hours as needed for wheezing or shortness of breath., Disp: , Rfl:  .  BREO ELLIPTA 100-25 MCG/INH AEPB, Inhale 1 puff into the lungs daily. , Disp: , Rfl:  .  Coenzyme Q10 (COQ-10) 100 MG CAPS, Take 100 mg by mouth daily. , Disp: , Rfl:  .  cyanocobalamin (,VITAMIN B-12,) 1000 MCG/ML injection, Inject 1,000 mcg into the muscle every 30 (thirty) days. , Disp: , Rfl:  .  donepezil (ARICEPT) 10 MG tablet, Take 10 mg by mouth at bedtime., Disp: , Rfl:  .  ergocalciferol (VITAMIN D2) 1.25 MG (50000 UT) capsule, Take 1 capsule by mouth once a  week., Disp: , Rfl:  .  escitalopram (LEXAPRO) 20 MG tablet, Take 20 mg by mouth daily. , Disp: , Rfl:  .  HYDROcodone-acetaminophen (NORCO) 7.5-325 MG tablet, Take 1 tablet by mouth every 6 (six) hours as needed for moderate pain., Disp: 20 tablet, Rfl: 0 .  levocetirizine (XYZAL) 5 MG tablet, Take 5 mg by mouth every morning., Disp: , Rfl:  .  Loperamide HCl (IMODIUM PO), Take 2 mg by mouth daily as needed (upset stomach). , Disp: , Rfl:  .  memantine (NAMENDA) 10 MG tablet, Take 10 mg by mouth 2 (two) times daily. , Disp: , Rfl:  .  metoprolol succinate (TOPROL-XL) 25 MG 24 hr tablet, Take 25 mg by mouth daily., Disp: , Rfl:  .  promethazine (PHENERGAN) 25 MG suppository, Place 1 suppository (25 mg total) rectally every 6 (six) hours as needed for nausea or vomiting., Disp: 12 suppository, Rfl: 1 .  spironolactone (ALDACTONE) 25 MG tablet, Take 25 mg by mouth daily., Disp: , Rfl:  .  terazosin (HYTRIN) 5 MG capsule, Take 5 mg by mouth at bedtime., Disp: , Rfl:  .  valsartan (DIOVAN) 80 MG tablet, Take 80 mg by mouth daily. , Disp: , Rfl:    Objective:   Vitals:   11/10/19 1057  BP: 112/66  Pulse: (!) 56  Temp: (!) 97.2 F (36.2 C)  SpO2: 98%    Physical Exam Vitals reviewed.  HENT:     Head: Normocephalic.   Cardiovascular:     Rate and Rhythm: Normal rate.     Pulses: Normal pulses.  Pulmonary:     Effort: Pulmonary effort is normal. No respiratory distress.  Abdominal:     General: There is no distension.  Skin:    General: Skin is warm.  Neurological:     Mental Status: He is alert. Mental status is at baseline.  Psychiatric:        Mood and Affect: Mood normal.        Behavior: Behavior normal.     Assessment & Plan:  Basal cell carcinoma (BCC) of skin of nose  We will work with Dr. Winifred Olive to arrange for repair.  I will see him after the nose is complete.  They would like the repair, if possible, to be done awake if at all possible.  They understand that this  may mean a more simplistic repair that aesthetically may not be as good as something that would require general anesthesia. Pictures were obtained of the patient and placed in the chart with the patient's or guardian's permission.  [place], DO

## 2019-11-11 DIAGNOSIS — Z79891 Long term (current) use of opiate analgesic: Secondary | ICD-10-CM | POA: Diagnosis not present

## 2019-11-11 DIAGNOSIS — S2241XD Multiple fractures of ribs, right side, subsequent encounter for fracture with routine healing: Secondary | ICD-10-CM | POA: Diagnosis not present

## 2019-11-11 DIAGNOSIS — W1809XD Striking against other object with subsequent fall, subsequent encounter: Secondary | ICD-10-CM | POA: Diagnosis not present

## 2019-11-11 DIAGNOSIS — C4339 Malignant melanoma of other parts of face: Secondary | ICD-10-CM | POA: Diagnosis not present

## 2019-11-11 DIAGNOSIS — S32030D Wedge compression fracture of third lumbar vertebra, subsequent encounter for fracture with routine healing: Secondary | ICD-10-CM | POA: Diagnosis not present

## 2019-11-11 DIAGNOSIS — I700 Atherosclerosis of aorta: Secondary | ICD-10-CM | POA: Diagnosis not present

## 2019-11-11 DIAGNOSIS — S01401A Unspecified open wound of right cheek and temporomandibular area, initial encounter: Secondary | ICD-10-CM | POA: Diagnosis not present

## 2019-11-11 DIAGNOSIS — Z483 Aftercare following surgery for neoplasm: Secondary | ICD-10-CM | POA: Diagnosis not present

## 2019-11-12 DIAGNOSIS — S2241XD Multiple fractures of ribs, right side, subsequent encounter for fracture with routine healing: Secondary | ICD-10-CM | POA: Diagnosis not present

## 2019-11-12 DIAGNOSIS — W1809XD Striking against other object with subsequent fall, subsequent encounter: Secondary | ICD-10-CM | POA: Diagnosis not present

## 2019-11-12 DIAGNOSIS — Z79891 Long term (current) use of opiate analgesic: Secondary | ICD-10-CM | POA: Diagnosis not present

## 2019-11-12 DIAGNOSIS — S32030D Wedge compression fracture of third lumbar vertebra, subsequent encounter for fracture with routine healing: Secondary | ICD-10-CM | POA: Diagnosis not present

## 2019-11-12 DIAGNOSIS — Z483 Aftercare following surgery for neoplasm: Secondary | ICD-10-CM | POA: Diagnosis not present

## 2019-11-12 DIAGNOSIS — I700 Atherosclerosis of aorta: Secondary | ICD-10-CM | POA: Diagnosis not present

## 2019-11-12 DIAGNOSIS — C4339 Malignant melanoma of other parts of face: Secondary | ICD-10-CM | POA: Diagnosis not present

## 2019-11-14 ENCOUNTER — Other Ambulatory Visit: Payer: Self-pay

## 2019-11-14 ENCOUNTER — Inpatient Hospital Stay: Payer: Medicare Other | Attending: Oncology | Admitting: Oncology

## 2019-11-14 VITALS — BP 135/75 | HR 54 | Temp 97.5°F | Resp 14 | Wt 174.6 lb

## 2019-11-14 DIAGNOSIS — Z79899 Other long term (current) drug therapy: Secondary | ICD-10-CM | POA: Insufficient documentation

## 2019-11-14 DIAGNOSIS — Z85828 Personal history of other malignant neoplasm of skin: Secondary | ICD-10-CM | POA: Diagnosis not present

## 2019-11-14 DIAGNOSIS — Z85820 Personal history of malignant melanoma of skin: Secondary | ICD-10-CM | POA: Diagnosis not present

## 2019-11-14 DIAGNOSIS — C439 Malignant melanoma of skin, unspecified: Secondary | ICD-10-CM | POA: Diagnosis not present

## 2019-11-14 DIAGNOSIS — Z7951 Long term (current) use of inhaled steroids: Secondary | ICD-10-CM | POA: Diagnosis not present

## 2019-11-14 NOTE — Progress Notes (Signed)
Hematology and Oncology Follow Up Visit  Kevin Lynch [id number] [birth year] 86 y.o. 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  He was found to have T4BN0 tumor.   Prior Therapy: He is status post wide excision and sentinel lymph node sampling as well as skin graft performed by Dr. Constance Holster completed on August 19, 2019.  The final pathological stage indicate T4BN0 with 0 out of 9 lymph nodes involved.  Nodular melanoma subtype with negative margins.  Ulceration was noted on the prior biopsy.   Current therapy: Active surveillance.  Interim History: Kevin Lynch returns today for a follow-up visit.  Since last visit, he underwent surgical resection and a skin graft which she has tolerated reasonably well.  His wound is healing well without any complications.  He does report some slight increased dementia according to his wife although overall manageable.  He denies any recent hospitalization or illnesses.  He denies any changes in his performance status or activity level.  He does ambulate short distances but rather slowly.     Medications: I have reviewed the patient's current medications.  Current Outpatient Medications  Medication Sig Dispense Refill  . albuterol (PROVENTIL) (2.5 MG/3ML) 0.083% nebulizer solution Take 2.5 mg by nebulization every 6 (six) hours as needed for wheezing or shortness of breath.    Marland Kitchen BREO ELLIPTA 100-25 MCG/INH AEPB Inhale 1 puff into the lungs daily.     . Coenzyme Q10 (COQ-10) 100 MG CAPS Take 100 mg by mouth daily.     . cyanocobalamin (,VITAMIN B-12,) 1000 MCG/ML injection Inject 1,000 mcg into the muscle every 30 (thirty) days.     Marland Kitchen donepezil (ARICEPT) 10 MG tablet Take 10 mg by mouth at bedtime.    . ergocalciferol (VITAMIN D2) 1.25 MG (50000 UT) capsule Take 1 capsule by mouth once a week.    . escitalopram (LEXAPRO) 20 MG tablet Take  20 mg by mouth daily.     Marland Kitchen HYDROcodone-acetaminophen (NORCO) 7.5-325 MG tablet Take 1 tablet by mouth every 6 (six) hours as needed for moderate pain. 20 tablet 0  . levocetirizine (XYZAL) 5 MG tablet Take 5 mg by mouth every morning.    . Loperamide HCl (IMODIUM PO) Take 2 mg by mouth daily as needed (upset stomach).     . memantine (NAMENDA) 10 MG tablet Take 10 mg by mouth 2 (two) times daily.     . metoprolol succinate (TOPROL-XL) 25 MG 24 hr tablet Take 25 mg by mouth daily.    . promethazine (PHENERGAN) 25 MG suppository Place 1 suppository (25 mg total) rectally every 6 (six) hours as needed for nausea or vomiting. 12 suppository 1  . spironolactone (ALDACTONE) 25 MG tablet Take 25 mg by mouth daily.    Marland Kitchen terazosin (HYTRIN) 5 MG capsule Take 5 mg by mouth at bedtime.    . valsartan (DIOVAN) 80 MG tablet Take 80 mg by mouth daily.      No current facility-administered medications for this visit.     Allergies:  Allergies  Allergen Reactions  . Shellfish Allergy Hives and Shortness Of Breath  . Codeine Nausea Only  . Lisinopril     Cough memory difficulty       Physical Exam:  Blood pressure 135/75, pulse (!) 54, temperature (!) 97.5 F (36.4 C), temperature source Oral, resp. rate 14, weight 174 lb 9.6 oz (79.2  kg), SpO2 95 %.   ECOG:  2   General appearance: Comfortable appearing without any discomfort Head: Normocephalic without any trauma Oropharynx: Mucous membranes are moist and pink without any thrush or ulcers. Eyes: Pupils are equal and round reactive to light. Lymph nodes: No cervical, supraclavicular, inguinal or axillary lymphadenopathy.   Heart:regular rate and rhythm.  S1 and S2 without leg edema. Lung: Clear without any rhonchi or wheezes.  No dullness to percussion. Abdomin: Soft, nontender, nondistended with good bowel sounds.  No hepatosplenomegaly. Musculoskeletal: No joint deformity or effusion.  Full range of motion noted. Neurological: No  deficits noted on motor, sensory and deep tendon reflex exam. Skin: Well-healed scar noted in his left cheek and neck.  Small pigmented lesions noted on his scalp and nose.    Lab Results: Lab Results  Component Value Date   WBC 10.5 08/15/2019   HGB 14.5 08/15/2019   HCT 46.5 08/15/2019   MCV 98.5 08/15/2019   PLT 226 08/15/2019     Chemistry      Component Value Date/Time   NA 142 08/15/2019 0906   K 4.2 08/15/2019 0906   CL 110 08/15/2019 0906   CO2 23 08/15/2019 0906   BUN 21 08/15/2019 0906   CREATININE 1.63 (H) 08/15/2019 0906      Component Value Date/Time   CALCIUM 8.6 (L) 08/15/2019 0906       Impression and Plan:  86 year old man with:  1.  Nodular melanoma of the left temple diagnosed December 2020.  He underwent wide excision and found to have T4BN0 ulcerated melanoma with 0 out of 9 lymph nodes involved.  The natural course of this disease was reviewed at this time and treatment options were reiterated.  He has certainly has high risk disease with a risk of relapse reasonably high and would likely require systemic treatment if he developed recurrent disease.  At this time, there is no indication for treatment with a negative PET scan completed in February 2021.  I recommended continued active surveillance and repeat pet imaging in the next few months and return evaluation at that time.   2.  Basal cell carcinoma of the nose: He is evaluated for Mohs surgery in the near future by Dr. Mia Lynch.  3.  Follow-up: Will be in 2 to 3 months after repeat imaging studies.   30  minutes were spent on this encounter.  The time was spent on reviewing pathology reports, treatment options and future plan of care reviewed.     Zola Button, MD 7/16/20213:56 PM

## 2019-11-18 ENCOUNTER — Ambulatory Visit: Payer: Medicare Other | Admitting: Plastic Surgery

## 2019-11-18 DIAGNOSIS — C44311 Basal cell carcinoma of skin of nose: Secondary | ICD-10-CM | POA: Diagnosis not present

## 2019-11-19 ENCOUNTER — Telehealth: Payer: Self-pay | Admitting: Oncology

## 2019-11-19 ENCOUNTER — Ambulatory Visit: Payer: Medicare Other | Admitting: Plastic Surgery

## 2019-11-19 ENCOUNTER — Other Ambulatory Visit: Payer: Self-pay

## 2019-11-19 ENCOUNTER — Encounter: Payer: Self-pay | Admitting: Plastic Surgery

## 2019-11-19 VITALS — BP 99/61 | HR 76 | Temp 97.7°F

## 2019-11-19 DIAGNOSIS — C44311 Basal cell carcinoma of skin of nose: Secondary | ICD-10-CM

## 2019-11-19 NOTE — Progress Notes (Signed)
Referring Provider Renaldo Reel, PA [place],  [place] [id number]   CC:  Chief Complaint  Patient presents with  . Follow-up      Kevin Lynch. is an 86 y.o. male.  HPI: Patient presents to discuss reconstruction after Mohs excision of the nasal basal cell carcinoma.  This was done by Dr. Winifred Olive.  He is left with a fairly extensive nasal defect and wants to discuss his reconstructive options.  He has had a number of skin cancers excised in the past and has had a melanoma removed from his left cheek which is still healing.  He is here with his caregiver who has commented on the worsening of his mental status recently due to a number of medical issues.  Allergies  Allergen Reactions  . Shellfish Allergy Hives and Shortness Of Breath  . Codeine Nausea Only  . Lisinopril     Cough memory difficulty     Outpatient Encounter Medications as of 11/19/2019  Medication Sig  . albuterol (PROVENTIL) (2.5 MG/3ML) 0.083% nebulizer solution Take 2.5 mg by nebulization every 6 (six) hours as needed for wheezing or shortness of breath.  Marland Kitchen BREO ELLIPTA 100-25 MCG/INH AEPB Inhale 1 puff into the lungs daily.   . Coenzyme Q10 (COQ-10) 100 MG CAPS Take 100 mg by mouth daily.   . cyanocobalamin (,VITAMIN B-12,) 1000 MCG/ML injection Inject 1,000 mcg into the muscle every 30 (thirty) days.   Marland Kitchen donepezil (ARICEPT) 10 MG tablet Take 10 mg by mouth at bedtime.  . ergocalciferol (VITAMIN D2) 1.25 MG (50000 UT) capsule Take 1 capsule by mouth once a week.  . escitalopram (LEXAPRO) 20 MG tablet Take 20 mg by mouth daily.   Marland Kitchen HYDROcodone-acetaminophen (NORCO) 7.5-325 MG tablet Take 1 tablet by mouth every 6 (six) hours as needed for moderate pain.  Marland Kitchen levocetirizine (XYZAL) 5 MG tablet Take 5 mg by mouth every morning.  . Loperamide HCl (IMODIUM PO) Take 2 mg by mouth daily as needed (upset stomach).   . memantine (NAMENDA) 10 MG tablet Take 10 mg by mouth 2 (two) times daily.   .  metoprolol succinate (TOPROL-XL) 25 MG 24 hr tablet Take 25 mg by mouth daily.  . promethazine (PHENERGAN) 25 MG suppository Place 1 suppository (25 mg total) rectally every 6 (six) hours as needed for nausea or vomiting.  Marland Kitchen spironolactone (ALDACTONE) 25 MG tablet Take 25 mg by mouth daily.  Marland Kitchen terazosin (HYTRIN) 5 MG capsule Take 5 mg by mouth at bedtime.  . valsartan (DIOVAN) 80 MG tablet Take 80 mg by mouth daily.    No facility-administered encounter medications on file as of 11/19/2019.     Past Medical History:  Diagnosis Date  . Actinic keratosis   . Anemia   . Arthritis   . Basal cell carcinoma    skin - chest, back and face  . Cataracts, bilateral    removed by surgery  . COPD (chronic obstructive pulmonary disease) ([place])   . Dementia ([place])   . Depression   . Diastolic heart failure ([place])   . Former smoker    quit 1983-smoked 30 yrs  . Gastric ulcer    from 06/06/15: ULCER/GASTRITIS/ANEMIA:  . HDL lipoprotein deficiency   . History of blood transfusion   . History of stroke   . Hyperglycemia   . Hyperlipidemia    no meds  . Hypertension   . Pneumonia    x 1  . Pre-diabetes  does not check blood sugar  . Prediabetes   . Restless leg syndrome   . Stroke ([place]) 2012  . Wears glasses     Past Surgical History:  Procedure Laterality Date  . CATARACT EXTRACTION    . COLONOSCOPY    . DRUG INDUCED ENDOSCOPY    . HEMORROIDECTOMY    . LESION EXCISION WITH COMPLEX REPAIR Left 08/19/2019   Procedure: LEFT FACIAL MELANOMA EXCISION WITH COMPLEX REPAIR;  Surgeon: Izora Gala, MD;  Location: [facility];  Service: ENT;  Laterality: Left;  . LYMPH NODE BIOPSY Left 08/19/2019   Procedure: LEFT LEVEL THREE SENTINEL LYMPH NODE DISSECTION/BIOPSY;  Surgeon: Izora Gala, MD;  Location: [facility];  Service: ENT;  Laterality: Left;  Marland Kitchen MELANOMA EXCISION    . NASAL SEPTUM SURGERY    . PAROTIDECTOMY Left 08/19/2019   LEFT SUPERFICIAL PAROTIDECTOMY, with facial nerve dissection  .  PAROTIDECTOMY Left 08/19/2019   Procedure: LEFT PAROTIDECTOMY;  Surgeon: Izora Gala, MD;  Location: [facility];  Service: ENT;  Laterality: Left;  . PROSTATE SURGERY    . SENTINEL LYMPH NODE BIOPSY  08/19/2019   LEFT LEVEL THREE SENTINEL LYMPH NODE DISSECTION/BIOPSY  . SKIN FULL THICKNESS GRAFT Left 08/19/2019   Procedure: SKIN GRAFT FULL THICKNESS;  Surgeon: Izora Gala, MD;  Location: [facility];  Service: ENT;  Laterality: Left;  Marland Kitchen VASECTOMY      Family History  Problem Relation Age of Onset  . Stroke Mother   . Hypertension Mother   . CVA Mother   . Heart attack Mother   . Hypertension Father   . Stroke Father   . Heart attack Father   . Asthma Brother   . Hypertension Brother   . Heart attack Brother   . Hyperlipidemia Brother     Social History   Social History Narrative  . Not on file     Review of Systems General: Denies fevers, chills, weight loss CV: Denies chest pain, shortness of breath, palpitations  Physical Exam Vitals with BMI 11/19/2019 11/14/2019 11/10/2019  Height - - 5\' 3"   Weight - 174 lbs 10 oz 170 lbs  BMI - 60.73 71.06  Systolic 99 269 485  Diastolic 61 75 66  Pulse 76 54 56    General:  No acute distress,  Alert and oriented, Non-Toxic, Normal speech and affect HEENT: Normocephalic.  He has an extensive nasal defect involving the majority of the tip and upper portion of the columella.  The entirety of the right alar rim is gone full-thickness.  Some of the lower lateral and upper lateral cartilages exposed on the right.  He has a left forehead vertical scar but no scars on the right side of his forehead.  There is no scars behind his right ear.  He is still granulating over a healing melanoma excision in his left cheek area.  Assessment/Plan Patient presents with an extensive nasal defect after Mohs excision.  We discussed various options for his reconstruction.  He does have quite a bit of medical issues and so we did discuss just allowing this to heal by  secondary intention.  I explained how I believed it would look in the end and he was unsatisfied with that option which I totally agree with.  The real reconstructive operation for him is a forehead flap that is folded over to do internal lining around a ear cartilage graft to try to give some support to the right ala which is now completely collapsed.  We discussed the  risks of the procedure that include bleeding, infection, damage to surrounding structures, need for additional procedures.  We discussed the potential for partial or total flap loss.  I discussed the fact that this was a two-stage procedure and the second stage could be done in the office without general anesthesia.  I discussed the fact that would we would need PCP and cardiology clearance likely to do this case in the operating room.  I showed both the patient and his caregiver pictures of previous patients so that they can fully understand the details of the operation and what to expect postoperatively.  They are fully understanding we will plan to get this scheduled for him.  Cindra Presume 11/19/2019, 4:40 PM

## 2019-11-19 NOTE — Telephone Encounter (Signed)
Scheduled per 07/16 los, called and spoke with patient's wife. Patient will be notified of upcoming appointments.

## 2019-11-20 ENCOUNTER — Telehealth: Payer: Self-pay

## 2019-11-20 NOTE — Telephone Encounter (Signed)
   [facility] Pre-operative Risk Assessment    HEARTCARE STAFF: - Please ensure there is not already an duplicate clearance open for this procedure. - Under Visit Info/Reason for Call, type in Other and utilize the format Clearance MM/DD/YY or Clearance TBD. Do not use dashes or single digits. - If request is for dental extraction, please clarify the # of teeth to be extracted.  Request for surgical clearance:  1. What type of surgery is being performed? Reconstruction of the nasal Mohs defect with advancement flap/adjacent tissue   2. When is this surgery scheduled? 12/01/19   3. What type of clearance is required (medical clearance vs. Pharmacy clearance to hold med vs. Both)? Both  4. Are there any medications that need to be held prior to surgery and how long?None specified   5. Practice name and name of physician performing surgery? [facility]. Dr. Claudia Desanctis  6. What is the office phone number? [phone]   7.   What is the office fax number? [phone]  8.   Anesthesia type (None, local, MAC, general) ? General  Tried calling the patient to set up an appointment with [place] and he stated that he was last told by Dr. Bettina Gavia to follow up as needed. We also do not have any appointments open before this scheduled surgery date.   Gita Kudo 11/20/2019, 11:08 AM  _________________________________________________________________   (provider comments below)

## 2019-11-21 NOTE — Telephone Encounter (Signed)
Primary Cardiologist:Kevin Bettina Gavia, MD  Chart reviewed as part of pre-operative protocol coverage. Because of Kevin Steib Cordaro Jr.'s past medical history and time since last visit, he/she will require a follow-up visit in order to better assess preoperative cardiovascular risk.  Pre-op covering staff: - Please schedule appointment and call patient to inform them. - Please contact requesting surgeon's office via preferred method (i.e, phone, fax) to inform them of need for appointment prior to surgery.  If applicable, this message will also be routed to pharmacy pool and/or primary cardiologist for input on holding anticoagulant/antiplatelet agent as requested below so that this information is available at time of patient's appointment.   Kevin Pelton, NP  11/21/2019, 7:59 AM

## 2019-11-21 NOTE — Telephone Encounter (Signed)
Called wife and scheduled an appt for pre-op clearance 11/27/2019 @ 10:00 AM with Dr Bettina Gavia

## 2019-11-21 NOTE — Telephone Encounter (Signed)
Forwarded to requesting party via Epic fax function 

## 2019-11-26 ENCOUNTER — Encounter: Payer: Self-pay | Admitting: Surgical

## 2019-11-26 NOTE — Progress Notes (Signed)
Medical Clearance has been received from Delfin Gant, PA-C for patient's upcoming surgery reconstruction of nasal Mohs defect with advancement flap/adjacent tissue transfer with Dr. Claudia Desanctis. Medications to hold prior to surgery Plavix (Stop taking: 5 days prior to surgery begin retaking 5 days after surgery).  Spoke with patient's wife who will hold medication. They are seeing cardiology tomorrow for clearance as well.

## 2019-11-26 NOTE — Progress Notes (Signed)
Cardiology Office Note:    Date:  11/27/2019   ID:  Kevin Keel., DOB 11/01/1932, MRN [id number]  PCP:  Renaldo Reel, PA  Cardiologist:  Shirlee More, MD    Referring MD: Renaldo Reel, PA    ASSESSMENT:    1. Preop cardiovascular exam   2. Hypertensive heart disease, unspecified whether heart failure present   3. Enlarged thoracic aorta ([place])   4. Chronic kidney disease, unspecified CKD stage   5. Chronic obstructive pulmonary disease, unspecified COPD type ([place])   6. Bilateral carotid artery disease, unspecified type ([place])    PLAN:    In order of problems listed above:  1. His plastic surgery is elective low to intermediate risk in my opinion he is optimized I would proceed with his planned surgery without further preoperative cardiology evaluation.  His wife tells me to be admitted overnight I placed him in a monitored bed looking for bradycardia check an EKG postoperative day 1. 2. Stable BP at target continue current treatment including ARB MRA 3. Stable mild enlargement of thoracic aorta he does not have an aneurysm 4. Stable kidney disease 5. Stable COPD not short of breath continue his bronchodilators perioperatively 6. Stable on recent duplex no significant obstructive stenosis   Next appointment: 3 months with me in the office   Medication Adjustments/Labs and Tests Ordered: Current medicines are reviewed at length with the patient today.  Concerns regarding medicines are outlined above.  Orders Placed This Encounter  Procedures  . EKG 12-Lead   No orders of the defined types were placed in this encounter.   Chief Complaint  Patient presents with  . Pre-op Exam    History of Present Illness:    Kevin Lynch. is a 86 y.o. male with a hx of heart failure last seen 03/13/2018.  Other problems include hypertensive heart disease with heart failure enlargement of the thoracic aorta.  Echocardiogram performed March 2019 showed mild concentric LVH  ejection fraction 60 to 65% enlargement ascending aorta 38 mm.  2017 he had a GI bleed with gastric ulcer and required transfusion.  CTA of the chest October 2019 no coronary artery calcification ascending aorta 39 mm maximum diameter as well as lung disease with honeycombing traction bronchiectasis and groundglass appearance.  Compliance with diet, lifestyle and medications: Yes  He is scheduled for reconstruction of nasal defect by Dr. Silverio Lay 12/01/2019  Record review from [facility] 04/07/2019 showed he had an echocardiogram performed showing normal left ventricular size and function and no significant valvular abnormality the record says it was performed for syncope.  At the same time he had cerebrovascular duplex performed showing what was described as a large amount of atherosclerotic plaque without hemodynamically significant in the left carotid and minimal amount of plaque in the right carotid.  He had a CT angiogram of the chest 04/15/2019 that showed enlargement ascending aorta 3.7 cm unchanged from previous and coronary artery calcification and lung changes consistent with interstitial lung disease.  EKG 08/27/2019 showed sinus bradycardia 52 bpm first-degree AV block otherwise normal.  He had an emergency room visit 08/27/2019 with a fall and dizziness his blood pressure is 132/61 his pulse was 60 he was diagnosed as vertigo and sustained 3 rib fractures and was transferred to [facility] trauma surgery service.  His hemoglobin was 13.5 creatinine 1.50 he had no pulmonary or vascular trauma he also had a CT of the head which showed no acute intracranial process or cervical fracture.  He is seen in the office along with his wife who participates in the evaluation and decision making.  After admission [facility] with trauma he had a lot of trouble with delirium and confusion and a rehab stay has returned home but is never gotten to his previous level of function.  He is having no chest  pain shortness of breath palpitation or syncope but he is taking combined Aricept and Namenda and a beta-blocker.  Will reduce the dose of his beta-blocker 50% I do not want to stop it prior to his elective surgery but afterwards his wife will call us will apply and I am ambulatory heart rhythm monitor and my intent will be to stop his beta-blocker.  His wife tells me be kept in observation overnight and should be on a monitored bed and do an EKG postoperative day 1.  He has normal left ventricular function stable enlargement ascending aorta and I do not think he needs further preoperative cardiology evaluation. Past Medical History:  Diagnosis Date  . Actinic keratosis   . Anemia   . Arthritis   . Basal cell carcinoma    skin - chest, back and face  . Cataracts, bilateral    removed by surgery  . COPD (chronic obstructive pulmonary disease) ([place])   . Dementia ([place])   . Depression   . Diastolic heart failure ([place])   . Former smoker    quit 1983-smoked 30 yrs  . Gastric ulcer    from 06/06/15: ULCER/GASTRITIS/ANEMIA:  . HDL lipoprotein deficiency   . History of blood transfusion   . History of stroke   . Hyperglycemia   . Hyperlipidemia    no meds  . Hypertension   . Pneumonia    x 1  . Pre-diabetes    does not check blood sugar  . Prediabetes   . Restless leg syndrome   . Stroke ([place]) 2012  . Wears glasses     Past Surgical History:  Procedure Laterality Date  . CATARACT EXTRACTION    . COLONOSCOPY    . DRUG INDUCED ENDOSCOPY    . HEMORROIDECTOMY    . LESION EXCISION WITH COMPLEX REPAIR Left 08/19/2019   Procedure: LEFT FACIAL MELANOMA EXCISION WITH COMPLEX REPAIR;  Surgeon: Izora Gala, MD;  Location: [facility];  Service: ENT;  Laterality: Left;  . LYMPH NODE BIOPSY Left 08/19/2019   Procedure: LEFT LEVEL THREE SENTINEL LYMPH NODE DISSECTION/BIOPSY;  Surgeon: Izora Gala, MD;  Location: [facility];  Service: ENT;  Laterality: Left;  Marland Kitchen MELANOMA EXCISION    . NASAL SEPTUM SURGERY     . PAROTIDECTOMY Left 08/19/2019   LEFT SUPERFICIAL PAROTIDECTOMY, with facial nerve dissection  . PAROTIDECTOMY Left 08/19/2019   Procedure: LEFT PAROTIDECTOMY;  Surgeon: Izora Gala, MD;  Location: [facility];  Service: ENT;  Laterality: Left;  . PROSTATE SURGERY    . SENTINEL LYMPH NODE BIOPSY  08/19/2019   LEFT LEVEL THREE SENTINEL LYMPH NODE DISSECTION/BIOPSY  . SKIN FULL THICKNESS GRAFT Left 08/19/2019   Procedure: SKIN GRAFT FULL THICKNESS;  Surgeon: Izora Gala, MD;  Location: [facility];  Service: ENT;  Laterality: Left;  Marland Kitchen VASECTOMY      Current Medications: Current Meds  Medication Sig  . albuterol (PROVENTIL) (2.5 MG/3ML) 0.083% nebulizer solution Take 2.5 mg by nebulization every 6 (six) hours as needed for wheezing or shortness of breath.  Marland Kitchen BREO ELLIPTA 100-25 MCG/INH AEPB Inhale 1 puff into the lungs daily.   . Coenzyme Q10 (COQ-10) 100  MG CAPS Take 100 mg by mouth daily.   . cyanocobalamin (,VITAMIN B-12,) 1000 MCG/ML injection Inject 1,000 mcg into the muscle every 30 (thirty) days.   Marland Kitchen donepezil (ARICEPT) 10 MG tablet Take 10 mg by mouth at bedtime.  . ergocalciferol (VITAMIN D2) 1.25 MG (50000 UT) capsule Take 50,000 Units by mouth once a week.   . escitalopram (LEXAPRO) 20 MG tablet Take 20 mg by mouth daily.   . QUEtiapine (SEROQUEL) 25 MG tablet Take 25 mg by mouth 2 (two) times daily.  Marland Kitchen spironolactone (ALDACTONE) 25 MG tablet Take 25 mg by mouth daily.  Marland Kitchen terazosin (HYTRIN) 5 MG capsule Take 5 mg by mouth at bedtime.  . valsartan (DIOVAN) 80 MG tablet Take 80 mg by mouth daily.      Allergies:   Shellfish allergy, Codeine, and Lisinopril   Social History   Socioeconomic History  . Marital status: Married    Spouse name: Not on file  . Number of children: Not on file  . Years of education: Not on file  . Highest education level: Not on file  Occupational History  . Not on file  Tobacco Use  . Smoking status: Former Smoker    Packs/day: 0.50    Years: 30.00      Pack years: 15.00    Types: Cigarettes    Quit date: 05/01/1981    Years since quitting: 38.6  . Smokeless tobacco: Former Systems developer    Types: Secondary school teacher  . Vaping Use: Never used  Substance and Sexual Activity  . Alcohol use: Not Currently  . Drug use: Never  . Sexual activity: Not on file  Other Topics Concern  . Not on file  Social History Narrative  . Not on file   Social Determinants of Health   Financial Resource Strain:   . Difficulty of Paying Living Expenses:   Food Insecurity:   . Worried About Charity fundraiser in the Last Year:   . Arboriculturist in the Last Year:   Transportation Needs:   . Film/video editor (Medical):   Marland Kitchen Lack of Transportation (Non-Medical):   Physical Activity:   . Days of Exercise per Week:   . Minutes of Exercise per Session:   Stress:   . Feeling of Stress :   Social Connections:   . Frequency of Communication with Friends and Family:   . Frequency of Social Gatherings with Friends and Family:   . Attends Religious Services:   . Active Member of Clubs or Organizations:   . Attends Archivist Meetings:   Marland Kitchen Marital Status:      Family History: The patient's family history includes Asthma in his brother; CVA in his mother; Heart attack in his brother, father, and mother; Hyperlipidemia in his brother; Hypertension in his brother, father, and mother; Stroke in his father and mother. ROS:   Please see the history of present illness.    All other systems reviewed and are negative.  EKGs/Labs/Other Studies Reviewed:    The following studies were reviewed today:  EKG:  EKG ordered today and personally reviewed.  The ekg ordered today demonstrates sinus bradycardia 57 bpm first-degree AV block otherwise normal  Recent Labs: 08/15/2019: BUN 21; Creatinine, Ser 1.63; Hemoglobin 14.5; Platelets 226; Potassium 4.2; Sodium 142    Physical Exam:    VS:  BP 110/68 (BP Location: Left Arm, Patient Position: Sitting, Cuff  Size: Normal)   Pulse 64   Ht  5\' 3"  (1.6 m)   Wt 172 lb 9.6 oz (78.3 kg)   SpO2 95%   BMI 30.57 kg/m     Wt Readings from Last 3 Encounters:  11/27/19 172 lb 9.6 oz (78.3 kg)  11/14/19 174 lb 9.6 oz (79.2 kg)  11/10/19 170 lb (77.1 kg)     GEN: Very apathetic and passive does not participate in evaluation well nourished, well developed in no acute distress HEENT: Normal NECK: No JVD; No carotid bruits LYMPHATICS: No lymphadenopathy CARDIAC: RRR, no murmurs, rubs, gallops RESPIRATORY:  Clear to auscultation without rales, wheezing or rhonchi  ABDOMEN: Soft, non-tender, non-distended MUSCULOSKELETAL:  No edema; No deformity  SKIN: Warm and dry NEUROLOGIC:  Alert and oriented x 3 PSYCHIATRIC:  Normal affect    Signed, Shirlee More, MD  11/27/2019 11:24 AM    [place]

## 2019-11-26 NOTE — H&P (View-Only) (Signed)
Medical Clearance has been received from Delfin Gant, PA-C for patient's upcoming surgery reconstruction of nasal Mohs defect with advancement flap/adjacent tissue transfer with Dr. Claudia Desanctis. Medications to hold prior to surgery Plavix (Stop taking: 5 days prior to surgery begin retaking 5 days after surgery).  Spoke with patient's wife who will hold medication. They are seeing cardiology tomorrow for clearance as well.

## 2019-11-27 ENCOUNTER — Other Ambulatory Visit (HOSPITAL_COMMUNITY)
Admission: RE | Admit: 2019-11-27 | Discharge: 2019-11-27 | Disposition: A | Discharge status: Home / Self Care | Payer: Medicare Other | Source: Ambulatory Visit | Attending: Plastic Surgery | Admitting: Plastic Surgery

## 2019-11-27 ENCOUNTER — Encounter: Payer: Self-pay | Admitting: Cardiology

## 2019-11-27 ENCOUNTER — Other Ambulatory Visit: Payer: Self-pay

## 2019-11-27 ENCOUNTER — Ambulatory Visit: Payer: Medicare Other | Admitting: Cardiology

## 2019-11-27 VITALS — BP 110/68 | HR 64 | Ht 63.0 in | Wt 172.6 lb

## 2019-11-27 DIAGNOSIS — Z20822 Contact with and (suspected) exposure to covid-19: Secondary | ICD-10-CM | POA: Insufficient documentation

## 2019-11-27 DIAGNOSIS — Z01810 Encounter for preprocedural cardiovascular examination: Secondary | ICD-10-CM

## 2019-11-27 DIAGNOSIS — Z01812 Encounter for preprocedural laboratory examination: Secondary | ICD-10-CM | POA: Diagnosis not present

## 2019-11-27 DIAGNOSIS — N189 Chronic kidney disease, unspecified: Secondary | ICD-10-CM | POA: Diagnosis not present

## 2019-11-27 DIAGNOSIS — J449 Chronic obstructive pulmonary disease, unspecified: Secondary | ICD-10-CM | POA: Diagnosis not present

## 2019-11-27 DIAGNOSIS — I779 Disorder of arteries and arterioles, unspecified: Secondary | ICD-10-CM

## 2019-11-27 DIAGNOSIS — I7789 Other specified disorders of arteries and arterioles: Secondary | ICD-10-CM

## 2019-11-27 DIAGNOSIS — I119 Hypertensive heart disease without heart failure: Secondary | ICD-10-CM

## 2019-11-27 LAB — SARS CORONAVIRUS 2 (TAT 6-24 HRS): SARS Coronavirus 2: NEGATIVE

## 2019-11-27 NOTE — Patient Instructions (Signed)
Medication Instructions:  Your physician has recommended you make the following change in your medication:  DECREASE: Toprol XL 12.5 MG take 0.5 tablet by mouth daily.  *If you need a refill on your cardiac medications before your next appointment, please call your pharmacy*   Lab Work: None If you have labs (blood work) drawn today and your tests are completely normal, you will receive your results only by: Marland Kitchen MyChart Message (if you have MyChart) OR . A paper copy in the mail If you have any lab test that is abnormal or we need to change your treatment, we will call you to review the results.   Testing/Procedures: A zio monitor was ordered today. It will remain on for7 days. You will then return monitor and event diary in provided box. It takes 1-2 weeks for report to be downloaded and returned to [place]. We will call you with the results. If monitor falls off or has orange flashing light, please call Zio for further instructions.   This heart monitor will be mailed to you so that you can put it on after surgery.     Follow-Up: At [facility], you and your health needs are our priority.  As part of our continuing mission to provide you with exceptional heart care, we have created designated Provider Care Teams.  These Care Teams include your primary Cardiologist (physician) and Advanced Practice Providers (APPs -  Physician Assistants and Nurse Practitioners) who all work together to provide you with the care you need, when you need it.  We recommend signing up for the patient portal called "MyChart".  Sign up information is provided on this After Visit Summary.  MyChart is used to connect with patients for Virtual Visits (Telemedicine).  Patients are able to view lab/test results, encounter notes, upcoming appointments, etc.  Non-urgent messages can be sent to your provider as well.   To learn more about what you can do with MyChart, go to [email or web address].    Your next  appointment:   6 week(s)  The format for your next appointment:   In Person  Provider:   Shirlee More, MD   Other Instructions

## 2019-11-28 ENCOUNTER — Encounter (HOSPITAL_COMMUNITY): Payer: Self-pay | Admitting: Plastic Surgery

## 2019-11-28 ENCOUNTER — Other Ambulatory Visit: Payer: Self-pay

## 2019-11-28 NOTE — Progress Notes (Signed)
Spoke with pt's wife, Anne Ng for pre-op call. DPR on file, pt has dementia. Pt has hx of enlarged aorta, seen by Dr. Bettina Gavia yesterday, 11/27/19 and received cardiac clearance. Anne Ng states that pt is pre-diabetic, does not check his blood sugar at home and has never been on medications for it. Last A1C was 5.9 on 08/01/19.   Covid test done 11/27/19 and it's negative. Anne Ng states pt has been in quarantine since the test was done and understands that he needs to stay in quarantine until he comes to the hospital on Monday.  Anne Ng will need to come to pre-op with pt due to the fact he has dementia

## 2019-11-30 NOTE — Anesthesia Preprocedure Evaluation (Addendum)
Anesthesia Evaluation  Patient identified by MRN, date of birth, ID band Patient awake and Patient confused    Reviewed: Allergy & Precautions, NPO status , Patient's Chart, lab work & pertinent test results, reviewed documented beta blocker date and time   History of Anesthesia Complications Negative for: history of anesthetic complications  Airway Mallampati: II  TM Distance: >3 FB Neck ROM: Full    Dental  (+) Missing,    Pulmonary COPD,  COPD inhaler, former smoker,    Pulmonary exam normal        Cardiovascular hypertension, Pt. on medications and Pt. on home beta blockers +CHF  Normal cardiovascular exam  TTE 2019: mild LVH, EF 08-13%, grade 1 diastolic dysfunction, ascending aorta mildly dilated    Neuro/Psych Depression Dementia Ambulates with walker CVA, No Residual Symptoms    GI/Hepatic Neg liver ROS, PUD,   Endo/Other  negative endocrine ROS  Renal/GU Renal InsufficiencyRenal disease (Cr 1.62, K 3.4, Na 129)  negative genitourinary   Musculoskeletal negative musculoskeletal ROS (+)   Abdominal   Peds  Hematology negative hematology ROS (+)   Anesthesia Other Findings Basal cell carcinoma of nose  Reproductive/Obstetrics negative OB ROS                            Anesthesia Physical Anesthesia Plan  ASA: II  Anesthesia Plan: General   Post-op Pain Management:    Induction: Intravenous  PONV Risk Score and Plan: 3 and Ondansetron and Treatment may vary due to age or medical condition  Airway Management Planned: Oral ETT  Additional Equipment: None  Intra-op Plan:   Post-operative Plan: Extubation in OR  Informed Consent: I have reviewed the patients History and Physical, chart, labs and discussed the procedure including the risks, benefits and alternatives for the proposed anesthesia with the patient or authorized representative who has indicated his/her  understanding and acceptance.     Dental advisory given and Consent reviewed with POA  Plan Discussed with: CRNA  Anesthesia Plan Comments:        Anesthesia Quick Evaluation

## 2019-12-01 ENCOUNTER — Ambulatory Visit (HOSPITAL_COMMUNITY): Payer: Medicare Other | Admitting: Anesthesiology

## 2019-12-01 ENCOUNTER — Other Ambulatory Visit: Payer: Self-pay | Admitting: Surgical

## 2019-12-01 ENCOUNTER — Encounter (HOSPITAL_COMMUNITY): Payer: Self-pay | Admitting: Plastic Surgery

## 2019-12-01 ENCOUNTER — Observation Stay (HOSPITAL_COMMUNITY)
Admission: RE | Admit: 2019-12-01 | Discharge: 2019-12-01 | Disposition: A | Discharge status: Home / Self Care | Payer: Medicare Other | Attending: Plastic Surgery | Admitting: Plastic Surgery

## 2019-12-01 ENCOUNTER — Telehealth: Payer: Self-pay | Admitting: Plastic Surgery

## 2019-12-01 ENCOUNTER — Ambulatory Visit: Payer: Medicare Other | Admitting: Cardiology

## 2019-12-01 ENCOUNTER — Encounter (HOSPITAL_COMMUNITY)
Admission: RE | Disposition: A | Discharge status: Home / Self Care | Payer: Self-pay | Source: Home / Self Care | Attending: Plastic Surgery

## 2019-12-01 ENCOUNTER — Other Ambulatory Visit: Payer: Self-pay

## 2019-12-01 DIAGNOSIS — C44311 Basal cell carcinoma of skin of nose: Secondary | ICD-10-CM | POA: Diagnosis not present

## 2019-12-01 DIAGNOSIS — M959 Acquired deformity of musculoskeletal system, unspecified: Secondary | ICD-10-CM | POA: Diagnosis present

## 2019-12-01 DIAGNOSIS — Z9889 Other specified postprocedural states: Secondary | ICD-10-CM

## 2019-12-01 DIAGNOSIS — L988 Other specified disorders of the skin and subcutaneous tissue: Secondary | ICD-10-CM

## 2019-12-01 DIAGNOSIS — Z7901 Long term (current) use of anticoagulants: Secondary | ICD-10-CM | POA: Insufficient documentation

## 2019-12-01 DIAGNOSIS — S0180XA Unspecified open wound of other part of head, initial encounter: Secondary | ICD-10-CM | POA: Diagnosis not present

## 2019-12-01 DIAGNOSIS — I5030 Unspecified diastolic (congestive) heart failure: Secondary | ICD-10-CM | POA: Diagnosis not present

## 2019-12-01 DIAGNOSIS — I130 Hypertensive heart and chronic kidney disease with heart failure and stage 1 through stage 4 chronic kidney disease, or unspecified chronic kidney disease: Secondary | ICD-10-CM | POA: Diagnosis not present

## 2019-12-01 DIAGNOSIS — N189 Chronic kidney disease, unspecified: Secondary | ICD-10-CM | POA: Diagnosis not present

## 2019-12-01 HISTORY — DX: Other specified disorders of the skin and subcutaneous tissue: L98.8

## 2019-12-01 HISTORY — PX: ADJACENT TISSUE TRANSFER/TISSUE REARRANGEMENT: SHX6829

## 2019-12-01 HISTORY — DX: Diverticulitis of intestine, part unspecified, without perforation or abscess without bleeding: K57.92

## 2019-12-01 HISTORY — DX: Malignant melanoma of skin, unspecified: C43.9

## 2019-12-01 HISTORY — DX: Other specified postprocedural states: Z98.890

## 2019-12-01 LAB — CBC
HCT: 41.9 % (ref 39.0–52.0)
Hemoglobin: 13 g/dL (ref 13.0–17.0)
MCH: 29.9 pg (ref 26.0–34.0)
MCHC: 31 g/dL (ref 30.0–36.0)
MCV: 96.3 fL (ref 80.0–100.0)
Platelets: 232 10*3/uL (ref 150–400)
RBC: 4.35 MIL/uL (ref 4.22–5.81)
RDW: 12.8 % (ref 11.5–15.5)
WBC: 8.6 10*3/uL (ref 4.0–10.5)
nRBC: 0 % (ref 0.0–0.2)

## 2019-12-01 LAB — BASIC METABOLIC PANEL
Anion gap: 5 (ref 5–15)
BUN: 36 mg/dL — ABNORMAL HIGH (ref 8–23)
CO2: 21 mmol/L — ABNORMAL LOW (ref 22–32)
Calcium: 8.3 mg/dL — ABNORMAL LOW (ref 8.9–10.3)
Chloride: 103 mmol/L (ref 98–111)
Creatinine, Ser: 1.62 mg/dL — ABNORMAL HIGH (ref 0.61–1.24)
GFR calc Af Amer: 45 mL/min — ABNORMAL LOW (ref >60)
GFR calc non Af Amer: 38 mL/min — ABNORMAL LOW (ref >60)
Glucose, Bld: 104 mg/dL — ABNORMAL HIGH (ref 70–99)
Potassium: 3.4 mmol/L — ABNORMAL LOW (ref 3.5–5.1)
Sodium: 129 mmol/L — ABNORMAL LOW (ref 135–145)

## 2019-12-01 LAB — GLUCOSE, CAPILLARY
Glucose-Capillary: 114 mg/dL — ABNORMAL HIGH (ref 70–99)
Glucose-Capillary: 145 mg/dL — ABNORMAL HIGH (ref 70–99)

## 2019-12-01 SURGERY — ADJACENT TISSUE TRANSFER
Anesthesia: General | Site: Nose

## 2019-12-01 MED ORDER — TERAZOSIN HCL 5 MG PO CAPS: 5.0000 mg | ORAL_CAPSULE | Freq: Every day | ORAL | Status: DC

## 2019-12-01 MED ORDER — METOPROLOL SUCCINATE 12.5 MG HALF TABLET: 12.5000 mg | ORAL_TABLET | Freq: Every day | ORAL | Status: DC

## 2019-12-01 MED ORDER — GLYCOPYRROLATE PF 0.2 MG/ML IJ SOSY
PREFILLED_SYRINGE | INTRAMUSCULAR | Status: AC
  Filled 2019-12-01: qty 1

## 2019-12-01 MED ORDER — LIDOCAINE-EPINEPHRINE (PF) 1 %-1:200000 IJ SOLN
INTRAMUSCULAR | Status: AC
  Filled 2019-12-01: qty 30

## 2019-12-01 MED ORDER — DOUBLE ANTIBIOTIC 500-10000 UNIT/GM EX OINT
TOPICAL_OINTMENT | CUTANEOUS | Status: AC
  Filled 2019-12-01: qty 28.4

## 2019-12-01 MED ORDER — LIDOCAINE-EPINEPHRINE 1 %-1:100000 IJ SOLN
INTRAMUSCULAR | Status: DC | PRN
  Administered 2019-12-01: 40 mL

## 2019-12-01 MED ORDER — MINERAL OIL LIGHT OIL
TOPICAL_OIL | Freq: Once | Status: DC
  Filled 2019-12-01: qty 10

## 2019-12-01 MED ORDER — LIDOCAINE-EPINEPHRINE 1 %-1:100000 IJ SOLN
INTRAMUSCULAR | Status: AC
  Filled 2019-12-01: qty 1

## 2019-12-01 MED ORDER — PROPOFOL 1000 MG/100ML IV EMUL
INTRAVENOUS | Status: AC
  Filled 2019-12-01: qty 100

## 2019-12-01 MED ORDER — ROCURONIUM BROMIDE 10 MG/ML (PF) SYRINGE
PREFILLED_SYRINGE | INTRAVENOUS | Status: DC | PRN
  Administered 2019-12-01: 50 mg via INTRAVENOUS

## 2019-12-01 MED ORDER — LACTATED RINGERS IV SOLN: INTRAVENOUS | Status: DC | PRN

## 2019-12-01 MED ORDER — FENTANYL CITRATE (PF) 250 MCG/5ML IJ SOLN
INTRAMUSCULAR | Status: DC | PRN
  Administered 2019-12-01 (×2): 50 ug via INTRAVENOUS
  Administered 2019-12-01: 100 ug via INTRAVENOUS
  Administered 2019-12-01: 50 ug via INTRAVENOUS

## 2019-12-01 MED ORDER — HYDROCODONE-ACETAMINOPHEN 5-325 MG PO TABS: 1.0000 | ORAL_TABLET | Freq: Four times a day (QID) | ORAL | 0 refills | Status: AC | PRN

## 2019-12-01 MED ORDER — LIDOCAINE 2% (20 MG/ML) 5 ML SYRINGE
INTRAMUSCULAR | Status: AC
  Filled 2019-12-01: qty 5

## 2019-12-01 MED ORDER — OXYCODONE HCL 5 MG/5ML PO SOLN: 5.0000 mg | Freq: Once | ORAL | Status: DC | PRN

## 2019-12-01 MED ORDER — FENTANYL CITRATE (PF) 250 MCG/5ML IJ SOLN
INTRAMUSCULAR | Status: AC
  Filled 2019-12-01: qty 5

## 2019-12-01 MED ORDER — PHENYLEPHRINE 40 MCG/ML (10ML) SYRINGE FOR IV PUSH (FOR BLOOD PRESSURE SUPPORT): PREFILLED_SYRINGE | INTRAVENOUS | Status: DC | PRN

## 2019-12-01 MED ORDER — BUPIVACAINE HCL (PF) 0.25 % IJ SOLN
INTRAMUSCULAR | Status: AC
  Filled 2019-12-01: qty 30

## 2019-12-01 MED ORDER — BUPIVACAINE HCL 0.25 % IJ SOLN
INTRAMUSCULAR | Status: DC | PRN
  Administered 2019-12-01: 30 mL

## 2019-12-01 MED ORDER — PROPOFOL 10 MG/ML IV BOLUS
INTRAVENOUS | Status: AC
  Filled 2019-12-01: qty 20

## 2019-12-01 MED ORDER — MEMANTINE HCL 10 MG PO TABS: 10.0000 mg | ORAL_TABLET | Freq: Two times a day (BID) | ORAL | Status: DC

## 2019-12-01 MED ORDER — EPHEDRINE 5 MG/ML INJ
INTRAVENOUS | Status: AC
  Filled 2019-12-01: qty 10

## 2019-12-01 MED ORDER — OXYCODONE HCL 5 MG PO TABS: 5.0000 mg | ORAL_TABLET | Freq: Once | ORAL | Status: DC | PRN

## 2019-12-01 MED ORDER — SODIUM CHLORIDE 0.9 % IV SOLN
INTRAVENOUS | Status: AC
  Filled 2019-12-01: qty 500000

## 2019-12-01 MED ORDER — EPINEPHRINE PF 1 MG/ML IJ SOLN
INTRAMUSCULAR | Status: AC
  Filled 2019-12-01: qty 1

## 2019-12-01 MED ORDER — ESCITALOPRAM OXALATE 20 MG PO TABS: 20.0000 mg | ORAL_TABLET | Freq: Every day | ORAL | Status: DC

## 2019-12-01 MED ORDER — ROCURONIUM BROMIDE 10 MG/ML (PF) SYRINGE
PREFILLED_SYRINGE | INTRAVENOUS | Status: AC
  Filled 2019-12-01: qty 10

## 2019-12-01 MED ORDER — ACETAMINOPHEN 500 MG PO TABS
1000.0000 mg | ORAL_TABLET | Freq: Once | ORAL | Status: AC
  Administered 2019-12-01: 1000 mg via ORAL
  Filled 2019-12-01: qty 2

## 2019-12-01 MED ORDER — SPIRONOLACTONE 25 MG PO TABS: 25.0000 mg | ORAL_TABLET | Freq: Every day | ORAL | Status: DC

## 2019-12-01 MED ORDER — 0.9 % SODIUM CHLORIDE (POUR BTL) OPTIME
TOPICAL | Status: DC | PRN
Start: 2019-12-01 — End: 2019-12-01
  Administered 2019-12-01: 1000 mL

## 2019-12-01 MED ORDER — QUETIAPINE FUMARATE 25 MG PO TABS: 25.0000 mg | ORAL_TABLET | Freq: Two times a day (BID) | ORAL | Status: DC

## 2019-12-01 MED ORDER — ORAL CARE MOUTH RINSE: 15.0000 mL | Freq: Once | OROMUCOSAL | Status: AC

## 2019-12-01 MED ORDER — GLYCOPYRROLATE PF 0.2 MG/ML IJ SOSY
PREFILLED_SYRINGE | INTRAMUSCULAR | Status: DC | PRN
Start: 2019-12-01 — End: 2019-12-01
  Administered 2019-12-01: .2 mg via INTRAVENOUS

## 2019-12-01 MED ORDER — GABAPENTIN 100 MG PO CAPS: 100.0000 mg | ORAL_CAPSULE | Freq: Every day | ORAL | Status: DC

## 2019-12-01 MED ORDER — LACTATED RINGERS IV SOLN: INTRAVENOUS | Status: DC

## 2019-12-01 MED ORDER — ONDANSETRON HCL 4 MG/2ML IJ SOLN
INTRAMUSCULAR | Status: AC
  Filled 2019-12-01: qty 2

## 2019-12-01 MED ORDER — PROPOFOL 10 MG/ML IV BOLUS
INTRAVENOUS | Status: DC | PRN
  Administered 2019-12-01: 30 mg via INTRAVENOUS
  Administered 2019-12-01: 40 mg via INTRAVENOUS
  Administered 2019-12-01: 130 mg via INTRAVENOUS

## 2019-12-01 MED ORDER — FENTANYL CITRATE (PF) 100 MCG/2ML IJ SOLN: 25.0000 ug | INTRAMUSCULAR | Status: DC | PRN

## 2019-12-01 MED ORDER — PHENYLEPHRINE HCL-NACL 10-0.9 MG/250ML-% IV SOLN
INTRAVENOUS | Status: DC | PRN
  Administered 2019-12-01: 25 ug/min via INTRAVENOUS

## 2019-12-01 MED ORDER — PROMETHAZINE HCL 25 MG/ML IJ SOLN: 6.2500 mg | INTRAMUSCULAR | Status: DC | PRN

## 2019-12-01 MED ORDER — DONEPEZIL HCL 10 MG PO TABS: 10.0000 mg | ORAL_TABLET | Freq: Every day | ORAL | Status: DC

## 2019-12-01 MED ORDER — BACITRACIN ZINC 500 UNIT/GM EX OINT
TOPICAL_OINTMENT | CUTANEOUS | Status: DC | PRN
  Administered 2019-12-01: 1 via TOPICAL

## 2019-12-01 MED ORDER — LIDOCAINE 2% (20 MG/ML) 5 ML SYRINGE
INTRAMUSCULAR | Status: DC | PRN
  Administered 2019-12-01: 80 mg via INTRAVENOUS

## 2019-12-01 MED ORDER — EPHEDRINE SULFATE-NACL 50-0.9 MG/10ML-% IV SOSY
PREFILLED_SYRINGE | INTRAVENOUS | Status: DC | PRN
  Administered 2019-12-01 (×2): 10 mg via INTRAVENOUS
  Administered 2019-12-01 (×2): 15 mg via INTRAVENOUS

## 2019-12-01 MED ORDER — SUGAMMADEX SODIUM 200 MG/2ML IV SOLN
INTRAVENOUS | Status: DC | PRN
  Administered 2019-12-01: 200 mg via INTRAVENOUS

## 2019-12-01 MED ORDER — CEFAZOLIN SODIUM-DEXTROSE 2-4 GM/100ML-% IV SOLN
2.0000 g | INTRAVENOUS | Status: AC
  Administered 2019-12-01: 2 g via INTRAVENOUS
  Filled 2019-12-01: qty 100

## 2019-12-01 MED ORDER — DEXAMETHASONE SODIUM PHOSPHATE 10 MG/ML IJ SOLN
INTRAMUSCULAR | Status: AC
  Filled 2019-12-01: qty 1

## 2019-12-01 MED ORDER — CHLORHEXIDINE GLUCONATE 0.12 % MT SOLN
15.0000 mL | Freq: Once | OROMUCOSAL | Status: AC
  Administered 2019-12-01: 15 mL via OROMUCOSAL
  Filled 2019-12-01: qty 15

## 2019-12-01 MED ORDER — HYDROCODONE-ACETAMINOPHEN 5-325 MG PO TABS: 1.0000 | ORAL_TABLET | Freq: Four times a day (QID) | ORAL | Status: DC | PRN

## 2019-12-01 MED ORDER — HEMOSTATIC AGENTS (NO CHARGE) OPTIME
TOPICAL | Status: DC | PRN
  Administered 2019-12-01: 1 via TOPICAL

## 2019-12-01 MED ORDER — ONDANSETRON HCL 4 MG/2ML IJ SOLN
INTRAMUSCULAR | Status: DC | PRN
  Administered 2019-12-01: 4 mg via INTRAVENOUS

## 2019-12-01 SURGICAL SUPPLY — 53 items
APL SWBSTK 6 STRL LF DISP (MISCELLANEOUS) ×2
APPLICATOR COTTON TIP 6 STRL (MISCELLANEOUS) ×2 IMPLANT
APPLICATOR COTTON TIP 6IN STRL (MISCELLANEOUS) ×4
BALL CTTN LRG ABS STRL LF (GAUZE/BANDAGES/DRESSINGS) ×1
BLADE SURG 15 STRL LF DISP TIS (BLADE) ×1 IMPLANT
BLADE SURG 15 STRL SS (BLADE) ×2
BNDG EYE OVAL (GAUZE/BANDAGES/DRESSINGS) IMPLANT
CANISTER SUCT 3000ML PPV (MISCELLANEOUS) IMPLANT
COTTONBALL LRG STERILE PKG (GAUZE/BANDAGES/DRESSINGS) ×2 IMPLANT
COVER SURGICAL LIGHT HANDLE (MISCELLANEOUS) ×2 IMPLANT
COVER WAND RF STERILE (DRAPES) ×2 IMPLANT
DECANTER SPIKE VIAL GLASS SM (MISCELLANEOUS) ×2 IMPLANT
ELECT CAUTERY BLADE 6.4 (BLADE) ×2 IMPLANT
ELECT NEEDLE TIP 2.8 STRL (NEEDLE) ×2 IMPLANT
ELECT REM PT RETURN 9FT ADLT (ELECTROSURGICAL) ×2
ELECTRODE REM PT RTRN 9FT ADLT (ELECTROSURGICAL) ×1 IMPLANT
GAUZE 4X4 16PLY RFD (DISPOSABLE) ×4 IMPLANT
GAUZE SPONGE 4X4 12PLY STRL (GAUZE/BANDAGES/DRESSINGS) ×2 IMPLANT
GAUZE XEROFORM 1X8 LF (GAUZE/BANDAGES/DRESSINGS) ×2 IMPLANT
GAUZE XEROFORM 5X9 LF (GAUZE/BANDAGES/DRESSINGS) IMPLANT
GLOVE BIO SURGEON STRL SZ 6.5 (GLOVE) ×2 IMPLANT
GLOVE BIOGEL M STRL SZ7.5 (GLOVE) ×2 IMPLANT
GLOVE BIOGEL PI IND STRL 7.5 (GLOVE) ×1 IMPLANT
GLOVE BIOGEL PI IND STRL 8 (GLOVE) ×1 IMPLANT
GLOVE BIOGEL PI INDICATOR 7.5 (GLOVE) ×1
GLOVE BIOGEL PI INDICATOR 8 (GLOVE) ×1
GOWN STRL REUS W/ TWL LRG LVL3 (GOWN DISPOSABLE) ×2 IMPLANT
GOWN STRL REUS W/TWL LRG LVL3 (GOWN DISPOSABLE) ×4
HEMOSTAT SURGICEL 2X14 (HEMOSTASIS) ×2 IMPLANT
KIT BASIN OR (CUSTOM PROCEDURE TRAY) ×2 IMPLANT
NEEDLE HYPO 25GX1X1/2 BEV (NEEDLE) ×2 IMPLANT
NEEDLE PRECISIONGLIDE 27X1.5 (NEEDLE) ×2 IMPLANT
NS IRRIG 1000ML POUR BTL (IV SOLUTION) ×2 IMPLANT
PACK EENT II TURBAN DRAPE (CUSTOM PROCEDURE TRAY) ×2 IMPLANT
PENCIL BUTTON HOLSTER BLD 10FT (ELECTRODE) ×2 IMPLANT
PENCIL SMOKE EVACUATOR (MISCELLANEOUS) ×2 IMPLANT
SPONGE LAP 18X18 RF (DISPOSABLE) ×2 IMPLANT
STAPLER VISISTAT 35W (STAPLE) ×2 IMPLANT
STOCKINETTE 6  STRL (DRAPES) ×2
STOCKINETTE 6 STRL (DRAPES) ×1 IMPLANT
SUCTION FRAZIER HANDLE 10FR (MISCELLANEOUS)
SUCTION TUBE FRAZIER 10FR DISP (MISCELLANEOUS) IMPLANT
SUT ETHILON 4 0 PS 2 18 (SUTURE) IMPLANT
SUT ETHILON 5 0 P 3 18 (SUTURE)
SUT ETHILON 5 0 PS 2 18 (SUTURE) IMPLANT
SUT MNCRL AB 4-0 PS2 18 (SUTURE) ×2 IMPLANT
SUT MON AB 5-0 PS2 18 (SUTURE) ×8 IMPLANT
SUT NYLON ETHILON 5-0 P-3 1X18 (SUTURE) IMPLANT
SUT PDS AB 3-0 SH 27 (SUTURE) ×10 IMPLANT
SYR BULB EAR ULCER 3OZ GRN STR (SYRINGE) ×2 IMPLANT
SYR CONTROL 10ML LL (SYRINGE) ×2 IMPLANT
TOWEL GREEN STERILE FF (TOWEL DISPOSABLE) ×4 IMPLANT
TUBE CONNECTING 20X1/4 (TUBING) IMPLANT

## 2019-12-01 NOTE — Progress Notes (Signed)
Orthopedic Tech Progress Note Patient Details:  Kevin Lynch Dec 01, 1932 [id number] Grabbed wrong sticker while in PACU. sorry Patient ID: Kevin Keel., male   DOB: [birth year], 86 y.o.   MRN: [id number]   Kevin Lynch 12/01/2019, 3:41 PM

## 2019-12-01 NOTE — Anesthesia Postprocedure Evaluation (Signed)
Anesthesia Post Note  Patient: Kevin Lynch.  Procedure(s) Performed: Reconstruction of nasal defect with paramedian forehead flap and ear cartilage graft (N/A Nose)     Patient location during evaluation: PACU Anesthesia Type: General Level of consciousness: awake and alert and oriented Pain management: pain level controlled Vital Signs Assessment: post-procedure vital signs reviewed and stable Respiratory status: spontaneous breathing, nonlabored ventilation and respiratory function stable Cardiovascular status: blood pressure returned to baseline Postop Assessment: no apparent nausea or vomiting Anesthetic complications: no   No complications documented.  Last Vitals:  Vitals:   12/01/19 1045 12/01/19 1100  BP: (!) 95/46 (!) 112/49  Pulse: 66 63  Resp: 13 16  Temp:    SpO2: 91% 91%    Last Pain:  Vitals:   12/01/19 1030  TempSrc:   PainSc: 0-No pain                 Brennan Bailey

## 2019-12-01 NOTE — Telephone Encounter (Signed)
Called and spoke with Anne Ng, patient's wife. She reports he had some drainage from under his nostrils, but is otherwise doing well. Provided her with some instructions on wound care, etc. Advised her rx for post-op pain was sent to his pharmacy today.  Recommended calling back with questions or concerns.

## 2019-12-01 NOTE — Op Note (Signed)
Operative Note   DATE OF OPERATION: 12/01/2019  SURGICAL DEPARTMENT: Plastic Surgery  PREOPERATIVE DIAGNOSES:  Nasal Mohs Defect  POSTOPERATIVE DIAGNOSES:  same  PROCEDURE:  1. Surgical preparation for coverage nasal defect 4x3 cm 2. Alar reconstruction with ear cartilage graft 3.  Nasal reconstruction with paramedian forehead flap  SURGEON: Talmadge Coventry, MD  ASSISTANT: Verdie Shire, PA The advanced practice practitioner (APP) assisted throughout the case.  The APP was essential in retraction and counter traction when needed to make the case progress smoothly.  This retraction and assistance made it possible to see the tissue plans for the procedure.  The assistance was needed for blood control, tissue re-approximation and assisted with closure of the incision site.  ANESTHESIA:  General.   COMPLICATIONS: None.   INDICATIONS FOR PROCEDURE:  The patient, Kevin Lynch is a 86 y.o. male born on 11-27-1932, is here for treatment of large full thickness nasal defect with alar collapse. MRN: [id number]  CONSENT:  Informed consent was obtained directly from the patient. Risks, benefits and alternatives were fully discussed. Specific risks including but not limited to bleeding, infection, hematoma, seroma, scarring, pain, contracture, asymmetry, wound healing problems, and need for further surgery were all discussed. The patient did have an ample opportunity to have questions answered to satisfaction.   DESCRIPTION OF PROCEDURE:  The patient was taken to the operating room. SCDs were placed and Ancef antibiotics were given. General anesthesia was administered.  The patient's operative site was prepped and draped in a sterile fashion. A time out was performed and all information was confirmed to be correct.  I started by Doppler in the supratrochlear vessel on the right.  I then followed this superiorly for several centimeters.  I then planned out the required region of the forehead flap  to get to the distal aspect of the defect and then folded over or lining reconstruction.  I then planned out the general location of this in the right upper forehead.  I then injected this area with lidocaine with epinephrine along with the nose and the posterior ear area.  This was given time to work.  I started by harvesting the ear cartilage.  Made an incision in the posterior auricular sulcus with a 15 blade.  The skin was elevated off the cartilage.  The cartilage was incised and harvested without any issues.  Hemostasis was obtained in the ear defect was closed with 5-0 Monocryl sutures.  I then turned my attention to the nasal defect.  15 blade was used to debride the skin circumferentially for about a millimeter.  This gave fresh edges.  Skin was then undermined circumferentially to avoid pincushioning as much as possible.  At this point hemostasis was obtained.  The alar defect was full-thickness in the area of the ala.  There was some exposure of the upper lateral cartilage.  There is no support to the ala.  I then made pockets on either side of the defect that would fit the cartilage graft.  I then made a template of the defect with glove paper and transpose this to the forehead.  Forehead flap was then harvested.  All incisions were made with a 15 blade down to the periosteum.  Flap was then elevated in a supraperiosteal plane with a 15 blade.  Hemostasis was obtained and it was rotated into position reached without any tension.  Forehead was then undermined circumferentially with tenotomy scissors.  It was able to be advanced and closed primarily with a interrupted  3-0 PDS sutures and a running 4-0 Monocryl.  There was a small area where the tension was too tight this was left open.  Forehead flap was then tailored specifically for the defect.  The distal aspect was thinned of all frontalis and the very distal aspect was trimmed to just leave a thin layer of subcutaneous tissue.  It was bleeding  fine and was adequately perfused.  The distal aspect was then sutured to the nasal lining and folded over itself.  Cartilage graft was then inserted for structural support and secured with 5-0 Monocryl sutures.  The remainder of the forehead flap was then inset with interrupted 5-0 Monocryl mattress sutures.  This gave a nice contour.  The pedicle itself is then wrapped with Surgicel for hemostasis.  The remainder the wounds are covered with ointment, Xeroform, 4 x 4's and a head wrap.  The patient tolerated the procedure well.  There were no complications. The patient was allowed to wake from anesthesia, extubated and taken to the recovery room in satisfactory condition.

## 2019-12-01 NOTE — Brief Op Note (Signed)
12/01/2019  10:33 AM  PATIENT:  Kevin Lynch.  86 y.o. male  PRE-OPERATIVE DIAGNOSIS:  Basal cell carcinoma of nose  POST-OPERATIVE DIAGNOSIS:  Basal cell carcinoma of nose  PROCEDURE:  Procedure(s): Reconstruction of nasal defect with paramedian forehead flap and ear cartilage graft (N/A)  SURGEON:  Surgeon(s) and Role:    * Lizett Chowning, Steffanie Dunn, MD - Primary  PHYSICIAN ASSISTANT: Software engineer, PA  ASSISTANTS: none   ANESTHESIA:   general  EBL:  25 mL   BLOOD ADMINISTERED:none  DRAINS: none   LOCAL MEDICATIONS USED:  LIDOCAINE   SPECIMEN:  No Specimen  DISPOSITION OF SPECIMEN:  N/A  COUNTS:  YES  TOURNIQUET:  * No tourniquets in log *  DICTATION: .Dragon Dictation  PLAN OF CARE: Admit for overnight observation  PATIENT DISPOSITION:  PACU - hemodynamically stable.   Delay start of Pharmacological VTE agent (>24hrs) due to surgical blood loss or risk of bleeding: not applicable

## 2019-12-01 NOTE — Discharge Instructions (Addendum)
Activity As tolerated: NO showers for 3 days NO driving No heavy activities  Diet: Regular. Drink plenty of fluids (Water. Avoid sugar/sodas/diet sodas)  Wound Care: Keep dressing clean & dry. Change forehead dressing with xeroform every other day. Xeroform can be purchased on amaz[employer]/medical supply store. Do not apply any dressing/gauze over nose. Apply bacitracin ointment to nose incisions every day.  Apply xeroform behind right ear daily (you have an incision there).  When lying down, keep head elevated on 2-3 pillows or back-rest. Sleep in a reclined position.  Restart blood thinner 5 days after surgery.  Special Instructions: Call Doctor if any unusual problems occur such as pain, excessive bleeding, unrelieved Nausea/vomiting, Fever &/or chills Call with questions  Follow-up appointment: Scheduled for next week.

## 2019-12-01 NOTE — Anesthesia Procedure Notes (Signed)
Procedure Name: Intubation Date/Time: 12/01/2019 7:43 AM Performed by: Alain Marion, CRNA Pre-anesthesia Checklist: Patient identified, Emergency Drugs available, Suction available and Patient being monitored Patient Re-evaluated:Patient Re-evaluated prior to induction Oxygen Delivery Method: Circle System Utilized Preoxygenation: Pre-oxygenation with 100% oxygen Induction Type: IV induction Ventilation: Mask ventilation without difficulty and Oral airway inserted - appropriate to patient size Laryngoscope Size: Sabra Heck and 2 Grade View: Grade I Tube type: Oral Tube size: 7.5 mm Number of attempts: 1 Airway Equipment and Method: Stylet and Oral airway Placement Confirmation: ETT inserted through vocal cords under direct vision,  positive ETCO2 and breath sounds checked- equal and bilateral Secured at: 22 cm Tube secured with: Tape Dental Injury: Teeth and Oropharynx as per pre-operative assessment

## 2019-12-01 NOTE — Transfer of Care (Signed)
Immediate Anesthesia Transfer of Care Note  Patient: Kevin Lynch.  Procedure(s) Performed: Reconstruction of nasal defect with paramedian forehead flap and ear cartilage graft (N/A Nose)  Patient Location: PACU  Anesthesia Type:General  Level of Consciousness: awake, alert  and oriented  Airway & Oxygen Therapy: Patient Spontanous Breathing and Patient connected to face mask oxygen  Post-op Assessment: Report given to RN and Post -op Vital signs reviewed and stable  Post vital signs: Reviewed and stable  Last Vitals:  Vitals Value Taken Time  BP 114/42 12/01/19 1001  Temp 37 C 12/01/19 1000  Pulse 71 12/01/19 1008  Resp 10 12/01/19 1008  SpO2 98 % 12/01/19 1008  Vitals shown include unvalidated device data.  Last Pain:  Vitals:   12/01/19 0629  TempSrc:   PainSc: Asleep      Patients Stated Pain Goal: 3 (27/61/84 8592)  Complications: No complications documented.

## 2019-12-01 NOTE — Telephone Encounter (Signed)
Patient's wife called to see if he could be called in some more hydrocodone because he is in a lot of pain after surgery. Wife also said that there is blood on the bandage under his nose and it seems as if the blood is spreading. Should she change this or wait until tomorrow. Please call to advise how to care for area under nose and about pain medication. [phone]

## 2019-12-01 NOTE — Interval H&P Note (Signed)
History and Physical Interval Note:  12/01/2019 7:27 AM  Kevin Lynch.  has presented today for surgery, with the diagnosis of Basal cell carcinoma of nose.  The various methods of treatment have been discussed with the patient and family. After consideration of risks, benefits and other options for treatment, the patient has consented to  Procedure(s): Reconstruction of nasal defect with paramedian forehead flap and ear cartilage graft (N/A) as a surgical intervention.  The patient's history has been reviewed, patient examined, no change in status, stable for surgery.  I have reviewed the patient's chart and labs.  Questions were answered to the patient's satisfaction.     Cindra Presume

## 2019-12-02 ENCOUNTER — Ambulatory Visit: Payer: Medicare Other | Admitting: Plastic Surgery

## 2019-12-02 ENCOUNTER — Encounter (HOSPITAL_COMMUNITY): Payer: Self-pay | Admitting: Plastic Surgery

## 2019-12-02 VITALS — BP 102/64 | HR 75 | Temp 97.6°F

## 2019-12-02 DIAGNOSIS — C44311 Basal cell carcinoma of skin of nose: Secondary | ICD-10-CM

## 2019-12-02 DIAGNOSIS — Z945 Skin transplant status: Secondary | ICD-10-CM

## 2019-12-02 NOTE — Progress Notes (Signed)
Patient is an 86 year old male who underwent reconstruction of nasal defect with paramedian forehead flap and ear cartilage graft on 12/01/2019 with Dr. Claudia Desanctis. He had basal cell carcinoma of the nose and had it removed by Mohs procedure.  Today he presents with his wife for follow-up for bleeding and assistance with his bandage. Removed patient's bandages and cleaned up the dried blood. Incision on the back of his right ear is healing very nicely, C/D/I. Small amount of bruising and swelling present. Forehead incision has a small area in the center where tension was greater and was left open at time of surgery, remainder of the incision is intact, C/D/I. Forehead flap and graft on nose is in place. Surgicel is present on the backside of the exposed graft. Graft over wound is in place, incision is intact. Graft is slightly dusky in color but expected at this time. There is clotted blood/scab blocking the right naris, this was carefully cleaned away. Small amount of of oozing drainage observed from right nares and skin flap/graft.  Incision behind right ear and on forehead were covered with Xeroform and gauze and secured with tape. Skin flap/graft were covered with gauze and tape. May change dressings as needed. Follow postop dressing change directions for home care. Keep current follow-up appointment for next week. Call office with any questions/concerns.  [place] was signed into law in 2016 which includes the topic of electronic health records.  This provides immediate access to information in MyChart.  This includes consultation notes, operative notes, office notes, lab results and pathology reports.  If you have any questions about what you read please let us know at your next visit or call us at the office.  We are right here with you.

## 2019-12-03 ENCOUNTER — Ambulatory Visit: Payer: Medicare Other | Admitting: Oncology

## 2019-12-08 ENCOUNTER — Ambulatory Visit (INDEPENDENT_AMBULATORY_CARE_PROVIDER_SITE_OTHER): Payer: Medicare Other | Admitting: Plastic Surgery

## 2019-12-08 ENCOUNTER — Encounter: Payer: Self-pay | Admitting: Plastic Surgery

## 2019-12-08 ENCOUNTER — Other Ambulatory Visit: Payer: Self-pay

## 2019-12-08 VITALS — BP 130/73 | HR 95 | Temp 97.7°F

## 2019-12-08 DIAGNOSIS — C44311 Basal cell carcinoma of skin of nose: Secondary | ICD-10-CM

## 2019-12-08 NOTE — Progress Notes (Signed)
Patient presents about 1 week out from a large forehead flap for nasal reconstruction.  He is overall doing well.  There is minimal drainage at this point.  On exam everything looks really good the flap is viable and healthy with well approximated edges.  The right nostril is patent albeit the part of the flap that was folded over for lining coverage is a bit swollen leaving a fairly small aperture there.  The structural integrity of the cartilage graft feels good.  The forehead is healing fine.  Incision behind the ear is also healing fine.  We will plan to arrange a division and inset in the office after another 2 weeks or so.  All the questions were answered.

## 2019-12-11 NOTE — Discharge Summary (Signed)
Patient was planning to be admitted for observation. He decided to go home with family for post-op care.

## 2019-12-22 ENCOUNTER — Ambulatory Visit: Payer: Medicare Other | Admitting: Plastic Surgery

## 2019-12-22 ENCOUNTER — Other Ambulatory Visit: Payer: Self-pay

## 2019-12-22 ENCOUNTER — Encounter: Payer: Self-pay | Admitting: Plastic Surgery

## 2019-12-22 VITALS — BP 118/75 | HR 97 | Temp 98.1°F

## 2019-12-22 DIAGNOSIS — C44311 Basal cell carcinoma of skin of nose: Secondary | ICD-10-CM

## 2019-12-22 NOTE — Progress Notes (Signed)
Operative Note   DATE OF OPERATION: 12/22/2019  LOCATION:    SURGICAL DEPARTMENT: Plastic Surgery  PREOPERATIVE DIAGNOSES: Forehead flap nasal reconstruction  POSTOPERATIVE DIAGNOSES:  same  PROCEDURE:  1. Division and inset of paramedian forehead flap to the nose  SURGEON: Talmadge Coventry, MD  ANESTHESIA:  Local  COMPLICATIONS: None.   INDICATIONS FOR PROCEDURE:  The patient, Kevin Lynch is a 86 y.o. male born on 1932/12/21, is here for treatment of forehead flap reconstruction status post the first stage.  He is ready for division and inset. MRN: [id number]  CONSENT:  Informed consent was obtained directly from the patient. Risks, benefits and alternatives were fully discussed. Specific risks including but not limited to bleeding, infection, hematoma, seroma, scarring, pain, infection, wound healing problems, and need for further surgery were all discussed. The patient did have an ample opportunity to have questions answered to satisfaction.   DESCRIPTION OF PROCEDURE:  Local anesthesia was administered. The patient's operative site was prepped and draped in a sterile fashion. A time out was performed and all information was confirmed to be correct.  I started by dividing the pedicle proximally.  It bled readily from both ends.  I then carefully recreated the supratrochlear area and debrided the flap as necessary.  A small advancement was performed to improve the alignment of the medial eyebrow and this was all secured with 5-0 Monocryl sutures.  I then turned my attention to the nose.  The flap was divided and thinned out superiorly.  This was then inset with 5-0 Monocryl sutures.  He tolerated this well.  The patient tolerated the procedure well.  There were no complications.

## 2019-12-25 DIAGNOSIS — R002 Palpitations: Secondary | ICD-10-CM | POA: Diagnosis not present

## 2019-12-26 ENCOUNTER — Ambulatory Visit (INDEPENDENT_AMBULATORY_CARE_PROVIDER_SITE_OTHER): Payer: Medicare Other

## 2019-12-26 ENCOUNTER — Other Ambulatory Visit: Payer: Self-pay

## 2019-12-26 DIAGNOSIS — R002 Palpitations: Secondary | ICD-10-CM

## 2019-12-26 NOTE — Progress Notes (Signed)
.  zio

## 2019-12-29 ENCOUNTER — Ambulatory Visit: Payer: Medicare Other | Admitting: Plastic Surgery

## 2019-12-29 ENCOUNTER — Telehealth: Payer: Self-pay

## 2019-12-29 NOTE — Telephone Encounter (Signed)
Left message on patients voicemail to please return our call.   

## 2019-12-29 NOTE — Telephone Encounter (Signed)
-----   Message from Richardo Priest, MD sent at 12/29/2019  7:46 AM EDT ----- Good result no changes

## 2019-12-30 ENCOUNTER — Telehealth: Payer: Self-pay

## 2019-12-30 NOTE — Telephone Encounter (Signed)
-----   Message from Richardo Priest, MD sent at 12/29/2019  7:46 AM EDT ----- Good result no changes

## 2019-12-30 NOTE — Telephone Encounter (Signed)
Spoke with patients daughter regarding results and recommendation.  Sher verbalizes understanding and is agreeable to plan of care. Advised patient to call back with any issues or concerns.

## 2020-01-08 ENCOUNTER — Ambulatory Visit: Payer: Medicare Other | Admitting: Plastic Surgery

## 2020-01-08 ENCOUNTER — Other Ambulatory Visit: Payer: Self-pay

## 2020-01-08 ENCOUNTER — Encounter: Payer: Self-pay | Admitting: Plastic Surgery

## 2020-01-08 VITALS — BP 103/68 | HR 83 | Temp 97.9°F

## 2020-01-08 DIAGNOSIS — C44311 Basal cell carcinoma of skin of nose: Secondary | ICD-10-CM

## 2020-01-08 NOTE — Progress Notes (Signed)
Patient presents postop from division and inset of a forehead flap for nasal reconstruction.  He feels like overall he is doing well and that his breathing has improved quite a bit.  On examination everything is healed nicely.  The flap is totally viable.  His forehead incision is totally healed at this point.  Any remaining sutures were removed.  In terms of the result the flap is a bit bulky on the inferior aspect but otherwise looks to be doing well in terms of contour.  We will plan to give this some more time and I will see him again in 5 months to discuss any potential revision procedures.  He is currently satisfied and we will plan to see him at that time.

## 2020-01-12 ENCOUNTER — Telehealth: Payer: Self-pay | Admitting: Cardiology

## 2020-01-12 NOTE — Telephone Encounter (Signed)
New Message   Patients daughter is calling because he has a appointment on 9/16 and they need to know if they need to keep the appt or not. Please call to discuss.

## 2020-01-12 NOTE — Telephone Encounter (Signed)
Spoke to patients daughter just now and let her know that he does need to be seen on the 16th. She verbalizes understanding and thanks me for the call back.

## 2020-01-12 NOTE — Telephone Encounter (Signed)
Yes

## 2020-01-14 DIAGNOSIS — L570 Actinic keratosis: Secondary | ICD-10-CM | POA: Insufficient documentation

## 2020-01-14 DIAGNOSIS — K5792 Diverticulitis of intestine, part unspecified, without perforation or abscess without bleeding: Secondary | ICD-10-CM | POA: Insufficient documentation

## 2020-01-14 DIAGNOSIS — Z973 Presence of spectacles and contact lenses: Secondary | ICD-10-CM | POA: Insufficient documentation

## 2020-01-14 DIAGNOSIS — E786 Lipoprotein deficiency: Secondary | ICD-10-CM | POA: Insufficient documentation

## 2020-01-14 DIAGNOSIS — R739 Hyperglycemia, unspecified: Secondary | ICD-10-CM | POA: Insufficient documentation

## 2020-01-14 DIAGNOSIS — D649 Anemia, unspecified: Secondary | ICD-10-CM | POA: Insufficient documentation

## 2020-01-14 DIAGNOSIS — I5030 Unspecified diastolic (congestive) heart failure: Secondary | ICD-10-CM | POA: Insufficient documentation

## 2020-01-14 DIAGNOSIS — E785 Hyperlipidemia, unspecified: Secondary | ICD-10-CM | POA: Insufficient documentation

## 2020-01-14 DIAGNOSIS — I10 Essential (primary) hypertension: Secondary | ICD-10-CM | POA: Insufficient documentation

## 2020-01-14 DIAGNOSIS — M1990 Unspecified osteoarthritis, unspecified site: Secondary | ICD-10-CM | POA: Insufficient documentation

## 2020-01-14 DIAGNOSIS — R7303 Prediabetes: Secondary | ICD-10-CM | POA: Insufficient documentation

## 2020-01-14 DIAGNOSIS — Z9289 Personal history of other medical treatment: Secondary | ICD-10-CM | POA: Insufficient documentation

## 2020-01-14 DIAGNOSIS — J189 Pneumonia, unspecified organism: Secondary | ICD-10-CM | POA: Insufficient documentation

## 2020-01-14 DIAGNOSIS — G2581 Restless legs syndrome: Secondary | ICD-10-CM | POA: Insufficient documentation

## 2020-01-14 DIAGNOSIS — F0390 Unspecified dementia without behavioral disturbance: Secondary | ICD-10-CM | POA: Insufficient documentation

## 2020-01-14 DIAGNOSIS — H269 Unspecified cataract: Secondary | ICD-10-CM | POA: Insufficient documentation

## 2020-01-14 DIAGNOSIS — Z8673 Personal history of transient ischemic attack (TIA), and cerebral infarction without residual deficits: Secondary | ICD-10-CM | POA: Insufficient documentation

## 2020-01-14 DIAGNOSIS — Z87891 Personal history of nicotine dependence: Secondary | ICD-10-CM | POA: Insufficient documentation

## 2020-01-14 DIAGNOSIS — K259 Gastric ulcer, unspecified as acute or chronic, without hemorrhage or perforation: Secondary | ICD-10-CM | POA: Insufficient documentation

## 2020-01-14 DIAGNOSIS — F32A Depression, unspecified: Secondary | ICD-10-CM | POA: Insufficient documentation

## 2020-01-15 ENCOUNTER — Ambulatory Visit: Payer: Medicare Other | Admitting: Cardiology

## 2020-01-15 ENCOUNTER — Encounter: Payer: Self-pay | Admitting: Cardiology

## 2020-01-15 ENCOUNTER — Other Ambulatory Visit: Payer: Self-pay

## 2020-01-15 VITALS — BP 116/74 | HR 88 | Ht 63.0 in | Wt 171.0 lb

## 2020-01-15 DIAGNOSIS — R001 Bradycardia, unspecified: Secondary | ICD-10-CM

## 2020-01-15 DIAGNOSIS — I119 Hypertensive heart disease without heart failure: Secondary | ICD-10-CM | POA: Diagnosis not present

## 2020-01-15 DIAGNOSIS — I5032 Chronic diastolic (congestive) heart failure: Secondary | ICD-10-CM | POA: Diagnosis not present

## 2020-01-15 DIAGNOSIS — Z961 Presence of intraocular lens: Secondary | ICD-10-CM | POA: Diagnosis not present

## 2020-01-15 DIAGNOSIS — H35371 Puckering of macula, right eye: Secondary | ICD-10-CM | POA: Diagnosis not present

## 2020-01-15 DIAGNOSIS — H35372 Puckering of macula, left eye: Secondary | ICD-10-CM | POA: Diagnosis not present

## 2020-01-15 NOTE — Progress Notes (Signed)
Cardiology Office Note:    Date:  01/15/2020   ID:  Kevin Keel., DOB 1933-01-18, MRN [id number]  PCP:  Renaldo Reel, PA  Cardiologist:  Shirlee More, MD    Referring MD: Renaldo Reel, PA    ASSESSMENT:    1. Hypertensive heart disease, unspecified whether heart failure present   2. Chronic diastolic congestive heart failure ([place])   3. Bradycardia    PLAN:    In order of problems listed above:  1. Stable continue current guideline directed therapy with a minimum dose beta-blocker and ARB and distal diuretic heart failure is compensated 2. Stable tolerating his Aricept and Namenda and minimal dose of beta-blocker with his atrial arrhythmia   Next appointment: As needed   Medication Adjustments/Labs and Tests Ordered: Current medicines are reviewed at length with the patient today.  Concerns regarding medicines are outlined above.  No orders of the defined types were placed in this encounter.  No orders of the defined types were placed in this encounter.   Chief Complaint  Patient presents with  . Follow-up  . Congestive Heart Failure  . Hypertension    History of Present Illness:    Kevin Lynch. is a 86 y.o. male with a hx of diastolic heart failure with hypertensive heart disease, carotid artery disease interstitial lung disease last seen by me prior to extensive ENT surgery for facial melanoma with reconstruction 11/27/2019.  I felt that he is optimized and that his cardiac problems including heart failure and stable enlargement of the ascending aorta did not need further evaluation.. Compliance with diet, lifestyle and medications: Yes  He did well with his extensive surgery except for some early preoperative bleeding.  At times his daughter gets blood pressures less than 100 and will start a strategy where she holds his ARB on any of those days.  No lightheadedness palpitation chest pain edema shortness of breath. Past Medical History:  Diagnosis Date    . Actinic keratosis   . Anemia   . Arthritis   . Ataxic gait 06/09/2015  . Basal cell carcinoma    skin - chest, back and face  . BPH (benign prostatic hyperplasia) 06/09/2015  . Cataracts, bilateral    removed by surgery  . Cerebral vascular disease 06/09/2015  . Chronic diarrhea 01/07/2018  . Chronic gastric ulcer 06/10/2015  . CKD (chronic kidney disease) 02/01/2018  . Claudication, intermittent ([place]) 06/09/2015  . COPD (chronic obstructive pulmonary disease) ([place])   . COPD (chronic obstructive pulmonary disease) ([place]) 06/09/2015  . Dementia ([place])   . Depression   . Diastolic CHF ([place]) 10/04/4401   Procedure narrative: Transthoracic echocardiography. Image   quality was adequate. Intravenous contrast (Definity) was   administered. - Left ventricle: The cavity size was normal. There was moderate   focal basal and mild concentric hypertrophy. Systolic function   was normal. The estimated ejection fraction was in the range of   60% to 65%. Wall motion was normal; there were no regional wall   m  . Diastolic heart failure ([place])   . Diverticulitis   . Diverticulitis   . Enlarged thoracic aorta ([place]) 01/31/2018  . Former smoker    quit 1983-smoked 30 yrs  . Gastric ulcer    from 06/06/15: ULCER/GASTRITIS/ANEMIA:  . Gastroesophageal reflux disease with esophagitis 06/10/2015  . HDL lipoprotein deficiency   . History of blood transfusion   . History of stroke   . Hyperglycemia   . Hyperlipidemia  no meds  . Hypertension   . Hypertensive heart disease 06/09/2015  . Lower extremity edema 06/09/2015  . Male erectile dysfunction, unspecified 06/09/2015  . Melanoma ([place])    left side of face  . Mohs defect 12/01/2019  . Open wound of face 11/10/2019  . Physical deconditioning 07/04/2017  . Pneumonia    x 1  . Pre-diabetes    does not check blood sugar  . Prediabetes   . Recurrent major depressive disorder, in full remission ([place]) 06/09/2015  . Reduced libido 06/09/2015  . Restless leg syndrome   . Simple  chronic bronchitis ([place]) 07/04/2017   IgE >> 55 on 06/26/2017   . Stroke ([place]) 2012  . Vascular dementia ([place]) 06/09/2015  . Vitamin B 12 deficiency 06/09/2015  . Vitamin D deficiency 01/07/2018  . Wears glasses     Past Surgical History:  Procedure Laterality Date  . ADJACENT TISSUE TRANSFER/TISSUE REARRANGEMENT N/A 12/01/2019   Procedure: Reconstruction of nasal defect with paramedian forehead flap and ear cartilage graft;  Surgeon: Cindra Presume, MD;  Location: [place];  Service: Plastics;  Laterality: N/A;  . CATARACT EXTRACTION    . COLONOSCOPY    . DRUG INDUCED ENDOSCOPY    . HEMORROIDECTOMY    . LESION EXCISION WITH COMPLEX REPAIR Left 08/19/2019   Procedure: LEFT FACIAL MELANOMA EXCISION WITH COMPLEX REPAIR;  Surgeon: Izora Gala, MD;  Location: [facility];  Service: ENT;  Laterality: Left;  . LYMPH NODE BIOPSY Left 08/19/2019   Procedure: LEFT LEVEL THREE SENTINEL LYMPH NODE DISSECTION/BIOPSY;  Surgeon: Izora Gala, MD;  Location: [facility];  Service: ENT;  Laterality: Left;  Marland Kitchen MELANOMA EXCISION    . NASAL SEPTUM SURGERY    . PAROTIDECTOMY Left 08/19/2019   LEFT SUPERFICIAL PAROTIDECTOMY, with facial nerve dissection  . PAROTIDECTOMY Left 08/19/2019   Procedure: LEFT PAROTIDECTOMY;  Surgeon: Izora Gala, MD;  Location: [facility];  Service: ENT;  Laterality: Left;  . PROSTATE SURGERY    . SENTINEL LYMPH NODE BIOPSY  08/19/2019   LEFT LEVEL THREE SENTINEL LYMPH NODE DISSECTION/BIOPSY  . SKIN FULL THICKNESS GRAFT Left 08/19/2019   Procedure: SKIN GRAFT FULL THICKNESS;  Surgeon: Izora Gala, MD;  Location: [facility];  Service: ENT;  Laterality: Left;  Marland Kitchen VASECTOMY      Current Medications: Current Meds  Medication Sig  . ACETAMINOPHEN PO Take by mouth.  Marland Kitchen albuterol (PROVENTIL) (2.5 MG/3ML) 0.083% nebulizer solution Take 2.5 mg by nebulization every 6 (six) hours as needed for wheezing or shortness of breath.  Marland Kitchen BREO ELLIPTA 100-25 MCG/INH AEPB Inhale 1 puff into the lungs daily.   . clopidogrel  (PLAVIX) 75 MG tablet Take 75 mg by mouth daily.  . Coenzyme Q10 (COQ-10) 100 MG CAPS Take 100 mg by mouth daily.   . cyanocobalamin (,VITAMIN B-12,) 1000 MCG/ML injection Inject 1,000 mcg into the muscle every 30 (thirty) days.   Marland Kitchen donepezil (ARICEPT) 10 MG tablet Take 10 mg by mouth at bedtime.  . ergocalciferol (VITAMIN D2) 1.25 MG (50000 UT) capsule Take 50,000 Units by mouth once a week.   . escitalopram (LEXAPRO) 20 MG tablet Take 20 mg by mouth daily.   Marland Kitchen gabapentin (NEURONTIN) 100 MG capsule Take 100 mg by mouth at bedtime.  Marland Kitchen HYDROcodone-acetaminophen (NORCO) 7.5-325 MG tablet Take 1 tablet by mouth every 6 (six) hours as needed for moderate pain.  Marland Kitchen levocetirizine (XYZAL) 5 MG tablet Take 5 mg by mouth every morning.   . Loperamide HCl (IMODIUM PO)  Take 2 mg by mouth daily as needed (upset stomach).   . memantine (NAMENDA) 10 MG tablet Take 10 mg by mouth 2 (two) times daily.   . metoprolol succinate (TOPROL-XL) 25 MG 24 hr tablet Take 12.5 mg by mouth daily.  . Omega-3 Fatty Acids (FISH OIL PO) Take by mouth daily.  . QUEtiapine (SEROQUEL) 25 MG tablet Take 25 mg by mouth 2 (two) times daily.  Marland Kitchen spironolactone (ALDACTONE) 25 MG tablet Take 25 mg by mouth daily.  Marland Kitchen terazosin (HYTRIN) 5 MG capsule Take 5 mg by mouth at bedtime.  . valsartan (DIOVAN) 80 MG tablet Take 80 mg by mouth daily.      Allergies:   Shellfish allergy, Codeine, and Lisinopril   Social History   Socioeconomic History  . Marital status: Married    Spouse name: Not on file  . Number of children: Not on file  . Years of education: Not on file  . Highest education level: Not on file  Occupational History  . Not on file  Tobacco Use  . Smoking status: Former Smoker    Packs/day: 0.50    Years: 30.00    Pack years: 15.00    Types: Cigarettes    Quit date: 05/01/1981    Years since quitting: 38.7  . Smokeless tobacco: Former Systems developer    Types: Secondary school teacher  . Vaping Use: Never used  Substance and  Sexual Activity  . Alcohol use: Not Currently  . Drug use: Never  . Sexual activity: Not on file  Other Topics Concern  . Not on file  Social History Narrative  . Not on file   Social Determinants of Health   Financial Resource Strain:   . Difficulty of Paying Living Expenses: Not on file  Food Insecurity:   . Worried About Charity fundraiser in the Last Year: Not on file  . Ran Out of Food in the Last Year: Not on file  Transportation Needs:   . Lack of Transportation (Medical): Not on file  . Lack of Transportation (Non-Medical): Not on file  Physical Activity:   . Days of Exercise per Week: Not on file  . Minutes of Exercise per Session: Not on file  Stress:   . Feeling of Stress : Not on file  Social Connections:   . Frequency of Communication with Friends and Family: Not on file  . Frequency of Social Gatherings with Friends and Family: Not on file  . Attends Religious Services: Not on file  . Active Member of Clubs or Organizations: Not on file  . Attends Archivist Meetings: Not on file  . Marital Status: Not on file     Family History: The patient's family history includes Asthma in his brother; CVA in his mother; Heart attack in his brother, father, and mother; Hyperlipidemia in his brother; Hypertension in his brother, father, and mother; Stroke in his father and mother. ROS:   Please see the history of present illness.    All other systems reviewed and are negative.  EKGs/Labs/Other Studies Reviewed:    The following studies were reviewed today:    Recent Labs: 12/01/2019: BUN 36; Creatinine, Ser 1.62; Hemoglobin 13.0; Platelets 232; Potassium 3.4; Sodium 129  Recent Lipid Panel No results found for: CHOL, TRIG, HDL, CHOLHDL, VLDL, LDLCALC, LDLDIRECT  Physical Exam:    VS:  BP 116/74   Pulse 88   Ht 5\' 3"  (1.6 m)   Wt 171 lb (77.6 kg)  SpO2 95%   BMI 30.29 kg/m     Wt Readings from Last 3 Encounters:  01/15/20 171 lb (77.6 kg)    12/01/19 171 lb (77.6 kg)  11/27/19 172 lb 9.6 oz (78.3 kg)     GEN:  Well nourished, well developed in no acute distress HEENT: Normal NECK: No JVD; No carotid bruits LYMPHATICS: No lymphadenopathy CARDIAC: RRR, no murmurs, rubs, gallops RESPIRATORY:  Clear to auscultation without rales, wheezing or rhonchi  ABDOMEN: Soft, non-tender, non-distended MUSCULOSKELETAL:  No edema; No deformity  SKIN: Warm and dry NEUROLOGIC:  Alert and oriented x 3 PSYCHIATRIC:  Normal affect    Signed, Shirlee More, MD  01/15/2020 1:58 PM    [facility]

## 2020-01-15 NOTE — Patient Instructions (Signed)
Medication Instructions:  Your physician recommends that you continue on your current medications as directed. Please refer to the Current Medication list given to you today.  If your systolic (top) bloor pressure is less than 110 do not take your valsartan *If you need a refill on your cardiac medications before your next appointment, please call your pharmacy*   Lab Work: None If you have labs (blood work) drawn today and your tests are completely normal, you will receive your results only by: Marland Kitchen MyChart Message (if you have MyChart) OR . A paper copy in the mail If you have any lab test that is abnormal or we need to change your treatment, we will call you to review the results.   Testing/Procedures: None   Follow-Up: At [facility], you and your health needs are our priority.  As part of our continuing mission to provide you with exceptional heart care, we have created designated Provider Care Teams.  These Care Teams include your primary Cardiologist (physician) and Advanced Practice Providers (APPs -  Physician Assistants and Nurse Practitioners) who all work together to provide you with the care you need, when you need it.  We recommend signing up for the patient portal called "MyChart".  Sign up information is provided on this After Visit Summary.  MyChart is used to connect with patients for Virtual Visits (Telemedicine).  Patients are able to view lab/test results, encounter notes, upcoming appointments, etc.  Non-urgent messages can be sent to your provider as well.   To learn more about what you can do with MyChart, go to [email or web address].    Your next appointment:   As needed  The format for your next appointment:   In Person  Provider:   Shirlee More, MD   Other Instructions

## 2020-01-27 ENCOUNTER — Other Ambulatory Visit: Payer: Medicare Other

## 2020-01-27 ENCOUNTER — Inpatient Hospital Stay: Payer: Medicare Other | Attending: Oncology

## 2020-01-27 ENCOUNTER — Ambulatory Visit (HOSPITAL_COMMUNITY): Payer: Medicare Other

## 2020-02-03 ENCOUNTER — Ambulatory Visit: Payer: Medicare Other | Admitting: Oncology

## 2020-02-03 ENCOUNTER — Inpatient Hospital Stay: Payer: Medicare Other | Admitting: Oncology

## 2020-02-16 DIAGNOSIS — C4330 Malignant melanoma of unspecified part of face: Secondary | ICD-10-CM | POA: Diagnosis not present

## 2020-02-25 DIAGNOSIS — E538 Deficiency of other specified B group vitamins: Secondary | ICD-10-CM | POA: Diagnosis not present

## 2020-02-25 DIAGNOSIS — E782 Mixed hyperlipidemia: Secondary | ICD-10-CM | POA: Diagnosis not present

## 2020-02-25 DIAGNOSIS — I10 Essential (primary) hypertension: Secondary | ICD-10-CM | POA: Diagnosis not present

## 2020-02-25 DIAGNOSIS — I5030 Unspecified diastolic (congestive) heart failure: Secondary | ICD-10-CM | POA: Diagnosis not present

## 2020-02-25 DIAGNOSIS — Z9181 History of falling: Secondary | ICD-10-CM | POA: Diagnosis not present

## 2020-02-25 DIAGNOSIS — Z23 Encounter for immunization: Secondary | ICD-10-CM | POA: Diagnosis not present

## 2020-03-30 ENCOUNTER — Other Ambulatory Visit: Payer: Self-pay | Admitting: Otolaryngology

## 2020-03-30 DIAGNOSIS — C4330 Malignant melanoma of unspecified part of face: Secondary | ICD-10-CM

## 2020-04-15 ENCOUNTER — Other Ambulatory Visit: Payer: Self-pay | Admitting: Otolaryngology

## 2020-04-15 ENCOUNTER — Ambulatory Visit
Admission: RE | Admit: 2020-04-15 | Discharge: 2020-04-15 | Disposition: A | Discharge status: Home / Self Care | Payer: Medicare Other | Source: Ambulatory Visit | Attending: Otolaryngology | Admitting: Otolaryngology

## 2020-04-15 ENCOUNTER — Other Ambulatory Visit: Payer: Medicare Other

## 2020-04-15 DIAGNOSIS — C44319 Basal cell carcinoma of skin of other parts of face: Secondary | ICD-10-CM | POA: Diagnosis not present

## 2020-04-15 DIAGNOSIS — R220 Localized swelling, mass and lump, head: Secondary | ICD-10-CM | POA: Diagnosis not present

## 2020-04-15 DIAGNOSIS — C4330 Malignant melanoma of unspecified part of face: Secondary | ICD-10-CM

## 2020-04-15 DIAGNOSIS — J320 Chronic maxillary sinusitis: Secondary | ICD-10-CM | POA: Diagnosis not present

## 2020-04-15 DIAGNOSIS — J3489 Other specified disorders of nose and nasal sinuses: Secondary | ICD-10-CM | POA: Diagnosis not present

## 2020-04-15 MED ORDER — IOPAMIDOL (ISOVUE-300) INJECTION 61%
60.0000 mL | Freq: Once | INTRAVENOUS | Status: AC | PRN
  Administered 2020-04-15: 60 mL via INTRAVENOUS

## 2020-05-11 DIAGNOSIS — Z85828 Personal history of other malignant neoplasm of skin: Secondary | ICD-10-CM | POA: Diagnosis not present

## 2020-05-11 DIAGNOSIS — D485 Neoplasm of uncertain behavior of skin: Secondary | ICD-10-CM | POA: Diagnosis not present

## 2020-05-11 DIAGNOSIS — C4330 Malignant melanoma of unspecified part of face: Secondary | ICD-10-CM | POA: Diagnosis not present

## 2020-05-11 DIAGNOSIS — Z85820 Personal history of malignant melanoma of skin: Secondary | ICD-10-CM | POA: Diagnosis not present

## 2020-05-11 DIAGNOSIS — D044 Carcinoma in situ of skin of scalp and neck: Secondary | ICD-10-CM | POA: Diagnosis not present

## 2020-05-11 DIAGNOSIS — L905 Scar conditions and fibrosis of skin: Secondary | ICD-10-CM | POA: Diagnosis not present

## 2020-05-13 DIAGNOSIS — Z0000 Encounter for general adult medical examination without abnormal findings: Secondary | ICD-10-CM | POA: Diagnosis not present

## 2020-05-13 DIAGNOSIS — Z9181 History of falling: Secondary | ICD-10-CM | POA: Diagnosis not present

## 2020-05-13 DIAGNOSIS — E785 Hyperlipidemia, unspecified: Secondary | ICD-10-CM | POA: Diagnosis not present

## 2020-06-03 DIAGNOSIS — I10 Essential (primary) hypertension: Secondary | ICD-10-CM | POA: Diagnosis not present

## 2020-06-03 DIAGNOSIS — I700 Atherosclerosis of aorta: Secondary | ICD-10-CM | POA: Diagnosis not present

## 2020-06-03 DIAGNOSIS — S0180XA Unspecified open wound of other part of head, initial encounter: Secondary | ICD-10-CM | POA: Diagnosis not present

## 2020-06-03 DIAGNOSIS — M549 Dorsalgia, unspecified: Secondary | ICD-10-CM | POA: Diagnosis not present

## 2020-06-03 DIAGNOSIS — E782 Mixed hyperlipidemia: Secondary | ICD-10-CM | POA: Diagnosis not present

## 2020-06-03 DIAGNOSIS — I5030 Unspecified diastolic (congestive) heart failure: Secondary | ICD-10-CM | POA: Diagnosis not present

## 2020-06-03 DIAGNOSIS — R7303 Prediabetes: Secondary | ICD-10-CM | POA: Diagnosis not present

## 2020-06-03 DIAGNOSIS — Z9181 History of falling: Secondary | ICD-10-CM | POA: Diagnosis not present

## 2020-06-03 DIAGNOSIS — E538 Deficiency of other specified B group vitamins: Secondary | ICD-10-CM | POA: Diagnosis not present

## 2020-06-03 DIAGNOSIS — J449 Chronic obstructive pulmonary disease, unspecified: Secondary | ICD-10-CM | POA: Diagnosis not present

## 2020-06-09 ENCOUNTER — Other Ambulatory Visit: Payer: Self-pay

## 2020-06-09 ENCOUNTER — Encounter: Payer: Self-pay | Admitting: Plastic Surgery

## 2020-06-09 ENCOUNTER — Ambulatory Visit: Payer: Medicare Other | Admitting: Plastic Surgery

## 2020-06-09 VITALS — BP 103/61 | HR 83

## 2020-06-09 DIAGNOSIS — R112 Nausea with vomiting, unspecified: Secondary | ICD-10-CM | POA: Diagnosis not present

## 2020-06-09 DIAGNOSIS — Z945 Skin transplant status: Secondary | ICD-10-CM

## 2020-06-09 NOTE — Progress Notes (Signed)
Patient presents about 6 months postop from forehead flap reconstruction of his nose.  He had a through and through defect involving the right nasal tip and ala.  He is overall happy and feels like he is breathing fine.  They do feel that the flap is slightly bulkier inferiorly as it turns underneath to serve as the nasal lining.  On examination his forehead is healed fairly well.  There is a little bit of widening of the scar.  The overall nasal shape of the reconstruction is quite good.  The portion of the flap that has reconstructed the alar rim is slightly bulkier compared to the surrounding tissue and there is some mild pincushioning in this area.  I long discussion with the patient about his options.  He could certainly live with this resolved as it is a fairly good result currently.  I did discuss revision with thinning out that area and I think this would give a subtle improvement if he is interested.  They seem to be in favor of this and explained the risks and benefits in detail along with where I would make the incision and what I would be performing.  They are fully understanding and will get this scheduled

## 2020-07-14 ENCOUNTER — Ambulatory Visit (INDEPENDENT_AMBULATORY_CARE_PROVIDER_SITE_OTHER): Payer: Medicare Other | Admitting: Plastic Surgery

## 2020-07-14 ENCOUNTER — Other Ambulatory Visit: Payer: Self-pay

## 2020-07-14 ENCOUNTER — Encounter: Payer: Self-pay | Admitting: Plastic Surgery

## 2020-07-14 VITALS — BP 118/71 | HR 82

## 2020-07-14 DIAGNOSIS — Z945 Skin transplant status: Secondary | ICD-10-CM

## 2020-07-14 NOTE — Progress Notes (Signed)
Operative Note   DATE OF OPERATION: 07/14/2020  LOCATION:    SURGICAL DEPARTMENT: Plastic Surgery  PREOPERATIVE DIAGNOSES: Paramedian forehead flap nasal reconstruction  POSTOPERATIVE DIAGNOSES:  same  PROCEDURE:  1. Debulking of right nostril area of paramedian forehead flap with complex closure covering 2.75 cm  SURGEON: Talmadge Coventry, MD  ANESTHESIA:  Local  COMPLICATIONS: None.   INDICATIONS FOR PROCEDURE:  The patient, Kevin Lynch is a 86 y.o. male born on 1932-06-13, is here for treatment of nasal reconstruction with slightly bulky inferior aspect of paramedian forehead flap MRN: [id number]  CONSENT:  Informed consent was obtained directly from the patient. Risks, benefits and alternatives were fully discussed. Specific risks including but not limited to bleeding, infection, hematoma, seroma, scarring, pain, infection, wound healing problems, and need for further surgery were all discussed. The patient did have an ample opportunity to have questions answered to satisfaction.   DESCRIPTION OF PROCEDURE:  Local anesthesia was administered. The patient's operative site was prepped and draped in a sterile fashion. A time out was performed and all information was confirmed to be correct.  I incised along the nostril rim with a 15 blade.  I also excised about a millimeter of skin on either side of the lateral borders of the flap as there was some pincushioning in those areas.  I then elevated the flap very thinly the superior direction to expose the area of bulk that I wanted to address.  I then debrided the subcutaneous tissue that encompassed this area taking care to leave the underlying cartilage framework intact.  Hemostasis was then obtained.  The lateral and medial aspect of the closure were performed in layers with interrupted buried 5-0 Monocryl sutures and 5-0 Monocryl mattress sutures.  The inferior aspect was then closed in layers with interrupted buried 5-0 Monocryl  sutures and a running 5-0 Monocryl for the skin.  This gave a nice on table result.  Total length of closure was 2.75 cm.   The patient tolerated the procedure well.  There were no complications.

## 2020-08-12 ENCOUNTER — Other Ambulatory Visit: Payer: Self-pay

## 2020-08-12 ENCOUNTER — Ambulatory Visit: Payer: Medicare Other | Admitting: Plastic Surgery

## 2020-08-12 ENCOUNTER — Encounter: Payer: Self-pay | Admitting: Plastic Surgery

## 2020-08-12 VITALS — BP 118/74 | HR 71

## 2020-08-12 DIAGNOSIS — Z945 Skin transplant status: Secondary | ICD-10-CM

## 2020-08-12 NOTE — Progress Notes (Signed)
Patient presents about 1 month postop from debulking of the forehead flap on the nose.  He feels good about the result as does his wife.  On exam everything is healed nicely from the debulking.  There is still some pincushioning medially and laterally but the overall appearance is improved.  I would expect this continue to improve as the edema continues to settle out of the tissues.  At this point they are satisfied and we will plan to have him follow-up on an as-needed basis.  All her questions were answered.

## 2020-08-25 DIAGNOSIS — D0439 Carcinoma in situ of skin of other parts of face: Secondary | ICD-10-CM | POA: Diagnosis not present

## 2020-08-25 DIAGNOSIS — D485 Neoplasm of uncertain behavior of skin: Secondary | ICD-10-CM | POA: Diagnosis not present

## 2020-08-25 DIAGNOSIS — C44321 Squamous cell carcinoma of skin of nose: Secondary | ICD-10-CM | POA: Diagnosis not present

## 2020-08-25 DIAGNOSIS — L821 Other seborrheic keratosis: Secondary | ICD-10-CM | POA: Diagnosis not present

## 2020-08-25 DIAGNOSIS — D044 Carcinoma in situ of skin of scalp and neck: Secondary | ICD-10-CM | POA: Diagnosis not present

## 2020-08-25 DIAGNOSIS — Z85820 Personal history of malignant melanoma of skin: Secondary | ICD-10-CM | POA: Diagnosis not present

## 2020-08-25 DIAGNOSIS — L905 Scar conditions and fibrosis of skin: Secondary | ICD-10-CM | POA: Diagnosis not present

## 2020-08-25 DIAGNOSIS — Z85828 Personal history of other malignant neoplasm of skin: Secondary | ICD-10-CM | POA: Diagnosis not present

## 2020-10-14 DIAGNOSIS — J449 Chronic obstructive pulmonary disease, unspecified: Secondary | ICD-10-CM | POA: Diagnosis not present

## 2020-10-14 DIAGNOSIS — I10 Essential (primary) hypertension: Secondary | ICD-10-CM | POA: Diagnosis not present

## 2020-10-14 DIAGNOSIS — R7303 Prediabetes: Secondary | ICD-10-CM | POA: Diagnosis not present

## 2020-10-14 DIAGNOSIS — Z9181 History of falling: Secondary | ICD-10-CM | POA: Diagnosis not present

## 2020-10-14 DIAGNOSIS — R112 Nausea with vomiting, unspecified: Secondary | ICD-10-CM | POA: Diagnosis not present

## 2020-10-14 DIAGNOSIS — I700 Atherosclerosis of aorta: Secondary | ICD-10-CM | POA: Diagnosis not present

## 2020-10-14 DIAGNOSIS — I5030 Unspecified diastolic (congestive) heart failure: Secondary | ICD-10-CM | POA: Diagnosis not present

## 2020-10-14 DIAGNOSIS — E782 Mixed hyperlipidemia: Secondary | ICD-10-CM | POA: Diagnosis not present

## 2020-10-14 DIAGNOSIS — H6122 Impacted cerumen, left ear: Secondary | ICD-10-CM | POA: Diagnosis not present

## 2020-10-14 DIAGNOSIS — E538 Deficiency of other specified B group vitamins: Secondary | ICD-10-CM | POA: Diagnosis not present

## 2020-10-25 ENCOUNTER — Telehealth: Payer: Self-pay | Admitting: Gastroenterology

## 2020-10-25 NOTE — Telephone Encounter (Signed)
Absolutely Can work him into my clinic or APP clinic, whichever is faster RG

## 2020-10-25 NOTE — Telephone Encounter (Signed)
Hey Dr Lyndel Safe, this is a former pt of yours being referred to [place] for nausea and vomiting but it looks like the pt saw Dr Lynita [facility] in 2018. Would you agree to take this pt back?

## 2020-11-10 DIAGNOSIS — L819 Disorder of pigmentation, unspecified: Secondary | ICD-10-CM | POA: Diagnosis not present

## 2020-11-10 DIAGNOSIS — D229 Melanocytic nevi, unspecified: Secondary | ICD-10-CM | POA: Diagnosis not present

## 2020-11-10 DIAGNOSIS — L821 Other seborrheic keratosis: Secondary | ICD-10-CM | POA: Diagnosis not present

## 2020-11-10 DIAGNOSIS — Z85820 Personal history of malignant melanoma of skin: Secondary | ICD-10-CM | POA: Diagnosis not present

## 2020-11-10 DIAGNOSIS — L814 Other melanin hyperpigmentation: Secondary | ICD-10-CM | POA: Diagnosis not present

## 2020-11-10 DIAGNOSIS — Z85828 Personal history of other malignant neoplasm of skin: Secondary | ICD-10-CM | POA: Diagnosis not present

## 2021-02-16 DIAGNOSIS — H35373 Puckering of macula, bilateral: Secondary | ICD-10-CM | POA: Diagnosis not present

## 2021-02-16 DIAGNOSIS — Z961 Presence of intraocular lens: Secondary | ICD-10-CM | POA: Diagnosis not present

## 2021-03-10 DIAGNOSIS — Z9181 History of falling: Secondary | ICD-10-CM | POA: Diagnosis not present

## 2021-03-10 DIAGNOSIS — I10 Essential (primary) hypertension: Secondary | ICD-10-CM | POA: Diagnosis not present

## 2021-03-10 DIAGNOSIS — H6120 Impacted cerumen, unspecified ear: Secondary | ICD-10-CM | POA: Diagnosis not present

## 2021-03-10 DIAGNOSIS — I5030 Unspecified diastolic (congestive) heart failure: Secondary | ICD-10-CM | POA: Diagnosis not present

## 2021-03-10 DIAGNOSIS — I7789 Other specified disorders of arteries and arterioles: Secondary | ICD-10-CM | POA: Diagnosis not present

## 2021-03-10 DIAGNOSIS — I700 Atherosclerosis of aorta: Secondary | ICD-10-CM | POA: Diagnosis not present

## 2021-03-10 DIAGNOSIS — Z139 Encounter for screening, unspecified: Secondary | ICD-10-CM | POA: Diagnosis not present

## 2021-03-10 DIAGNOSIS — E559 Vitamin D deficiency, unspecified: Secondary | ICD-10-CM | POA: Diagnosis not present

## 2021-03-10 DIAGNOSIS — E782 Mixed hyperlipidemia: Secondary | ICD-10-CM | POA: Diagnosis not present

## 2021-03-10 DIAGNOSIS — E538 Deficiency of other specified B group vitamins: Secondary | ICD-10-CM | POA: Diagnosis not present

## 2021-03-10 DIAGNOSIS — F03911 Unspecified dementia, unspecified severity, with agitation: Secondary | ICD-10-CM | POA: Diagnosis not present

## 2021-03-10 DIAGNOSIS — D692 Other nonthrombocytopenic purpura: Secondary | ICD-10-CM | POA: Diagnosis not present

## 2021-03-10 DIAGNOSIS — J449 Chronic obstructive pulmonary disease, unspecified: Secondary | ICD-10-CM | POA: Diagnosis not present

## 2021-03-10 DIAGNOSIS — R7303 Prediabetes: Secondary | ICD-10-CM | POA: Diagnosis not present

## 2021-04-15 DIAGNOSIS — I7789 Other specified disorders of arteries and arterioles: Secondary | ICD-10-CM | POA: Diagnosis not present

## 2021-04-15 DIAGNOSIS — R918 Other nonspecific abnormal finding of lung field: Secondary | ICD-10-CM | POA: Diagnosis not present

## 2021-04-15 DIAGNOSIS — I2510 Atherosclerotic heart disease of native coronary artery without angina pectoris: Secondary | ICD-10-CM | POA: Diagnosis not present

## 2021-04-15 DIAGNOSIS — I700 Atherosclerosis of aorta: Secondary | ICD-10-CM | POA: Diagnosis not present

## 2021-05-10 ENCOUNTER — Telehealth: Payer: Self-pay | Admitting: *Deleted

## 2021-05-10 NOTE — Telephone Encounter (Signed)
Returned PC to patient's wife, Anne Ng - she called earlier to ask if we received results of CT scan patient had done by [facility], Dr. Delena Bali.  Informed her we do not have CT results, RN also contacted Dr. Trudi Ida office [phone]) requesting copy of report, fax number given, [phone].  She verbalizes understanding.

## 2021-05-11 ENCOUNTER — Telehealth: Payer: Self-pay | Admitting: *Deleted

## 2021-05-11 NOTE — Telephone Encounter (Signed)
PC to patient's wife, Anne Ng, informed her of Dr. Hazeline Junker message below.  She verbalizes understanding.

## 2021-05-11 NOTE — Telephone Encounter (Signed)
-----   Message from Wyatt Portela, MD sent at 05/11/2021  9:48 AM EST ----- The area described in the spleen is not consistent with cancer based on the radiology report. ----- Message ----- From: Rolene Course, RN Sent: 05/11/2021   9:30 AM EST To: Wyatt Portela, MD  This patient's wife has called, I put a copy of his chest CT from [facility] on your desk yesterday which you reviewed.  She is concerned about his spleen & is asking for your recommendation.  Please advise.  Thanks, Bethena Roys

## 2021-06-08 DIAGNOSIS — D225 Melanocytic nevi of trunk: Secondary | ICD-10-CM | POA: Diagnosis not present

## 2021-06-08 DIAGNOSIS — L821 Other seborrheic keratosis: Secondary | ICD-10-CM | POA: Diagnosis not present

## 2021-06-08 DIAGNOSIS — L814 Other melanin hyperpigmentation: Secondary | ICD-10-CM | POA: Diagnosis not present

## 2021-08-22 ENCOUNTER — Other Ambulatory Visit: Payer: Self-pay

## 2021-08-22 ENCOUNTER — Emergency Department (HOSPITAL_BASED_OUTPATIENT_CLINIC_OR_DEPARTMENT_OTHER)
Admission: EM | Admit: 2021-08-22 | Discharge: 2021-08-22 | Disposition: A | Discharge status: Home / Self Care | Payer: Medicare Other | Attending: Emergency Medicine | Admitting: Emergency Medicine

## 2021-08-22 ENCOUNTER — Emergency Department (HOSPITAL_BASED_OUTPATIENT_CLINIC_OR_DEPARTMENT_OTHER): Payer: Medicare Other

## 2021-08-22 ENCOUNTER — Encounter (HOSPITAL_BASED_OUTPATIENT_CLINIC_OR_DEPARTMENT_OTHER): Payer: Self-pay | Admitting: Emergency Medicine

## 2021-08-22 DIAGNOSIS — R001 Bradycardia, unspecified: Secondary | ICD-10-CM | POA: Diagnosis not present

## 2021-08-22 DIAGNOSIS — F0390 Unspecified dementia without behavioral disturbance: Secondary | ICD-10-CM | POA: Diagnosis not present

## 2021-08-22 DIAGNOSIS — J449 Chronic obstructive pulmonary disease, unspecified: Secondary | ICD-10-CM | POA: Diagnosis not present

## 2021-08-22 DIAGNOSIS — R0981 Nasal congestion: Secondary | ICD-10-CM | POA: Diagnosis not present

## 2021-08-22 DIAGNOSIS — R531 Weakness: Secondary | ICD-10-CM | POA: Diagnosis not present

## 2021-08-22 DIAGNOSIS — J3489 Other specified disorders of nose and nasal sinuses: Secondary | ICD-10-CM | POA: Diagnosis not present

## 2021-08-22 DIAGNOSIS — Z7951 Long term (current) use of inhaled steroids: Secondary | ICD-10-CM | POA: Insufficient documentation

## 2021-08-22 DIAGNOSIS — N189 Chronic kidney disease, unspecified: Secondary | ICD-10-CM | POA: Diagnosis not present

## 2021-08-22 DIAGNOSIS — Z85828 Personal history of other malignant neoplasm of skin: Secondary | ICD-10-CM | POA: Insufficient documentation

## 2021-08-22 DIAGNOSIS — I130 Hypertensive heart and chronic kidney disease with heart failure and stage 1 through stage 4 chronic kidney disease, or unspecified chronic kidney disease: Secondary | ICD-10-CM | POA: Diagnosis not present

## 2021-08-22 DIAGNOSIS — Z20822 Contact with and (suspected) exposure to covid-19: Secondary | ICD-10-CM | POA: Insufficient documentation

## 2021-08-22 DIAGNOSIS — Z7902 Long term (current) use of antithrombotics/antiplatelets: Secondary | ICD-10-CM | POA: Diagnosis not present

## 2021-08-22 DIAGNOSIS — R42 Dizziness and giddiness: Secondary | ICD-10-CM | POA: Diagnosis not present

## 2021-08-22 DIAGNOSIS — Z79899 Other long term (current) drug therapy: Secondary | ICD-10-CM | POA: Insufficient documentation

## 2021-08-22 DIAGNOSIS — R519 Headache, unspecified: Secondary | ICD-10-CM | POA: Diagnosis not present

## 2021-08-22 DIAGNOSIS — R7989 Other specified abnormal findings of blood chemistry: Secondary | ICD-10-CM | POA: Insufficient documentation

## 2021-08-22 DIAGNOSIS — I5031 Acute diastolic (congestive) heart failure: Secondary | ICD-10-CM | POA: Diagnosis not present

## 2021-08-22 LAB — CBC WITH DIFFERENTIAL/PLATELET
Abs Immature Granulocytes: 0.03 10*3/uL (ref 0.00–0.07)
Basophils Absolute: 0.1 10*3/uL (ref 0.0–0.1)
Basophils Relative: 1 %
Eosinophils Absolute: 0.2 10*3/uL (ref 0.0–0.5)
Eosinophils Relative: 2 %
HCT: 42.3 % (ref 39.0–52.0)
Hemoglobin: 13.7 g/dL (ref 13.0–17.0)
Immature Granulocytes: 0 %
Lymphocytes Relative: 23 %
Lymphs Abs: 1.9 10*3/uL (ref 0.7–4.0)
MCH: 30.7 pg (ref 26.0–34.0)
MCHC: 32.4 g/dL (ref 30.0–36.0)
MCV: 94.8 fL (ref 80.0–100.0)
Monocytes Absolute: 0.7 10*3/uL (ref 0.1–1.0)
Monocytes Relative: 8 %
Neutro Abs: 5.6 10*3/uL (ref 1.7–7.7)
Neutrophils Relative %: 66 %
Platelets: 213 10*3/uL (ref 150–400)
RBC: 4.46 MIL/uL (ref 4.22–5.81)
RDW: 12.7 % (ref 11.5–15.5)
WBC: 8.5 10*3/uL (ref 4.0–10.5)
nRBC: 0 % (ref 0.0–0.2)

## 2021-08-22 LAB — COMPREHENSIVE METABOLIC PANEL
ALT: 11 U/L (ref 0–44)
AST: 12 U/L — ABNORMAL LOW (ref 15–41)
Albumin: 3.5 g/dL (ref 3.5–5.0)
Alkaline Phosphatase: 40 U/L (ref 38–126)
Anion gap: 7 (ref 5–15)
BUN: 31 mg/dL — ABNORMAL HIGH (ref 8–23)
CO2: 29 mmol/L (ref 22–32)
Calcium: 9.4 mg/dL (ref 8.9–10.3)
Chloride: 105 mmol/L (ref 98–111)
Creatinine, Ser: 1.74 mg/dL — ABNORMAL HIGH (ref 0.61–1.24)
GFR, Estimated: 38 mL/min — ABNORMAL LOW (ref >60)
Glucose, Bld: 88 mg/dL (ref 70–99)
Potassium: 3.9 mmol/L (ref 3.5–5.1)
Sodium: 141 mmol/L (ref 135–145)
Total Bilirubin: 0.5 mg/dL (ref 0.3–1.2)
Total Protein: 5.8 g/dL — ABNORMAL LOW (ref 6.5–8.1)

## 2021-08-22 LAB — URINALYSIS, ROUTINE W REFLEX MICROSCOPIC
Bilirubin Urine: NEGATIVE
Glucose, UA: NEGATIVE mg/dL
Hgb urine dipstick: NEGATIVE
Ketones, ur: NEGATIVE mg/dL
Leukocytes,Ua: NEGATIVE
Nitrite: NEGATIVE
Protein, ur: NEGATIVE mg/dL
Specific Gravity, Urine: 1.016 (ref 1.005–1.030)
pH: 5 (ref 5.0–8.0)

## 2021-08-22 LAB — MAGNESIUM: Magnesium: 2.1 mg/dL (ref 1.7–2.4)

## 2021-08-22 LAB — RESP PANEL BY RT-PCR (FLU A&B, COVID) ARPGX2
Influenza A by PCR: NEGATIVE
Influenza B by PCR: NEGATIVE
SARS Coronavirus 2 by RT PCR: NEGATIVE

## 2021-08-22 LAB — CBG MONITORING, ED: Glucose-Capillary: 85 mg/dL (ref 70–99)

## 2021-08-22 IMAGING — CT CT HEAD W/O CM
4 of 5 series · 16 of 47 positions shown, 17 images · non-contrast
Comparison: [DATE].

CLINICAL DATA: Altered mental status.



[Series 2: head wo ax · axial · 0.35mm/px · z∈[+12,+87]mm · 3 of 34 slices shown, 4 images]
[im 9/34  brain]
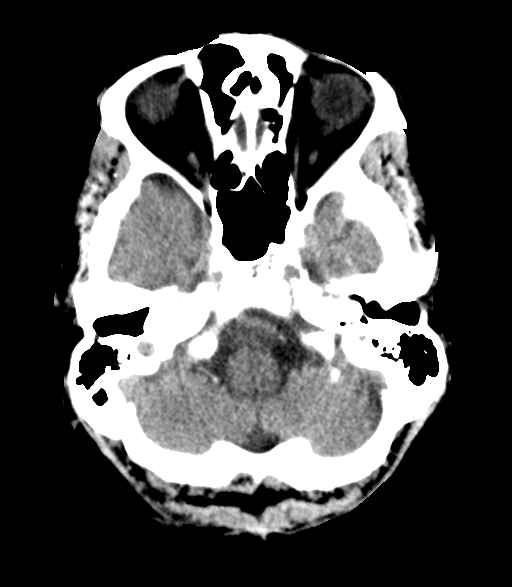
[im 9/34  bone]
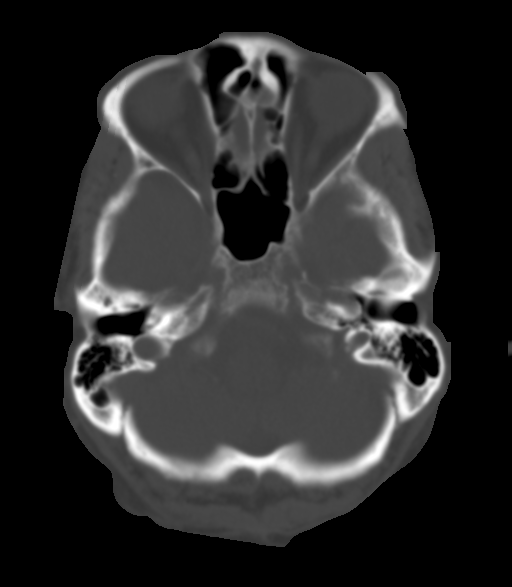
[im 17/34  brain]
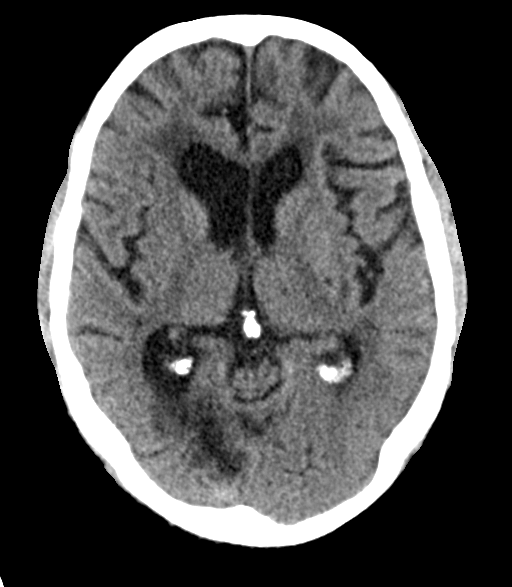
[im 25/34  brain]
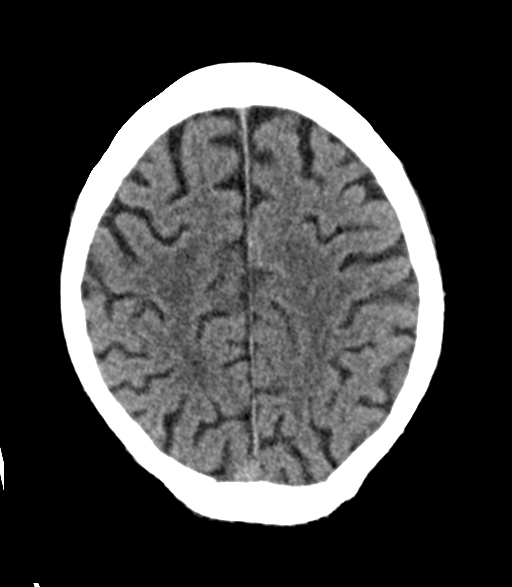

[Series 3: coronal soft · coronal · 0.33mm/px · 3 of 69 slices shown]
[im 23/69  brain]
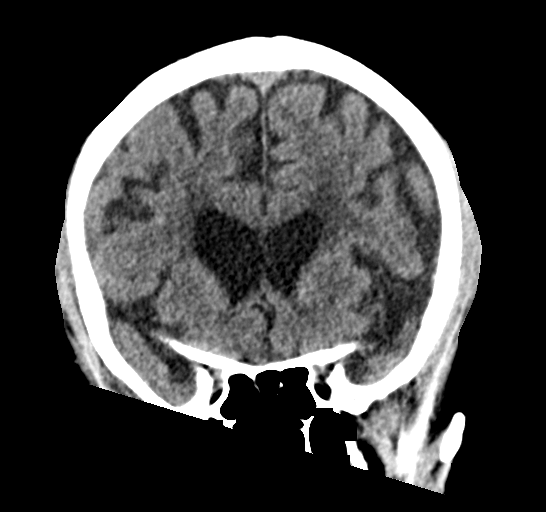
[im 31/69  brain]
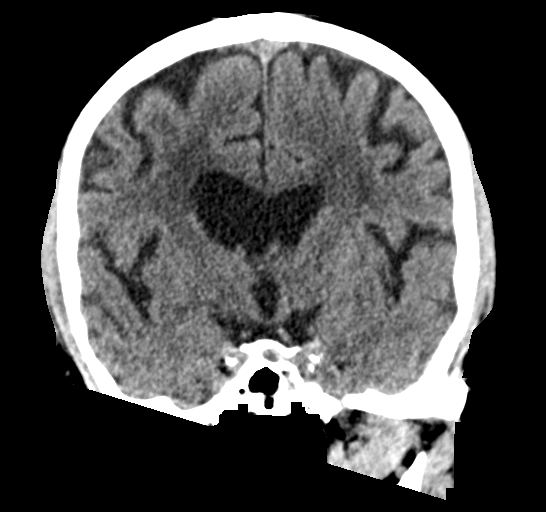
[im 38/69  brain]
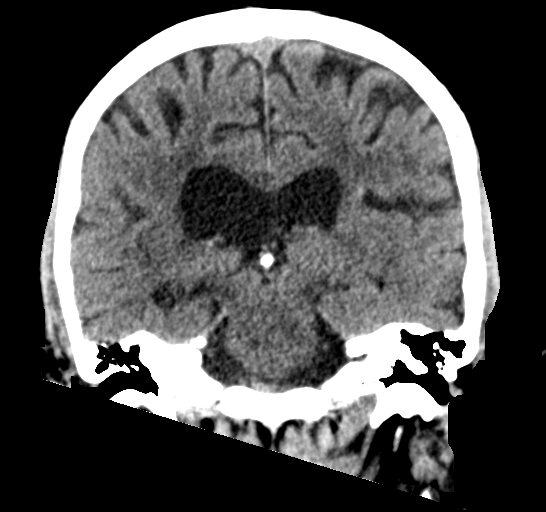

[Series 4: sagittal soft · sagittal · 0.33mm/px · 3 of 59 slices shown]
[im 26/59  brain]
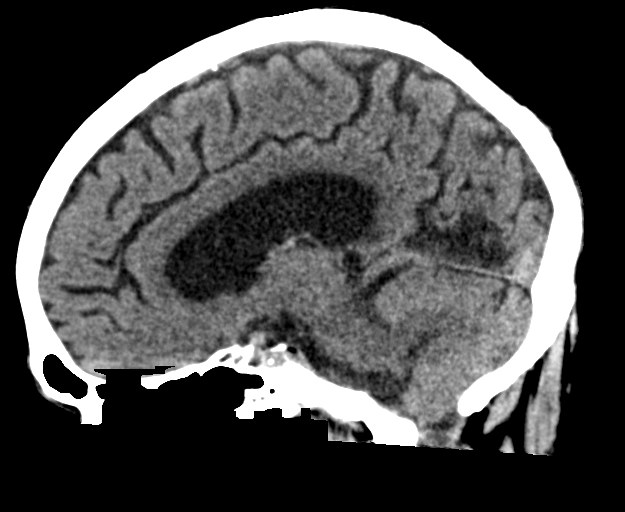
[im 30/59  brain]
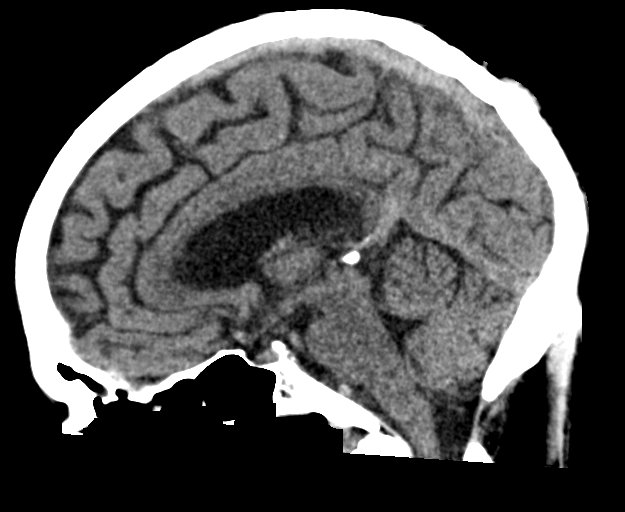
[im 33/59  brain]
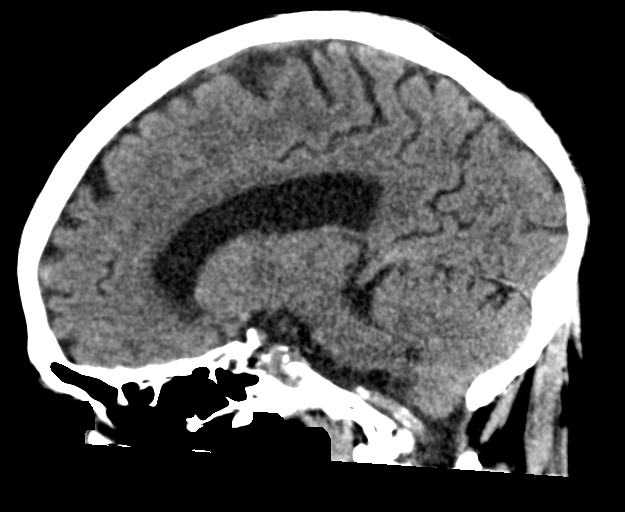

[Series 6: ax head bone 2 · axial · 0.35mm/px · z∈[-15,+90]mm · 7 of 84 slices shown]
[im 7/84  bone]
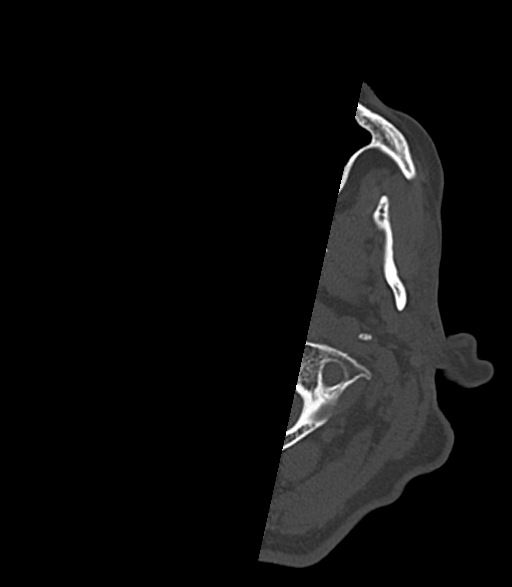
[im 21/84  bone]
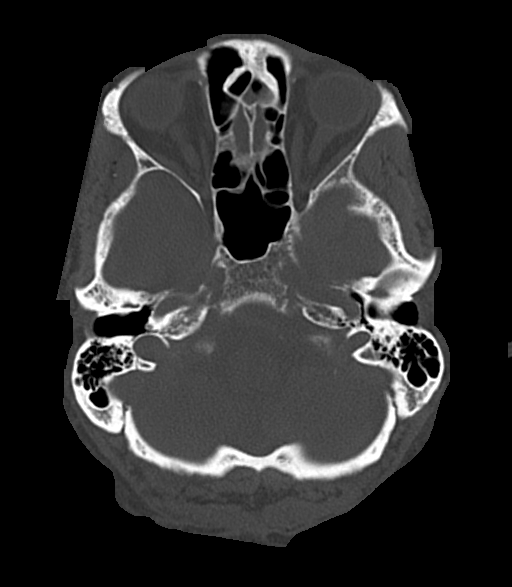
[im 28/84  bone]
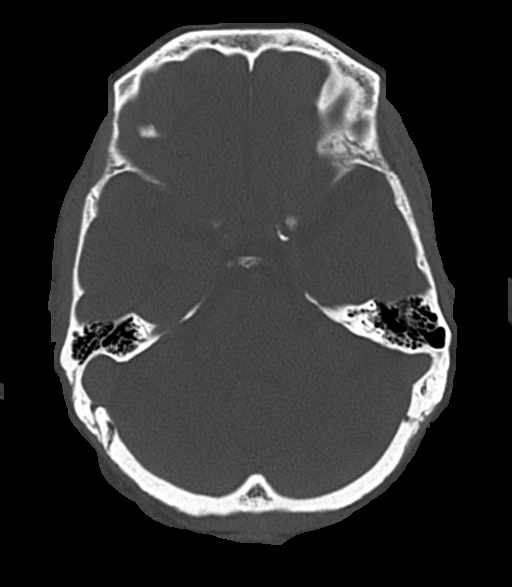
[im 35/84  bone]
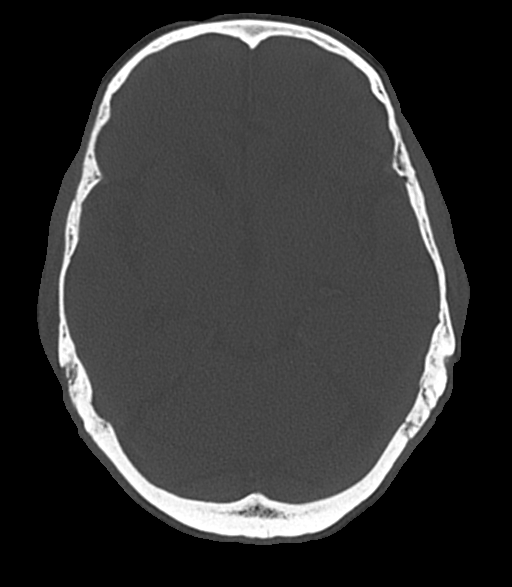
[im 49/84  bone]
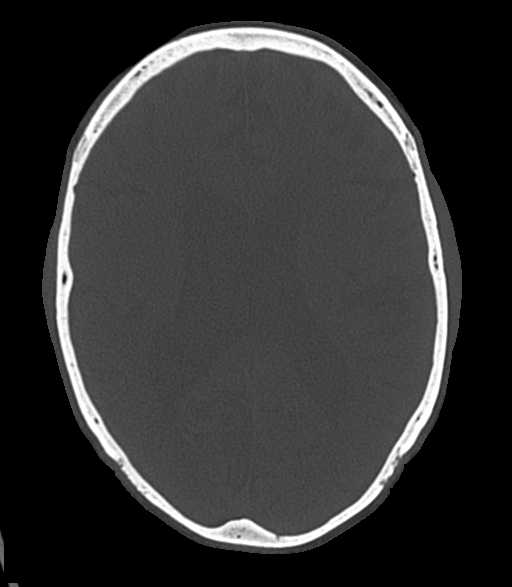
[im 56/84  bone]
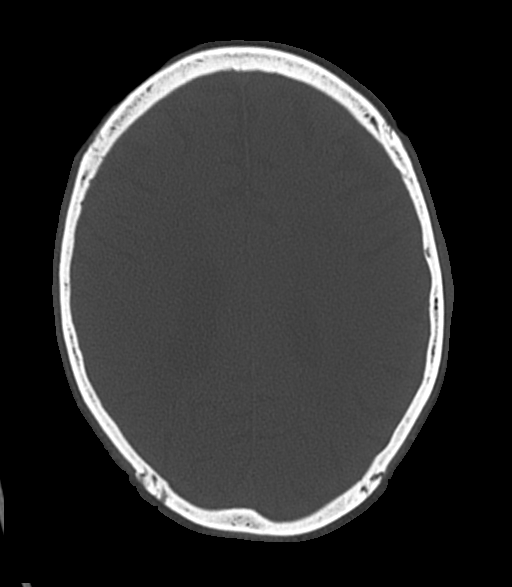
[im 63/84  bone]
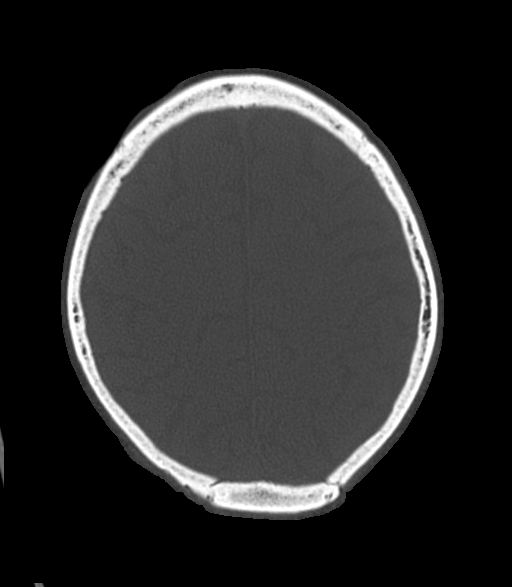

[16 of 47 positions shown; findings below may reference images not displayed]

FINDINGS: Brain: Mild chronic ischemic white matter disease is noted. Old
right occipital infarction is noted. No mass effect or midline shift
is noted. Ventricular size is within normal limits. There is no
evidence of mass lesion, hemorrhage or acute infarction.

Vascular: No hyperdense vessel or unexpected calcification.

Skull: Normal. Negative for fracture or focal lesion.

Sinuses/Orbits: No acute finding.

Other: None.
IMPRESSION: No acute intracranial abnormality seen.

## 2021-08-22 MED ORDER — SODIUM CHLORIDE 0.9 % IV BOLUS
1000.0000 mL | Freq: Once | INTRAVENOUS | Status: AC
  Administered 2021-08-22: 1000 mL via INTRAVENOUS

## 2021-08-22 NOTE — ED Notes (Signed)
Pt bladder scanned. Highest 144 scanned. Informed Josh Materials engineer) ?

## 2021-08-22 NOTE — ED Provider Notes (Signed)
3:53 PM ?Care assumed from Dr. Langston Masker.  ? ?At time of transfer of care, is awaiting for results of urinalysis.  Plan of care will be to discharge after urine has been completed as the rest of his work-up was overall reassuring. ? ?5:24 PM ?Urinalysis returned reassuring.  Per previous team plan, patient will be discharged home to follow-up with PCP.  Patient agrees and family agrees.  Patient will be discharged for outpatient follow-up.  ? ?Clinical Impression: ?1. Nonintractable headache, unspecified chronicity pattern, unspecified headache type   ? ? ?Disposition: Discharge ? ?Condition: Good ? ?I have discussed the results, Dx and Tx plan with the pt(& family if present). He/she/they expressed understanding and agree(s) with the plan. Discharge instructions discussed at great length. Strict return precautions discussed and pt &/or family have verbalized understanding of the instructions. No further questions at time of discharge.  ? ? ?New Prescriptions  ? No medications on file  ? ? ?Follow Up: ?Renaldo Reel, PA ?[place] [address] ?[phone] ? ?Schedule an appointment as soon as possible for a visit in 3 days ? ? ? ?  ?Shandel Busic, Gwenyth Allegra, MD ?08/22/21 1725 ? ?

## 2021-08-22 NOTE — ED Notes (Signed)
Patient and wife verbalizes understanding of discharge instructions. Opportunity for questioning and answers were provided. Patient discharged from ED.  ?

## 2021-08-22 NOTE — ED Provider Notes (Signed)
?[facility] ?Provider Note ? ? ?CSN: [id number] ?Arrival date & time: 08/22/21  1109 ? ?  ? ?History ? ?Chief Complaint  ?Patient presents with  ? Dizziness  ? Headache  ? ? ?Jerry Clyne. is a 88 y.o. male with a history of skin cancer s/p resection w/ skin grafting (right parietal scalp), recurrent sinusitis, dementia, coronary disease, presenting from home in the company of his wife with concern for dizziness and weakness.  His wife provides the majority of the history as the patient does have dementia and is not a reliable historian.  She feels that the patient may be dehydrated, as he drinks very little water at baseline, despite her efforts to push fluids.  She says the patient was complaining of generalized weakness for the past several days.  He walks very slowly, shuffling gait, with a cane or walker, at baseline.  She reports he was complaining of a frontal headache on and off for the past 2 days as well.  He was having some dizziness, and is not clear whether he is lightheaded or vertiginous per her history of the patient's.  He has not had any or upper respiratory issues, coughing, he does have a history of COPD.  She reports he has had some congestion and rhinorrhea. ? ?Earlier today she reports the patient had an episode where they were in the bathroom and she reported his eyes appeared to roll up in his head and his right arm was shaking lasting about 10 to 15 seconds.  Immediately afterwards the patient was able to communicate and was coherent, no prolonged confusional state, did not urinate on self.  He has no prior history of seizures. ? ?The patient himself appears pleasantly demented and has no acute complaints.  He says he has a very mild headache.  He denies any other active issues. ? ?HPI ? ?  ? ?Home Medications ?Prior to Admission medications   ?Medication Sig Start Date End Date Taking? Authorizing Provider  ?ACETAMINOPHEN PO Take by mouth.    [provider]  ?albuterol (PROVENTIL) (2.5 MG/3ML) 0.083% nebulizer solution Take 2.5 mg by nebulization every 6 (six) hours as needed for wheezing or shortness of breath.    [provider]  ?clopidogrel (PLAVIX) 75 MG tablet Take 75 mg by mouth daily.    [provider]  ?Coenzyme Q10 (COQ-10) 100 MG CAPS Take 100 mg by mouth daily.     [provider]  ?donepezil (ARICEPT) 10 MG tablet Take 10 mg by mouth at bedtime.    [provider]  ?escitalopram (LEXAPRO) 20 MG tablet Take 20 mg by mouth daily.     [provider]  ?gabapentin (NEURONTIN) 100 MG capsule Take 100 mg by mouth at bedtime.    [provider]  ?levocetirizine (XYZAL) 5 MG tablet Take 5 mg by mouth every morning.     [provider]  ?Loperamide HCl (IMODIUM PO) Take 2 mg by mouth daily as needed (upset stomach).     [provider]  ?memantine (NAMENDA) 10 MG tablet Take 10 mg by mouth 2 (two) times daily.  10/27/17   [provider]  ?metoprolol succinate (TOPROL-XL) 25 MG 24 hr tablet Take 12.5 mg by mouth daily.    [provider]  ?Omega-3 Fatty Acids (FISH OIL PO) Take by mouth daily.    [provider]  ?QUEtiapine (SEROQUEL) 25 MG tablet Take 25 mg by mouth at bedtime. 10/22/19  [provider]  ?spironolactone (ALDACTONE) 25 MG tablet Take 25 mg by mouth daily.    [provider]  ?terazosin (HYTRIN) 5 MG capsule Take 5 mg by mouth at bedtime.    [provider]  ?Donnal Debar 100-62.5-25 MCG/INH AEPB Inhale 1 puff into the lungs daily. 06/05/20   [provider]  ?valsartan (DIOVAN) 80 MG tablet Take 80 mg by mouth daily.     [provider]  ?   ? ?Allergies    ?Shellfish allergy, Codeine, and Lisinopril   ? ?Review of Systems   ?Review of Systems ? ?Physical Exam ?Updated Vital Signs ?BP 104/87   Pulse (!) 48   Temp 97.7 ?F (36.5 ?C) (Oral)   Resp 10   Ht '5\' 3"'$  (1.6 m)   Wt 77.1 kg    SpO2 97%   BMI 30.11 kg/m?  ?Physical Exam ?Constitutional:   ?   General: He is not in acute distress. ?HENT:  ?   Head: Normocephalic.  ?   Comments: Surgical scar and skin grafting site of the right temporal scalp appears clean, nonerhythematous, no fluctuance or fungating masses ?+Frontal sinus ttp ?Eyes:  ?   Conjunctiva/sclera: Conjunctivae normal.  ?   Pupils: Pupils are equal, round, and reactive to light.  ?Cardiovascular:  ?   Rate and Rhythm: Normal rate and regular rhythm.  ?Pulmonary:  ?   Effort: Pulmonary effort is normal. No respiratory distress.  ?Abdominal:  ?   General: There is no distension.  ?   Tenderness: There is no abdominal tenderness.  ?Skin: ?   General: Skin is warm and dry.  ?Neurological:  ?   General: No focal deficit present.  ?   Mental Status: He is alert. Mental status is at baseline.  ?Psychiatric:     ?   Mood and Affect: Mood normal.     ?   Behavior: Behavior normal.  ? ? ?ED Results / Procedures / Treatments   ?Labs ?(all labs ordered are listed, but only abnormal results are displayed) ?Labs Reviewed  ?COMPREHENSIVE METABOLIC PANEL - Abnormal; Notable for the following components:  ?    Result Value  ? BUN 31 (*)   ? Creatinine, Ser 1.74 (*)   ? Total Protein 5.8 (*)   ? AST 12 (*)   ? GFR, Estimated 38 (*)   ? All other components within normal limits  ?RESP PANEL BY RT-PCR (FLU A&B, COVID) ARPGX2  ?CBC WITH DIFFERENTIAL/PLATELET  ?MAGNESIUM  ?URINALYSIS, ROUTINE W REFLEX MICROSCOPIC  ?CBG MONITORING, ED  ? ? ?EKG ?EKG Interpretation ? ?Date/Time:  Monday August 22 2021 11:20:56 EDT ?Ventricular Rate:  48 ?PR Interval:  297 ?QRS Duration: 97 ?QT Interval:  449 ?QTC Calculation: 402 ?R Axis:   28 ?Text Interpretation: Sinus bradycardia Prolonged PR interval RSR' in V1 or V2, right VCD or RVH Confirmed by Octaviano Glow [phone]) on 08/22/2021 11:28:56 AM ? ?Radiology ?CT Head Wo Contrast ? ?Result Date: 08/22/2021 ?CLINICAL DATA:  Altered mental status. EXAM: CT HEAD WITHOUT  CONTRAST TECHNIQUE: Contiguous axial images were obtained from the base of the skull through the vertex without intravenous contrast. RADIATION DOSE REDUCTION: This exam was performed according to the departmental dose-optimization program which includes automated exposure control, adjustment of the mA and/or kV according to patient size and/or use of iterative reconstruction technique. COMPARISON:  August 27, 2019. FINDINGS: Brain: Mild chronic ischemic white matter disease is noted. Old right occipital infarction is noted. No mass effect  or midline shift is noted. Ventricular size is within normal limits. There is no evidence of mass lesion, hemorrhage or acute infarction. Vascular: No hyperdense vessel or unexpected calcification. Skull: Normal. Negative for fracture or focal lesion. Sinuses/Orbits: No acute finding. Other: None. IMPRESSION: No acute intracranial abnormality seen. Electronically Signed   By: Marijo Conception M.D.   On: 08/22/2021 13:42   ? ?Procedures ?Procedures  ? ? ?Medications Ordered in ED ?Medications  ?sodium chloride 0.9 % bolus 1,000 mL (0 mLs Intravenous Stopped 08/22/21 1325)  ?sodium chloride 0.9 % bolus 1,000 mL (1,000 mLs Intravenous New Bag/Given 08/22/21 1546)  ? ? ?ED Course/ Medical Decision Making/ A&P ?Clinical Course as of 08/22/21 1554  ?Mon Aug 22, 2021  ?1454 Multiple attempts made to encourage patient to urinate, he has a feeling that he needs to urinate but is not able to pass urine.  We will perform a bladder scan, cath if necessary to complete his evaluation.  Anticipate discharge home after urinalysis results, [MT]  ?25 Pt signed out to Dr Glynda Jaeger EDP [MT]  ?  ?Clinical Course User Index ?[MT] Wyvonnia Dusky, MD  ? ?                        ?Medical Decision Making ?Amount and/or Complexity of Data Reviewed ?Labs: ordered. ?Radiology: ordered. ? ? ?This patient presents to the ED with concern for weakness, shaking episode. This involves an extensive number of  treatment options, and is a complaint that carries with it a high risk of complications and morbidity.  The differential diagnosis includes dehydration versus electrolyte derangement versus anemia versus infection versus part

## 2021-08-22 NOTE — Discharge Instructions (Signed)
Your work-up today was overall reassuring.  Please follow-up with your primary doctor and rest and stay hydrated.  If any symptoms change or worsen acutely, please return to the nearest emergency department. ?

## 2021-08-22 NOTE — ED Notes (Signed)
Still trying to get urine. ?

## 2021-08-22 NOTE — ED Triage Notes (Signed)
Pt arrives to ED with c/o dizziness and headache. Pts wife reports that pt has had intermittent dizziness starting on 4/20 and intermittent headache that started 4/22. Pts wife does report pt had a 10-15 second episode yesterday around 1530 where pts eyes were moving side-to-side, right arm shaking, and slightly disoriented after.  ?

## 2021-08-22 NOTE — ED Notes (Signed)
Bladder Scan : 50ML ?

## 2021-09-06 DIAGNOSIS — I10 Essential (primary) hypertension: Secondary | ICD-10-CM | POA: Diagnosis not present

## 2021-09-06 DIAGNOSIS — R413 Other amnesia: Secondary | ICD-10-CM | POA: Diagnosis not present

## 2021-09-06 DIAGNOSIS — E538 Deficiency of other specified B group vitamins: Secondary | ICD-10-CM | POA: Diagnosis not present

## 2021-09-06 DIAGNOSIS — D649 Anemia, unspecified: Secondary | ICD-10-CM | POA: Diagnosis not present

## 2021-09-06 DIAGNOSIS — R7303 Prediabetes: Secondary | ICD-10-CM | POA: Diagnosis not present

## 2021-09-06 DIAGNOSIS — H6120 Impacted cerumen, unspecified ear: Secondary | ICD-10-CM | POA: Diagnosis not present

## 2021-09-06 DIAGNOSIS — F03911 Unspecified dementia, unspecified severity, with agitation: Secondary | ICD-10-CM | POA: Diagnosis not present

## 2021-09-06 DIAGNOSIS — I700 Atherosclerosis of aorta: Secondary | ICD-10-CM | POA: Diagnosis not present

## 2021-09-06 DIAGNOSIS — I5030 Unspecified diastolic (congestive) heart failure: Secondary | ICD-10-CM | POA: Diagnosis not present

## 2021-09-06 DIAGNOSIS — E782 Mixed hyperlipidemia: Secondary | ICD-10-CM | POA: Diagnosis not present

## 2021-09-06 DIAGNOSIS — E559 Vitamin D deficiency, unspecified: Secondary | ICD-10-CM | POA: Diagnosis not present

## 2022-01-29 DIAGNOSIS — Z0000 Encounter for general adult medical examination without abnormal findings: Secondary | ICD-10-CM | POA: Diagnosis not present

## 2022-02-26 DIAGNOSIS — Z0000 Encounter for general adult medical examination without abnormal findings: Secondary | ICD-10-CM | POA: Diagnosis not present

## 2022-03-29 DIAGNOSIS — L905 Scar conditions and fibrosis of skin: Secondary | ICD-10-CM | POA: Diagnosis not present

## 2022-03-29 DIAGNOSIS — Z85820 Personal history of malignant melanoma of skin: Secondary | ICD-10-CM | POA: Diagnosis not present

## 2022-03-29 DIAGNOSIS — Z0000 Encounter for general adult medical examination without abnormal findings: Secondary | ICD-10-CM | POA: Diagnosis not present

## 2022-03-29 DIAGNOSIS — L570 Actinic keratosis: Secondary | ICD-10-CM | POA: Diagnosis not present

## 2022-03-29 DIAGNOSIS — L821 Other seborrheic keratosis: Secondary | ICD-10-CM | POA: Diagnosis not present

## 2022-03-29 DIAGNOSIS — Z08 Encounter for follow-up examination after completed treatment for malignant neoplasm: Secondary | ICD-10-CM | POA: Diagnosis not present

## 2022-03-30 DIAGNOSIS — Z0000 Encounter for general adult medical examination without abnormal findings: Secondary | ICD-10-CM | POA: Diagnosis not present

## 2022-04-13 ENCOUNTER — Emergency Department (HOSPITAL_COMMUNITY): Payer: Medicare Other

## 2022-04-13 ENCOUNTER — Encounter (HOSPITAL_COMMUNITY): Payer: Self-pay | Admitting: Radiology

## 2022-04-13 ENCOUNTER — Inpatient Hospital Stay (HOSPITAL_COMMUNITY)
Admission: EM | Admit: 2022-04-13 | Discharge: 2022-04-18 | DRG: 871 | Disposition: A | Discharge status: Hospice | Payer: Medicare Other | Attending: Internal Medicine | Admitting: Internal Medicine

## 2022-04-13 ENCOUNTER — Other Ambulatory Visit: Payer: Self-pay

## 2022-04-13 DIAGNOSIS — D631 Anemia in chronic kidney disease: Secondary | ICD-10-CM | POA: Diagnosis present

## 2022-04-13 DIAGNOSIS — G2581 Restless legs syndrome: Secondary | ICD-10-CM | POA: Diagnosis not present

## 2022-04-13 DIAGNOSIS — Z85828 Personal history of other malignant neoplasm of skin: Secondary | ICD-10-CM | POA: Diagnosis not present

## 2022-04-13 DIAGNOSIS — R6521 Severe sepsis with septic shock: Secondary | ICD-10-CM | POA: Diagnosis not present

## 2022-04-13 DIAGNOSIS — K828 Other specified diseases of gallbladder: Secondary | ICD-10-CM | POA: Diagnosis not present

## 2022-04-13 DIAGNOSIS — E875 Hyperkalemia: Secondary | ICD-10-CM | POA: Diagnosis present

## 2022-04-13 DIAGNOSIS — J449 Chronic obstructive pulmonary disease, unspecified: Secondary | ICD-10-CM | POA: Diagnosis present

## 2022-04-13 DIAGNOSIS — N400 Enlarged prostate without lower urinary tract symptoms: Secondary | ICD-10-CM | POA: Diagnosis present

## 2022-04-13 DIAGNOSIS — R140 Abdominal distension (gaseous): Secondary | ICD-10-CM | POA: Diagnosis not present

## 2022-04-13 DIAGNOSIS — K819 Cholecystitis, unspecified: Principal | ICD-10-CM

## 2022-04-13 DIAGNOSIS — F32A Depression, unspecified: Secondary | ICD-10-CM | POA: Diagnosis present

## 2022-04-13 DIAGNOSIS — R109 Unspecified abdominal pain: Secondary | ICD-10-CM | POA: Diagnosis not present

## 2022-04-13 DIAGNOSIS — Z7902 Long term (current) use of antithrombotics/antiplatelets: Secondary | ICD-10-CM | POA: Diagnosis not present

## 2022-04-13 DIAGNOSIS — F0150 Vascular dementia without behavioral disturbance: Secondary | ICD-10-CM | POA: Diagnosis present

## 2022-04-13 DIAGNOSIS — I10 Essential (primary) hypertension: Secondary | ICD-10-CM | POA: Diagnosis present

## 2022-04-13 DIAGNOSIS — K8000 Calculus of gallbladder with acute cholecystitis without obstruction: Secondary | ICD-10-CM | POA: Diagnosis present

## 2022-04-13 DIAGNOSIS — Z8673 Personal history of transient ischemic attack (TIA), and cerebral infarction without residual deficits: Secondary | ICD-10-CM

## 2022-04-13 DIAGNOSIS — K7689 Other specified diseases of liver: Secondary | ICD-10-CM | POA: Diagnosis not present

## 2022-04-13 DIAGNOSIS — Z79899 Other long term (current) drug therapy: Secondary | ICD-10-CM

## 2022-04-13 DIAGNOSIS — I5032 Chronic diastolic (congestive) heart failure: Secondary | ICD-10-CM | POA: Diagnosis not present

## 2022-04-13 DIAGNOSIS — K257 Chronic gastric ulcer without hemorrhage or perforation: Secondary | ICD-10-CM | POA: Diagnosis present

## 2022-04-13 DIAGNOSIS — Z9359 Other cystostomy status: Secondary | ICD-10-CM | POA: Diagnosis not present

## 2022-04-13 DIAGNOSIS — Z8249 Family history of ischemic heart disease and other diseases of the circulatory system: Secondary | ICD-10-CM | POA: Diagnosis not present

## 2022-04-13 DIAGNOSIS — R6520 Severe sepsis without septic shock: Secondary | ICD-10-CM | POA: Diagnosis present

## 2022-04-13 DIAGNOSIS — K219 Gastro-esophageal reflux disease without esophagitis: Secondary | ICD-10-CM | POA: Diagnosis present

## 2022-04-13 DIAGNOSIS — F0153 Vascular dementia, unspecified severity, with mood disturbance: Secondary | ICD-10-CM | POA: Diagnosis present

## 2022-04-13 DIAGNOSIS — I130 Hypertensive heart and chronic kidney disease with heart failure and stage 1 through stage 4 chronic kidney disease, or unspecified chronic kidney disease: Secondary | ICD-10-CM | POA: Diagnosis not present

## 2022-04-13 DIAGNOSIS — Z7189 Other specified counseling: Secondary | ICD-10-CM | POA: Diagnosis not present

## 2022-04-13 DIAGNOSIS — A419 Sepsis, unspecified organism: Principal | ICD-10-CM | POA: Diagnosis present

## 2022-04-13 DIAGNOSIS — E785 Hyperlipidemia, unspecified: Secondary | ICD-10-CM | POA: Diagnosis not present

## 2022-04-13 DIAGNOSIS — N179 Acute kidney failure, unspecified: Secondary | ICD-10-CM | POA: Diagnosis not present

## 2022-04-13 DIAGNOSIS — Z87891 Personal history of nicotine dependence: Secondary | ICD-10-CM

## 2022-04-13 DIAGNOSIS — N1831 Chronic kidney disease, stage 3a: Secondary | ICD-10-CM | POA: Diagnosis present

## 2022-04-13 DIAGNOSIS — I5030 Unspecified diastolic (congestive) heart failure: Secondary | ICD-10-CM | POA: Diagnosis present

## 2022-04-13 DIAGNOSIS — Z885 Allergy status to narcotic agent status: Secondary | ICD-10-CM

## 2022-04-13 DIAGNOSIS — Z66 Do not resuscitate: Secondary | ICD-10-CM | POA: Diagnosis not present

## 2022-04-13 DIAGNOSIS — Z888 Allergy status to other drugs, medicaments and biological substances status: Secondary | ICD-10-CM

## 2022-04-13 DIAGNOSIS — I739 Peripheral vascular disease, unspecified: Secondary | ICD-10-CM | POA: Diagnosis not present

## 2022-04-13 DIAGNOSIS — Z91013 Allergy to seafood: Secondary | ICD-10-CM

## 2022-04-13 DIAGNOSIS — Z823 Family history of stroke: Secondary | ICD-10-CM

## 2022-04-13 DIAGNOSIS — R7303 Prediabetes: Secondary | ICD-10-CM | POA: Diagnosis present

## 2022-04-13 DIAGNOSIS — K8020 Calculus of gallbladder without cholecystitis without obstruction: Secondary | ICD-10-CM | POA: Diagnosis not present

## 2022-04-13 DIAGNOSIS — Z85820 Personal history of malignant melanoma of skin: Secondary | ICD-10-CM

## 2022-04-13 DIAGNOSIS — K810 Acute cholecystitis: Secondary | ICD-10-CM | POA: Diagnosis present

## 2022-04-13 DIAGNOSIS — R110 Nausea: Secondary | ICD-10-CM | POA: Diagnosis not present

## 2022-04-13 DIAGNOSIS — R531 Weakness: Secondary | ICD-10-CM

## 2022-04-13 DIAGNOSIS — I700 Atherosclerosis of aorta: Secondary | ICD-10-CM | POA: Diagnosis not present

## 2022-04-13 DIAGNOSIS — N281 Cyst of kidney, acquired: Secondary | ICD-10-CM | POA: Diagnosis not present

## 2022-04-13 DIAGNOSIS — Z515 Encounter for palliative care: Secondary | ICD-10-CM | POA: Diagnosis not present

## 2022-04-13 DIAGNOSIS — R197 Diarrhea, unspecified: Secondary | ICD-10-CM | POA: Diagnosis not present

## 2022-04-13 DIAGNOSIS — Z83438 Family history of other disorder of lipoprotein metabolism and other lipidemia: Secondary | ICD-10-CM

## 2022-04-13 DIAGNOSIS — Z825 Family history of asthma and other chronic lower respiratory diseases: Secondary | ICD-10-CM

## 2022-04-13 DIAGNOSIS — Z434 Encounter for attention to other artificial openings of digestive tract: Secondary | ICD-10-CM | POA: Diagnosis not present

## 2022-04-13 DIAGNOSIS — R1084 Generalized abdominal pain: Secondary | ICD-10-CM | POA: Diagnosis not present

## 2022-04-13 LAB — URINALYSIS, ROUTINE W REFLEX MICROSCOPIC
Bacteria, UA: NONE SEEN
Bilirubin Urine: NEGATIVE
Glucose, UA: NEGATIVE mg/dL
Hgb urine dipstick: NEGATIVE
Ketones, ur: 5 mg/dL — AB
Leukocytes,Ua: NEGATIVE
Nitrite: NEGATIVE
Protein, ur: 30 mg/dL — AB
Specific Gravity, Urine: 1.036 — ABNORMAL HIGH (ref 1.005–1.030)
pH: 5 (ref 5.0–8.0)

## 2022-04-13 LAB — CBC
HCT: 46.1 % (ref 39.0–52.0)
Hemoglobin: 14.6 g/dL (ref 13.0–17.0)
MCH: 30.4 pg (ref 26.0–34.0)
MCHC: 31.7 g/dL (ref 30.0–36.0)
MCV: 96 fL (ref 80.0–100.0)
Platelets: 264 10*3/uL (ref 150–400)
RBC: 4.8 MIL/uL (ref 4.22–5.81)
RDW: 11.9 % (ref 11.5–15.5)
WBC: 15.6 10*3/uL — ABNORMAL HIGH (ref 4.0–10.5)
nRBC: 0 % (ref 0.0–0.2)

## 2022-04-13 LAB — COMPREHENSIVE METABOLIC PANEL
ALT: 28 U/L (ref 0–44)
AST: 26 U/L (ref 15–41)
Albumin: 2.9 g/dL — ABNORMAL LOW (ref 3.5–5.0)
Alkaline Phosphatase: 62 U/L (ref 38–126)
Anion gap: 11 (ref 5–15)
BUN: 28 mg/dL — ABNORMAL HIGH (ref 8–23)
CO2: 23 mmol/L (ref 22–32)
Calcium: 9 mg/dL (ref 8.9–10.3)
Chloride: 102 mmol/L (ref 98–111)
Creatinine, Ser: 1.43 mg/dL — ABNORMAL HIGH (ref 0.61–1.24)
GFR, Estimated: 47 mL/min — ABNORMAL LOW (ref >60)
Glucose, Bld: 197 mg/dL — ABNORMAL HIGH (ref 70–99)
Potassium: 4.1 mmol/L (ref 3.5–5.1)
Sodium: 136 mmol/L (ref 135–145)
Total Bilirubin: 0.8 mg/dL (ref 0.3–1.2)
Total Protein: 6 g/dL — ABNORMAL LOW (ref 6.5–8.1)

## 2022-04-13 LAB — LIPASE, BLOOD: Lipase: 25 U/L (ref 11–51)

## 2022-04-13 MED ORDER — SODIUM CHLORIDE (PF) 0.9 % IJ SOLN
INTRAMUSCULAR | Status: AC
  Filled 2022-04-13: qty 50

## 2022-04-13 MED ORDER — PIPERACILLIN-TAZOBACTAM 3.375 G IVPB 30 MIN
3.3750 g | Freq: Once | INTRAVENOUS | Status: AC
  Administered 2022-04-13: 3.375 g via INTRAVENOUS
  Filled 2022-04-13: qty 50

## 2022-04-13 MED ORDER — ENOXAPARIN SODIUM 40 MG/0.4ML IJ SOSY
40.0000 mg | PREFILLED_SYRINGE | INTRAMUSCULAR | Status: DC
  Administered 2022-04-13: 40 mg via SUBCUTANEOUS
  Filled 2022-04-13: qty 0.4

## 2022-04-13 MED ORDER — QUETIAPINE FUMARATE 50 MG PO TABS: 25.0000 mg | ORAL_TABLET | Freq: Every day | ORAL | Status: DC

## 2022-04-13 MED ORDER — IOHEXOL 300 MG/ML  SOLN
75.0000 mL | Freq: Once | INTRAMUSCULAR | Status: AC | PRN
Start: 2022-04-13 — End: 2022-04-13
  Administered 2022-04-13: 75 mL via INTRAVENOUS

## 2022-04-13 MED ORDER — DONEPEZIL HCL 5 MG PO TABS: 10.0000 mg | ORAL_TABLET | Freq: Every day | ORAL | Status: DC

## 2022-04-13 MED ORDER — MEMANTINE HCL 5 MG PO TABS: 10.0000 mg | ORAL_TABLET | Freq: Two times a day (BID) | ORAL | Status: DC

## 2022-04-13 MED ORDER — OXYCODONE HCL 5 MG PO TABS
5.0000 mg | ORAL_TABLET | ORAL | Status: DC | PRN
  Administered 2022-04-15 – 2022-04-16 (×2): 5 mg via ORAL
  Filled 2022-04-13 (×2): qty 1

## 2022-04-13 MED ORDER — SPIRONOLACTONE 25 MG PO TABS
25.0000 mg | ORAL_TABLET | Freq: Every evening | ORAL | Status: DC
  Administered 2022-04-13: 25 mg via ORAL
  Filled 2022-04-13: qty 1

## 2022-04-13 MED ORDER — ESCITALOPRAM OXALATE 20 MG PO TABS
20.0000 mg | ORAL_TABLET | Freq: Every day | ORAL | Status: DC
  Administered 2022-04-13 – 2022-04-17 (×3): 20 mg via ORAL
  Filled 2022-04-13 (×3): qty 1
  Filled 2022-04-13: qty 2

## 2022-04-13 MED ORDER — UMECLIDINIUM BROMIDE 62.5 MCG/ACT IN AEPB
1.0000 | INHALATION_SPRAY | Freq: Every day | RESPIRATORY_TRACT | Status: DC
  Administered 2022-04-16 – 2022-04-18 (×3): 1 via RESPIRATORY_TRACT
  Filled 2022-04-13: qty 7

## 2022-04-13 MED ORDER — METOPROLOL SUCCINATE ER 25 MG PO TB24: 12.5000 mg | ORAL_TABLET | Freq: Every day | ORAL | Status: DC

## 2022-04-13 MED ORDER — FLUTICASONE FUROATE-VILANTEROL 100-25 MCG/ACT IN AEPB
1.0000 | INHALATION_SPRAY | Freq: Every day | RESPIRATORY_TRACT | Status: DC
  Administered 2022-04-16 – 2022-04-18 (×3): 1 via RESPIRATORY_TRACT
  Filled 2022-04-13: qty 28

## 2022-04-13 MED ORDER — TERAZOSIN HCL 5 MG PO CAPS
5.0000 mg | ORAL_CAPSULE | Freq: Every day | ORAL | Status: DC
  Administered 2022-04-16 – 2022-04-17 (×2): 5 mg via ORAL
  Filled 2022-04-13 (×5): qty 1

## 2022-04-13 MED ORDER — IPRATROPIUM-ALBUTEROL 0.5-2.5 (3) MG/3ML IN SOLN: 3.0000 mL | Freq: Four times a day (QID) | RESPIRATORY_TRACT | Status: DC | PRN

## 2022-04-13 MED ORDER — QUETIAPINE FUMARATE 25 MG PO TABS
50.0000 mg | ORAL_TABLET | Freq: Two times a day (BID) | ORAL | Status: DC
  Administered 2022-04-13 – 2022-04-18 (×6): 50 mg via ORAL
  Filled 2022-04-13 (×2): qty 2
  Filled 2022-04-13: qty 1
  Filled 2022-04-13: qty 2
  Filled 2022-04-13 (×2): qty 1
  Filled 2022-04-13: qty 2
  Filled 2022-04-13: qty 1
  Filled 2022-04-13: qty 2

## 2022-04-13 MED ORDER — SODIUM CHLORIDE 0.9 % IV BOLUS
500.0000 mL | Freq: Once | INTRAVENOUS | Status: AC
  Administered 2022-04-13: 500 mL via INTRAVENOUS

## 2022-04-13 MED ORDER — LORAZEPAM 0.5 MG PO TABS: 0.5000 mg | ORAL_TABLET | ORAL | Status: DC | PRN

## 2022-04-13 MED ORDER — HYDROMORPHONE HCL 1 MG/ML IJ SOLN
0.5000 mg | INTRAMUSCULAR | Status: DC | PRN
  Administered 2022-04-13 – 2022-04-17 (×10): 0.5 mg via INTRAVENOUS
  Filled 2022-04-13 (×7): qty 1
  Filled 2022-04-13: qty 0.5
  Filled 2022-04-13: qty 1
  Filled 2022-04-13: qty 0.5
  Filled 2022-04-13: qty 1

## 2022-04-13 MED ORDER — SODIUM CHLORIDE 0.9 % IV SOLN
2.0000 g | INTRAVENOUS | Status: DC
  Administered 2022-04-13 – 2022-04-17 (×5): 2 g via INTRAVENOUS
  Filled 2022-04-13 (×5): qty 20

## 2022-04-13 MED ORDER — IRBESARTAN 75 MG PO TABS: 75.0000 mg | ORAL_TABLET | Freq: Every day | ORAL | Status: DC

## 2022-04-13 MED ORDER — METRONIDAZOLE 500 MG/100ML IV SOLN
500.0000 mg | Freq: Three times a day (TID) | INTRAVENOUS | Status: DC
  Administered 2022-04-13 – 2022-04-18 (×14): 500 mg via INTRAVENOUS
  Filled 2022-04-13 (×14): qty 100

## 2022-04-13 MED ORDER — SENNOSIDES-DOCUSATE SODIUM 8.6-50 MG PO TABS
1.0000 | ORAL_TABLET | Freq: Every day | ORAL | Status: DC
  Administered 2022-04-13 – 2022-04-16 (×2): 1 via ORAL
  Filled 2022-04-13 (×3): qty 1

## 2022-04-13 MED ORDER — SODIUM CHLORIDE 0.9 % IV SOLN: INTRAVENOUS | Status: DC

## 2022-04-13 MED ORDER — SENNOSIDES-DOCUSATE SODIUM 8.6-50 MG PO TABS
2.0000 | ORAL_TABLET | Freq: Every day | ORAL | Status: DC
  Filled 2022-04-13 (×2): qty 2

## 2022-04-13 MED ORDER — IRBESARTAN 75 MG PO TABS
37.5000 mg | ORAL_TABLET | Freq: Every day | ORAL | Status: DC
  Filled 2022-04-13: qty 0.5

## 2022-04-13 MED ORDER — TRAZODONE HCL 50 MG PO TABS
25.0000 mg | ORAL_TABLET | Freq: Every day | ORAL | Status: DC
  Administered 2022-04-16 – 2022-04-17 (×2): 25 mg via ORAL
  Filled 2022-04-13 (×3): qty 1

## 2022-04-13 MED ORDER — LORAZEPAM 0.5 MG PO TABS
0.5000 mg | ORAL_TABLET | Freq: Every day | ORAL | Status: DC
  Administered 2022-04-13: 0.5 mg via ORAL
  Filled 2022-04-13: qty 1

## 2022-04-13 MED ORDER — POLYETHYLENE GLYCOL 3350 17 G PO PACK
17.0000 g | PACK | Freq: Every morning | ORAL | Status: DC
  Filled 2022-04-13: qty 1

## 2022-04-13 MED ORDER — HYDROMORPHONE HCL 1 MG/ML IJ SOLN
0.5000 mg | Freq: Once | INTRAMUSCULAR | Status: AC
  Administered 2022-04-13: 0.5 mg via INTRAVENOUS
  Filled 2022-04-13: qty 1

## 2022-04-13 NOTE — ED Provider Notes (Signed)
[facility] Provider Note   CSN: [id number] Arrival date & time: 04/13/22  1308     History  Chief Complaint  Patient presents with   Abdominal Pain    Kevin Lynch. is a 88 y.o. male.  Patient has a history of dementia and a stroke.  He is a DNR.  He complains of abdominal pain.  The history is provided by a relative. No language interpreter was used.  Abdominal Pain Pain location:  Generalized Pain quality: aching   Pain radiates to:  Does not radiate Pain severity:  Moderate Onset quality:  Sudden Timing:  Constant Progression:  Worsening Chronicity:  New Context: not alcohol use   Relieved by:  Nothing Worsened by:  Nothing Associated symptoms: no chest pain, no cough, no diarrhea, no fatigue and no hematuria        Home Medications Prior to Admission medications   Medication Sig Start Date End Date Taking? Authorizing Provider  ACETAMINOPHEN PO Take by mouth.    [provider]  albuterol (PROVENTIL) (2.5 MG/3ML) 0.083% nebulizer solution Take 2.5 mg by nebulization every 6 (six) hours as needed for wheezing or shortness of breath.    [provider]  clopidogrel (PLAVIX) 75 MG tablet Take 75 mg by mouth daily.    [provider]  Coenzyme Q10 (COQ-10) 100 MG CAPS Take 100 mg by mouth daily.     [provider]  donepezil (ARICEPT) 10 MG tablet Take 10 mg by mouth at bedtime.    [provider]  escitalopram (LEXAPRO) 20 MG tablet Take 20 mg by mouth daily.     [provider]  gabapentin (NEURONTIN) 100 MG capsule Take 100 mg by mouth at bedtime.    [provider]  levocetirizine (XYZAL) 5 MG tablet Take 5 mg by mouth every morning.     [provider]  Loperamide HCl (IMODIUM PO) Take 2 mg by mouth daily as needed (upset stomach).     [provider]  memantine (NAMENDA) 10 MG tablet Take 10 mg by mouth 2 (two) times daily.  10/27/17    [provider]  metoprolol succinate (TOPROL-XL) 25 MG 24 hr tablet Take 12.5 mg by mouth daily.    [provider]  Omega-3 Fatty Acids (FISH OIL PO) Take by mouth daily.    [provider]  QUEtiapine (SEROQUEL) 25 MG tablet Take 25 mg by mouth at bedtime. 10/22/19   [provider]  spironolactone (ALDACTONE) 25 MG tablet Take 25 mg by mouth daily.    [provider]  terazosin (HYTRIN) 5 MG capsule Take 5 mg by mouth at bedtime.    [provider]  TRELEGY ELLIPTA 100-62.5-25 MCG/INH AEPB Inhale 1 puff into the lungs daily. 06/05/20   [provider]  valsartan (DIOVAN) 80 MG tablet Take 80 mg by mouth daily.     [provider]      Allergies    Shellfish allergy, Codeine, and Lisinopril    Review of Systems   Review of Systems  Constitutional:  Negative for appetite change and fatigue.  HENT:  Negative for congestion, ear discharge and sinus pressure.   Eyes:  Negative for discharge.  Respiratory:  Negative for cough.   Cardiovascular:  Negative for chest pain.  Gastrointestinal:  Positive for abdominal pain. Negative for diarrhea.  Genitourinary:  Negative for frequency and hematuria.  Musculoskeletal:  Negative for back pain.  Skin:  Negative for rash.  Neurological:  Negative for seizures and headaches.  Psychiatric/Behavioral:  Negative for hallucinations.     Physical Exam Updated Vital Signs BP 124/76 (BP Location: Right Arm)   Pulse 96   Temp (!) 97.4 F (36.3 C) (Oral)   Resp 16   SpO2 94%  Physical Exam Vitals and nursing note reviewed.  Constitutional:      Appearance: He is well-developed.  HENT:     Head: Normocephalic.     Nose: Nose normal.  Eyes:     General: No scleral icterus.    Conjunctiva/sclera: Conjunctivae normal.  Neck:     Thyroid: No thyromegaly.  Cardiovascular:     Rate and Rhythm: Normal rate and regular rhythm.     Heart sounds: No murmur heard.    No friction  rub. No gallop.  Pulmonary:     Breath sounds: No stridor. No wheezing or rales.  Chest:     Chest wall: No tenderness.  Abdominal:     General: There is distension.     Tenderness: There is abdominal tenderness. There is no rebound.  Musculoskeletal:        General: Normal range of motion.     Cervical back: Neck supple.  Lymphadenopathy:     Cervical: No cervical adenopathy.  Skin:    Findings: No erythema or rash.  Neurological:     Mental Status: He is alert.     Motor: No abnormal muscle tone.     Coordination: Coordination normal.     Comments: Oriented to person only  Psychiatric:        Behavior: Behavior normal.     ED Results / Procedures / Treatments   Labs (all labs ordered are listed, but only abnormal results are displayed) Labs Reviewed  COMPREHENSIVE METABOLIC PANEL - Abnormal; Notable for the following components:      Result Value   Glucose, Bld 197 (*)    BUN 28 (*)    Creatinine, Ser 1.43 (*)    Total Protein 6.0 (*)    Albumin 2.9 (*)    GFR, Estimated 47 (*)    All other components within normal limits  CBC - Abnormal; Notable for the following components:   WBC 15.6 (*)    All other components within normal limits  URINALYSIS, ROUTINE W REFLEX MICROSCOPIC - Abnormal; Notable for the following components:   Specific Gravity, Urine 1.036 (*)    Ketones, ur 5 (*)    Protein, ur 30 (*)    All other components within normal limits  LIPASE, BLOOD    EKG None  Radiology US Abdomen Limited RUQ (LIVER/GB)  Result Date: 04/13/2022 CLINICAL DATA:  Abdominal pain. EXAM: ULTRASOUND ABDOMEN LIMITED RIGHT UPPER QUADRANT COMPARISON:  CT abdomen and pelvis 04/13/2022 FINDINGS: Gallbladder: Thickened gallbladder wall measuring 5 mm. 1 cm gallstone. A positive sonographic Percell Miller sign was reported by the sonographer. Common bile duct: Diameter: 3 mm Liver: Suboptimally assessed due to bowel gas with the known left hepatic lobe cysts on CT not clearly shown  by ultrasound. Portal vein is patent on color Doppler imaging with normal direction of blood flow towards the liver. Other: None. IMPRESSION: Cholelithiasis with gallbladder wall thickening and positive sonographic Murphy sign, suspicious for acute cholecystitis. Electronically Signed   By: Logan Bores M.D.   On: 04/13/2022 13:28   CT ABDOMEN PELVIS W CONTRAST  Result Date: 04/13/2022 CLINICAL DATA:  Abdominal pain, acute nonlocalized. Symptoms for 12 hours. EXAM: CT ABDOMEN AND PELVIS WITH CONTRAST TECHNIQUE:  Multidetector CT imaging of the abdomen and pelvis was performed using the standard protocol following bolus administration of intravenous contrast. RADIATION DOSE REDUCTION: This exam was performed according to the departmental dose-optimization program which includes automated exposure control, adjustment of the mA and/or kV according to patient size and/or use of iterative reconstruction technique. CONTRAST:  8m OMNIPAQUE IOHEXOL 300 MG/ML  SOLN COMPARISON:  Abdominopelvic CT 08/27/2019.  PET-CT 06/13/2019. FINDINGS: Lower chest: Interval mildly increased dependent opacities at both lung bases, likely reflecting atelectasis. No confluent airspace disease, residual suspicious nodularity or significant pleural effusion demonstrated. The heart is enlarged. There is atherosclerosis of the aorta and coronary arteries. Hepatobiliary: The liver is normal in density without suspicious focal abnormality. There are unchanged cysts within the left hepatic lobe. The gallbladder is mildly distended with mild wall thickening and surrounding inflammation. No calcified gallstones. No intra or extrahepatic biliary dilatation. Pancreas: Unremarkable. No pancreatic ductal dilatation or surrounding inflammatory changes. Spleen: Normal in size without focal abnormality. Adrenals/Urinary Tract: Both adrenal glands appear normal. No evidence of urinary tract calculus, suspicious renal lesion or hydronephrosis. Unchanged  simple right renal cysts for which no follow-up imaging is recommended. The bladder appears unremarkable for its degree of distention. Stomach/Bowel: No enteric contrast administered. The stomach appears unremarkable for its degree of distention. Chronic nodular thickening along the lateral duodenal bulb measuring 1.6 x 1.2 cm on image 31/2 is unchanged. No evidence of bowel distension, wall thickening or surrounding inflammation. The appendix appears normal. Mildly prominent stool throughout the colon. There are diverticular changes in the descending and sigmoid colon. Vascular/Lymphatic: There are no enlarged abdominal or pelvic lymph nodes. Aortic and branch vessel atherosclerosis without evidence of aneurysm or large vessel occlusion. The portal, superior mesenteric and splenic veins are patent. Reproductive: Stable mild prostatomegaly. Other: No evidence of abdominal wall mass or hernia. No ascites. Musculoskeletal: No acute or significant osseous findings. Chronic bilateral L5 pars defects with associated grade 1 anterolisthesis and moderate foraminal narrowing bilaterally at L5-S1. IMPRESSION: 1. Distended gallbladder with mild wall thickening and surrounding inflammation suspicious for acute cholecystitis. No calcified gallstones or biliary dilatation. 2. No other acute findings or explanation for the patient's symptoms. 3. Distal colonic diverticulosis without evidence of acute inflammation. 4. Stable chronic nodular thickening along the lateral duodenal bulb. 5. Chronic bilateral L5 pars defects with associated grade 1 anterolisthesis and moderate foraminal narrowing bilaterally at L5-S1. 6. Previously demonstrated pulmonary nodularity is no longer clearly seen although may be obscured by increased atelectasis at both lung bases. 7.  Aortic Atherosclerosis (ICD10-I70.0). Electronically Signed   By: WRichardean SaleM.D.   On: 04/13/2022 11:31    Procedures Procedures    Medications Ordered in  ED Medications  sodium chloride (PF) 0.9 % injection (has no administration in time range)  HYDROmorphone (DILAUDID) injection 0.5 mg (has no administration in time range)  sodium chloride 0.9 % bolus 500 mL (0 mLs Intravenous Stopped 04/13/22 1506)  iohexol (OMNIPAQUE) 300 MG/ML solution 75 mL (75 mLs Intravenous Contrast Given 04/13/22 1103)  piperacillin-tazobactam (ZOSYN) IVPB 3.375 g (0 g Intravenous Stopped 04/13/22 1506)    ED Course/ Medical Decision Making/ A&P  CRITICAL CARE Performed by: JMilton FergusonTotal critical care time: 45 minutes Critical care time was exclusive of separately billable procedures and treating other patients. Critical care was necessary to treat or prevent imminent or life-threatening deterioration. Critical care was time spent personally by me on the following activities: development of treatment plan with patient and/or surrogate as well  as nursing, discussions with consultants, evaluation of patient's response to treatment, examination of patient, obtaining history from patient or surrogate, ordering and performing treatments and interventions, ordering and review of laboratory studies, ordering and review of radiographic studies, pulse oximetry and re-evaluation of patient's condition.                          Medical Decision Making Amount and/or Complexity of Data Reviewed Labs: ordered. Radiology: ordered.  Risk Prescription drug management. Decision regarding hospitalization.  This patient presents to the ED for concern of abdominal pain, this involves an extensive number of treatment options, and is a complaint that carries with it a high risk of complications and morbidity.  The differential diagnosis includes bowel obstruction, cholecystitis, colon cancer   Co morbidities that complicate the patient evaluation  Dementia   Additional history obtained:  Additional history obtained from wife External records from outside source obtained  and reviewed including hospital records   Lab Tests:  I Ordered, and personally interpreted labs.  The pertinent results include: wbc 15,000   Imaging Studies ordered:  I ordered imaging studies including CT abdomen that showed possible cholecystitis I independently visualized and interpreted imaging which showed ultrasound service cholecystitis I agree with the radiologist interpretation   Cardiac Monitoring: / EKG:  The patient was maintained on a cardiac monitor.  I personally viewed and interpreted the cardiac monitored which showed an underlying rhythm of: Normal sinus rhythm   Consultations Obtained:  I requested consultation with the general surgery and hospitalist,  and discussed lab and imaging findings as well as pertinent plan - they recommend: Admit for cholecystitis   Problem List / ED Course / Critical interventions / Medication management  Dementia and cholecystitis I ordered medication including antibiotics for cholecystitis Reevaluation of the patient after these medicines showed that the patient stayed the same I have reviewed the patients home medicines and have made adjustments as needed   Social Determinants of Health:  Patient is a DNR   Test / Admission - Considered:  No additional test right now.  Patient will be admitted  Cholecystitis.  He was seen by surgery and we will treat him with antibiotics for now.  Medicine will admit        Final Clinical Impression(s) / ED Diagnoses Final diagnoses:  Cholecystitis    Rx / DC Orders ED Discharge Orders     None         Milton Ferguson, MD 04/15/22 1015

## 2022-04-13 NOTE — H&P (Signed)
History and Physical    Kevin Lynch. HYQ:[id number] DOB: November 11, 1932 DOA: 04/13/2022  PCP: Kevin Reel, PA   Patient coming from: Home  Chief Complaint: Abdominal discomfort  HPI: Kevin Lynch. is a 88 y.o. male with medical history significant of advanced dementia, currently under hospice services, CVA, vascular dementia, hypertension, COPD, depression, diastolic CHF who presented from home with complaint of abdominal pain.  Patient lives with his wife at home and he has a caretaker at home. patient was accompanied by his wife.  As per the report, pain started yesterday with abdominal distention.  He was having diarrhea for the last few days.  He has history of chronic constipation, intermittent nausea at baseline.  No report of fever, nausea, vomiting.  Patient is currently under hospice care for advanced dementia. On presentation, he was hemodynamically stable.  Lab work showed creatinine of 1.4.  WBC count of 15.6.  CT abdomen/pelvis showed distended gallbladder with mild wall thickening and surrounding inflammation suspicious for acute cholecystitis.US abdomen showed cholelithiasis with gallbladder wall thickening and positive sonographic Murphy sign, suspicious for acute cholecystitis. General surgery consulted.  Due to his advanced dementia, he is not a good surgical candidate as per surgery and the plan is for conservative management with antibiotics. No reported fever, chills, shortness of breath, chest pain, dysuria, hematochezia or melena.  ED Course: Hypertensive during the ED stay .  Afebrile.  Started on IV fluids, antibiotics.  Blood cultures sent.  Review of Systems: As per HPI otherwise 10 point review of systems negative.    Past Medical History:  Diagnosis Date   Actinic keratosis    Anemia    Arthritis    Ataxic gait 06/09/2015   Basal cell carcinoma    skin - chest, back and face   BPH (benign prostatic hyperplasia) 06/09/2015   Cataracts, bilateral     removed by surgery   Cerebral vascular disease 06/09/2015   Chronic diarrhea 01/07/2018   Chronic gastric ulcer 06/10/2015   CKD (chronic kidney disease) 02/01/2018   Claudication, intermittent ([place]) 06/09/2015   COPD (chronic obstructive pulmonary disease) ([place])    COPD (chronic obstructive pulmonary disease) ([place]) 06/09/2015   Dementia ([place])    Depression    Diastolic CHF ([place]) 01/04/2840   Procedure narrative: Transthoracic echocardiography. Image   quality was adequate. Intravenous contrast (Definity) was   administered. - Left ventricle: The cavity size was normal. There was moderate   focal basal and mild concentric hypertrophy. Systolic function   was normal. The estimated ejection fraction was in the range of   60% to 65%. Wall motion was normal; there were no regional wall   m   Diastolic heart failure (HCC)    Diverticulitis    Diverticulitis    Enlarged thoracic aorta ([place]) 01/31/2018   Former smoker    quit 1983-smoked 30 yrs   Gastric ulcer    from 06/06/15: ULCER/GASTRITIS/ANEMIA:   Gastroesophageal reflux disease with esophagitis 06/10/2015   HDL lipoprotein deficiency    History of blood transfusion    History of stroke    Hyperglycemia    Hyperlipidemia    no meds   Hypertension    Hypertensive heart disease 06/09/2015   Lower extremity edema 06/09/2015   Male erectile dysfunction, unspecified 06/09/2015   Melanoma ([place])    left side of face   Mohs defect 12/01/2019   Open wound of face 11/10/2019   Physical deconditioning 07/04/2017   Pneumonia    x  1   Pre-diabetes    does not check blood sugar   Prediabetes    Recurrent major depressive disorder, in full remission ([place]) 06/09/2015   Reduced libido 06/09/2015   Restless leg syndrome    Simple chronic bronchitis ([place]) 07/04/2017   IgE >> 55 on 06/26/2017    Stroke [facility]) 2012   Vascular dementia ([place]) 06/09/2015   Vitamin B 12 deficiency 06/09/2015   Vitamin D deficiency 01/07/2018   Wears glasses     Past Surgical History:  Procedure  Laterality Date   ADJACENT TISSUE TRANSFER/TISSUE REARRANGEMENT N/A 12/01/2019   Procedure: Reconstruction of nasal defect with paramedian forehead flap and ear cartilage graft;  Surgeon: Cindra Presume, MD;  Location: [place];  Service: Plastics;  Laterality: N/A;   CATARACT EXTRACTION     COLONOSCOPY     DRUG INDUCED ENDOSCOPY     HEMORROIDECTOMY     LESION EXCISION WITH COMPLEX REPAIR Left 08/19/2019   Procedure: LEFT FACIAL MELANOMA EXCISION WITH COMPLEX REPAIR;  Surgeon: Izora Gala, MD;  Location: [place];  Service: ENT;  Laterality: Left;   LYMPH NODE BIOPSY Left 08/19/2019   Procedure: LEFT LEVEL THREE SENTINEL LYMPH NODE DISSECTION/BIOPSY;  Surgeon: Izora Gala, MD;  Location: [place];  Service: ENT;  Laterality: Left;   MELANOMA EXCISION     NASAL SEPTUM SURGERY     PAROTIDECTOMY Left 08/19/2019   LEFT SUPERFICIAL PAROTIDECTOMY, with facial nerve dissection   PAROTIDECTOMY Left 08/19/2019   Procedure: LEFT PAROTIDECTOMY;  Surgeon: Izora Gala, MD;  Location: [place];  Service: ENT;  Laterality: Left;   PROSTATE SURGERY     SENTINEL LYMPH NODE BIOPSY  08/19/2019   LEFT LEVEL THREE SENTINEL LYMPH NODE DISSECTION/BIOPSY   SKIN FULL THICKNESS GRAFT Left 08/19/2019   Procedure: SKIN GRAFT FULL THICKNESS;  Surgeon: Izora Gala, MD;  Location: [place];  Service: ENT;  Laterality: Left;   VASECTOMY       reports that he quit smoking about 40 years ago. His smoking use included cigarettes. He has a 15.00 pack-year smoking history. He has quit using smokeless tobacco.  His smokeless tobacco use included chew. He reports that he does not currently use alcohol. He reports that he does not use drugs.  Allergies  Allergen Reactions   Shellfish Allergy Hives and Shortness Of Breath   Codeine Nausea Only   Lisinopril Cough    memory difficulty     Family History  Problem Relation Age of Onset   Stroke Mother    Hypertension Mother    CVA Mother    Heart attack Mother    Hypertension Father     Stroke Father    Heart attack Father    Asthma Brother    Hypertension Brother    Heart attack Brother    Hyperlipidemia Brother      Prior to Admission medications   Medication Sig Start Date End Date Taking? Authorizing Provider  ACETAMINOPHEN PO Take by mouth.    [provider]  albuterol (PROVENTIL) (2.5 MG/3ML) 0.083% nebulizer solution Take 2.5 mg by nebulization every 6 (six) hours as needed for wheezing or shortness of breath.    [provider]  clopidogrel (PLAVIX) 75 MG tablet Take 75 mg by mouth daily.    [provider]  Coenzyme Q10 (COQ-10) 100 MG CAPS Take 100 mg by mouth daily.     [provider]  donepezil (ARICEPT) 10 MG tablet Take 10 mg by mouth at bedtime.  [provider]  escitalopram (LEXAPRO) 20 MG tablet Take 20 mg by mouth daily.     [provider]  gabapentin (NEURONTIN) 100 MG capsule Take 100 mg by mouth at bedtime.    [provider]  levocetirizine (XYZAL) 5 MG tablet Take 5 mg by mouth every morning.     [provider]  Loperamide HCl (IMODIUM PO) Take 2 mg by mouth daily as needed (upset stomach).     [provider]  memantine (NAMENDA) 10 MG tablet Take 10 mg by mouth 2 (two) times daily.  10/27/17   [provider]  metoprolol succinate (TOPROL-XL) 25 MG 24 hr tablet Take 12.5 mg by mouth daily.    [provider]  Omega-3 Fatty Acids (FISH OIL PO) Take by mouth daily.    [provider]  QUEtiapine (SEROQUEL) 25 MG tablet Take 25 mg by mouth at bedtime. 10/22/19   [provider]  spironolactone (ALDACTONE) 25 MG tablet Take 25 mg by mouth daily.    [provider]  terazosin (HYTRIN) 5 MG capsule Take 5 mg by mouth at bedtime.    [provider]  TRELEGY ELLIPTA 100-62.5-25 MCG/INH AEPB Inhale 1 puff into the lungs daily. 06/05/20   [provider]  valsartan (DIOVAN) 80 MG tablet Take 80 mg by mouth  daily.     [provider]    Physical Exam: Vitals:   04/13/22 1030 04/13/22 1130 04/13/22 1230 04/13/22 1403  BP: 134/73 132/85 124/82 124/76  Pulse: 97 95 99 96  Resp:   16 16  Temp:    (!) 97.4 F (36.3 C)  TempSrc:    Oral  SpO2: 94% 95% 97% 94%    Constitutional: Confused, lying in bed not agitated, overall comfortable Vitals:   04/13/22 1030 04/13/22 1130 04/13/22 1230 04/13/22 1403  BP: 134/73 132/85 124/82 124/76  Pulse: 97 95 99 96  Resp:   16 16  Temp:    (!) 97.4 F (36.3 C)  TempSrc:    Oral  SpO2: 94% 95% 97% 94%   Eyes: PERRL, lids and conjunctivae normal ENMT: Mucous membranes are moist.  Neck: normal, supple, no masses, no thyromegaly Respiratory: Mildly diminished air sounds bilaterally, no wheezing, no crackles. Normal respiratory effort. No accessory muscle use.  Cardiovascular: Regular rate and rhythm, no murmurs / rubs / gallops. No extremity edema.  Abdomen: Abdomen is distended, tenderness on the right upper quadrant, bowel sounds positive.  Musculoskeletal: no clubbing / cyanosis. No joint deformity upper and lower extremities.  Skin: no rashes, lesions, ulcers. No induration Neurologic: CN 2-12 grossly intact.  Psychiatric: Confused, oriented to self Foley Catheter:None  Labs on Admission: I have personally reviewed following labs and imaging studies  CBC: Recent Labs  Lab 04/13/22 0936  WBC 15.6*  HGB 14.6  HCT 46.1  MCV 96.0  PLT 092   Basic Metabolic Panel: Recent Labs  Lab 04/13/22 0936  NA 136  K 4.1  CL 102  CO2 23  GLUCOSE 197*  BUN 28*  CREATININE 1.43*  CALCIUM 9.0   GFR: CrCl cannot be calculated (Unknown ideal weight.). Liver Function Tests: Recent Labs  Lab 04/13/22 0936  AST 26  ALT 28  ALKPHOS 62  BILITOT 0.8  PROT 6.0*  ALBUMIN 2.9*   Recent Labs  Lab 04/13/22 0936  LIPASE 25   No results for input(s): "AMMONIA" in the last 168 hours. Coagulation Profile: No results for input(s):  "INR", "PROTIME" in the  last 168 hours. Cardiac Enzymes: No results for input(s): "CKTOTAL", "CKMB", "CKMBINDEX", "TROPONINI" in the last 168 hours. BNP (last 3 results) No results for input(s): "PROBNP" in the last 8760 hours. HbA1C: No results for input(s): "HGBA1C" in the last 72 hours. CBG: No results for input(s): "GLUCAP" in the last 168 hours. Lipid Profile: No results for input(s): "CHOL", "HDL", "LDLCALC", "TRIG", "CHOLHDL", "LDLDIRECT" in the last 72 hours. Thyroid Function Tests: No results for input(s): "TSH", "T4TOTAL", "FREET4", "T3FREE", "THYROIDAB" in the last 72 hours. Anemia Panel: No results for input(s): "VITAMINB12", "FOLATE", "FERRITIN", "TIBC", "IRON", "RETICCTPCT" in the last 72 hours. Urine analysis:    Component Value Date/Time   COLORURINE YELLOW 04/13/2022 [place] 04/13/2022 1418   LABSPEC 1.036 (H) 04/13/2022 1418   PHURINE 5.0 04/13/2022 1418   GLUCOSEU NEGATIVE 04/13/2022 1418   HGBUR NEGATIVE 04/13/2022 1418   BILIRUBINUR NEGATIVE 04/13/2022 1418   KETONESUR 5 (A) 04/13/2022 1418   PROTEINUR 30 (A) 04/13/2022 1418   NITRITE NEGATIVE 04/13/2022 1418   LEUKOCYTESUR NEGATIVE 04/13/2022 1418    Radiological Exams on Admission: US Abdomen Limited RUQ (LIVER/GB)  Result Date: 04/13/2022 CLINICAL DATA:  Abdominal pain. EXAM: ULTRASOUND ABDOMEN LIMITED RIGHT UPPER QUADRANT COMPARISON:  CT abdomen and pelvis 04/13/2022 FINDINGS: Gallbladder: Thickened gallbladder wall measuring 5 mm. 1 cm gallstone. A positive sonographic Percell Miller sign was reported by the sonographer. Common bile duct: Diameter: 3 mm Liver: Suboptimally assessed due to bowel gas with the known left hepatic lobe cysts on CT not clearly shown by ultrasound. Portal vein is patent on color Doppler imaging with normal direction of blood flow towards the liver. Other: None. IMPRESSION: Cholelithiasis with gallbladder wall thickening and positive sonographic Murphy sign, suspicious  for acute cholecystitis. Electronically Signed   By: Logan Bores M.D.   On: 04/13/2022 13:28   CT ABDOMEN PELVIS W CONTRAST  Result Date: 04/13/2022 CLINICAL DATA:  Abdominal pain, acute nonlocalized. Symptoms for 12 hours. EXAM: CT ABDOMEN AND PELVIS WITH CONTRAST TECHNIQUE: Multidetector CT imaging of the abdomen and pelvis was performed using the standard protocol following bolus administration of intravenous contrast. RADIATION DOSE REDUCTION: This exam was performed according to the departmental dose-optimization program which includes automated exposure control, adjustment of the mA and/or kV according to patient size and/or use of iterative reconstruction technique. CONTRAST:  26m OMNIPAQUE IOHEXOL 300 MG/ML  SOLN COMPARISON:  Abdominopelvic CT 08/27/2019.  PET-CT 06/13/2019. FINDINGS: Lower chest: Interval mildly increased dependent opacities at both lung bases, likely reflecting atelectasis. No confluent airspace disease, residual suspicious nodularity or significant pleural effusion demonstrated. The heart is enlarged. There is atherosclerosis of the aorta and coronary arteries. Hepatobiliary: The liver is normal in density without suspicious focal abnormality. There are unchanged cysts within the left hepatic lobe. The gallbladder is mildly distended with mild wall thickening and surrounding inflammation. No calcified gallstones. No intra or extrahepatic biliary dilatation. Pancreas: Unremarkable. No pancreatic ductal dilatation or surrounding inflammatory changes. Spleen: Normal in size without focal abnormality. Adrenals/Urinary Tract: Both adrenal glands appear normal. No evidence of urinary tract calculus, suspicious renal lesion or hydronephrosis. Unchanged simple right renal cysts for which no follow-up imaging is recommended. The bladder appears unremarkable for its degree of distention. Stomach/Bowel: No enteric contrast administered. The stomach appears unremarkable for its degree of  distention. Chronic nodular thickening along the lateral duodenal bulb measuring 1.6 x 1.2 cm on image 31/2 is unchanged. No evidence of bowel distension, wall thickening or surrounding inflammation. The appendix appears normal. Mildly  prominent stool throughout the colon. There are diverticular changes in the descending and sigmoid colon. Vascular/Lymphatic: There are no enlarged abdominal or pelvic lymph nodes. Aortic and branch vessel atherosclerosis without evidence of aneurysm or large vessel occlusion. The portal, superior mesenteric and splenic veins are patent. Reproductive: Stable mild prostatomegaly. Other: No evidence of abdominal wall mass or hernia. No ascites. Musculoskeletal: No acute or significant osseous findings. Chronic bilateral L5 pars defects with associated grade 1 anterolisthesis and moderate foraminal narrowing bilaterally at L5-S1. IMPRESSION: 1. Distended gallbladder with mild wall thickening and surrounding inflammation suspicious for acute cholecystitis. No calcified gallstones or biliary dilatation. 2. No other acute findings or explanation for the patient's symptoms. 3. Distal colonic diverticulosis without evidence of acute inflammation. 4. Stable chronic nodular thickening along the lateral duodenal bulb. 5. Chronic bilateral L5 pars defects with associated grade 1 anterolisthesis and moderate foraminal narrowing bilaterally at L5-S1. 6. Previously demonstrated pulmonary nodularity is no longer clearly seen although may be obscured by increased atelectasis at both lung bases. 7.  Aortic Atherosclerosis (ICD10-I70.0). Electronically Signed   By: Richardean Sale M.D.   On: 04/13/2022 11:31     Assessment/Plan Principal Problem:   Acute cholecystitis Active Problems:   Diastolic CHF (HCC)   COPD (chronic obstructive pulmonary disease) (HCC)   Vascular dementia (HCC)   Hypertension  Acute cholecystitis: Imaging showed cholelithiasis with gallbladder inflammation consistent  with acute cholecystitis.  General surgery following.  Due to his advanced age and dementia, surgery not recommended.  He might need cholecystectomy drain as per IR  but currently on hold because he was on plavix. General surgery will determine when to consult IR for need of cholecystostomy. Antibiotics started.  Follow-up cultures.  Currently NPO.  Continue IV fluids Has mild leukocytosis  CKD stage 3a: Baseline creatinine range of 1.6-1.7.Currently  kidney function at baseline   history of COPD: Currently not on exacerbation.  Continue home inhaler.  As needed bronchodilators.  Not on oxygen at home.  Currently on 2 L of oxygen for comfort  Hypertension: Takes metoprolol, ARB, spironolactone at home.  Currently hypertensive, will resume this medications  Diastolic CHF: Continue IV fluids for now as patient is n.p.o.  Currently looks euvolemic.  Last echo showed EF of 58%, grade 1 diastolic dysfunction  History of stroke/vascular dementia: Patient has dementia at baseline.  Remains confused.  Alert and oriented to self only.  Takes memantine, donepezil at home which have been restarted.  Also on Seroquel at bedtime.  Continue delirium precautions  BPH: On terazosin  History of depression: On Celexa  Goals of care: CODE STATUS DNR, confirmed with wife.  Patient is under hospice services at home. Elderly patient with dementia presented with acute cholecystitis.  Not a candidate surgery.  High risk for decompensation.  Palliative care consulted      Severity of Illness: The appropriate patient status for this patient is INPATIENT.   DVT prophylaxis: Lovenox Code Status: DNR Family Communication: Called and discussed with wife on phone today Consults called: General surgery     Shelly Coss MD [facility]  04/13/2022, 3:55 PM

## 2022-04-13 NOTE — ED Provider Notes (Signed)
Mse note.   Patient has a history of significant dementia and is on hospice.  His wife states that he started yesterday with abdominal discomfort.  She also states that his abdomen is very distended.  Sometimes he gets constipated.  She says that the patient is a DNR and is on hospice for the dementia.  Physical exam patient is alert and oriented to person only.  Abdomen distended and tender throughout.  Labs and CT scan ordered.   Milton Ferguson, MD 04/13/22 [phone]

## 2022-04-13 NOTE — Progress Notes (Signed)
   This pt is active with hospice services at home with a hospice diagnosis of cerebral vascular disease and vascular dementia. Pt has some confusion ant home and inability to tell that TV isn't reality. He is incontinent of bowel and bladder at baseline and can transfer with assist of one to chair.  This hospitalization is not related to his primary hospice diagnosis.   Webb Silversmith RN [phone]

## 2022-04-13 NOTE — ED Notes (Signed)
Unable to obtain oral temp 

## 2022-04-13 NOTE — ED Triage Notes (Addendum)
Pt BIB [facility] with reports of abdominal pain x 12hrs. Pt received 164m fluid and '4mg'$  zofran en route. Pt hospice pt. Pt received '5mg'$  morphine and 0.5 and ativan and 0500 this morning.

## 2022-04-13 NOTE — Consult Note (Addendum)
Consult Note  Kevin Lynch. 03-20-1933  [id number].    Requesting MD: Milton Ferguson, MD Chief Complaint/Reason for Consult:Acute cholecystitis  HPI:  Patient is an 88 year old male who presented to [facility] with abdominal pain. Pain started yesterday and abdomen also noted to be distended. Reports chronic constipation and intermittent nausea at baseline as well. No nausea or emesis. History provided by patient's wife - he has dementia and is alert and oriented to self only. Currently on hospice for dementia.   PMH otherwise significant for Hx of CVA on plavix, Hx of melanoma, COPD, Chronic diastolic CHF, HTN, HLD, CKD, Peripheral vascular disease, GERD, Vascular dementia , Hx of gastric ulcer , BPH, Anemia of chronic disease. Prior prostate surgery. Takes plavix at baseline, last dose was this am.   No prior abdominal surgeries  ROS: ROS reviewed and as above  Family History  Problem Relation Age of Onset   Stroke Mother    Hypertension Mother    CVA Mother    Heart attack Mother    Hypertension Father    Stroke Father    Heart attack Father    Asthma Brother    Hypertension Brother    Heart attack Brother    Hyperlipidemia Brother     Past Medical History:  Diagnosis Date   Actinic keratosis    Anemia    Arthritis    Ataxic gait 06/09/2015   Basal cell carcinoma    skin - chest, back and face   BPH (benign prostatic hyperplasia) 06/09/2015   Cataracts, bilateral    removed by surgery   Cerebral vascular disease 06/09/2015   Chronic diarrhea 01/07/2018   Chronic gastric ulcer 06/10/2015   CKD (chronic kidney disease) 02/01/2018   Claudication, intermittent ([place]) 06/09/2015   COPD (chronic obstructive pulmonary disease) ([place])    COPD (chronic obstructive pulmonary disease) ([place]) 06/09/2015   Dementia ([place])    Depression    Diastolic CHF ([place]) 06/01/3084   Procedure narrative: Transthoracic echocardiography. Image   quality was adequate. Intravenous contrast (Definity)  was   administered. - Left ventricle: The cavity size was normal. There was moderate   focal basal and mild concentric hypertrophy. Systolic function   was normal. The estimated ejection fraction was in the range of   60% to 65%. Wall motion was normal; there were no regional wall   m   Diastolic heart failure (HCC)    Diverticulitis    Diverticulitis    Enlarged thoracic aorta ([place]) 01/31/2018   Former smoker    quit 1983-smoked 30 yrs   Gastric ulcer    from 06/06/15: ULCER/GASTRITIS/ANEMIA:   Gastroesophageal reflux disease with esophagitis 06/10/2015   HDL lipoprotein deficiency    History of blood transfusion    History of stroke    Hyperglycemia    Hyperlipidemia    no meds   Hypertension    Hypertensive heart disease 06/09/2015   Lower extremity edema 06/09/2015   Male erectile dysfunction, unspecified 06/09/2015   Melanoma ([place])    left side of face   Mohs defect 12/01/2019   Open wound of face 11/10/2019   Physical deconditioning 07/04/2017   Pneumonia    x 1   Pre-diabetes    does not check blood sugar   Prediabetes    Recurrent major depressive disorder, in full remission ([place]) 06/09/2015   Reduced libido 06/09/2015   Restless leg syndrome    Simple chronic bronchitis ([place]) 07/04/2017   IgE >>  55 on 06/26/2017    Stroke [facility]) 2012   Vascular dementia ([place]) 06/09/2015   Vitamin B 12 deficiency 06/09/2015   Vitamin D deficiency 01/07/2018   Wears glasses     Past Surgical History:  Procedure Laterality Date   ADJACENT TISSUE TRANSFER/TISSUE REARRANGEMENT N/A 12/01/2019   Procedure: Reconstruction of nasal defect with paramedian forehead flap and ear cartilage graft;  Surgeon: Cindra Presume, MD;  Location: [place];  Service: Plastics;  Laterality: N/A;   CATARACT EXTRACTION     COLONOSCOPY     DRUG INDUCED ENDOSCOPY     HEMORROIDECTOMY     LESION EXCISION WITH COMPLEX REPAIR Left 08/19/2019   Procedure: LEFT FACIAL MELANOMA EXCISION WITH COMPLEX REPAIR;  Surgeon: Izora Gala, MD;   Location: [place];  Service: ENT;  Laterality: Left;   LYMPH NODE BIOPSY Left 08/19/2019   Procedure: LEFT LEVEL THREE SENTINEL LYMPH NODE DISSECTION/BIOPSY;  Surgeon: Izora Gala, MD;  Location: [place];  Service: ENT;  Laterality: Left;   MELANOMA EXCISION     NASAL SEPTUM SURGERY     PAROTIDECTOMY Left 08/19/2019   LEFT SUPERFICIAL PAROTIDECTOMY, with facial nerve dissection   PAROTIDECTOMY Left 08/19/2019   Procedure: LEFT PAROTIDECTOMY;  Surgeon: Izora Gala, MD;  Location: [place];  Service: ENT;  Laterality: Left;   PROSTATE SURGERY     SENTINEL LYMPH NODE BIOPSY  08/19/2019   LEFT LEVEL THREE SENTINEL LYMPH NODE DISSECTION/BIOPSY   SKIN FULL THICKNESS GRAFT Left 08/19/2019   Procedure: SKIN GRAFT FULL THICKNESS;  Surgeon: Izora Gala, MD;  Location: [place];  Service: ENT;  Laterality: Left;   VASECTOMY      Social History:  reports that he quit smoking about 40 years ago. His smoking use included cigarettes. He has a 15.00 pack-year smoking history. He has quit using smokeless tobacco.  His smokeless tobacco use included chew. He reports that he does not currently use alcohol. He reports that he does not use drugs.  Allergies:  Allergies  Allergen Reactions   Shellfish Allergy Hives and Shortness Of Breath   Codeine Nausea Only   Lisinopril Cough    memory difficulty     (Not in a hospital admission)   Blood pressure 124/76, pulse 96, temperature (!) 97.4 F (36.3 C), temperature source Oral, resp. rate 16, SpO2 94 %. Physical Exam:  General: pleasant, WD,  male who is laying in bed in NAD HEENT: head is normocephalic, atraumatic.  Sclera are noninjected.  Mouth is pink and moist Lungs: Respiratory effort nonlabored on room air Abd: soft, moderately distended, diffusely TTP but greatest in RUQ MS: all 4 extremities are symmetrical with no cyanosis, clubbing, or edema. Skin: warm and dry with no masses, lesions, or rashes Neuro: Cranial nerves 2-12 grossly intact, sensation is  normal throughout Psych: Alert and at baseline   Results for orders placed or performed during the hospital encounter of 04/13/22 (from the past 48 hour(s))  Lipase, blood     Status: None   Collection Time: 04/13/22  9:36 AM  Result Value Ref Range   Lipase 25 11 - 51 U/L    Comment: Performed at [facility], [place] [address] [place], [place] [address]  Comprehensive metabolic panel     Status: Abnormal   Collection Time: 04/13/22  9:36 AM  Result Value Ref Range   Sodium 136 135 - 145 mmol/L   Potassium 4.1 3.5 - 5.1 mmol/L   Chloride 102 98 - 111 mmol/L  CO2 23 22 - 32 mmol/L   Glucose, Bld 197 (H) 70 - 99 mg/dL    Comment: Glucose reference range applies only to samples taken after fasting for at least 8 hours.   BUN 28 (H) 8 - 23 mg/dL   Creatinine, Ser 1.43 (H) 0.61 - 1.24 mg/dL   Calcium 9.0 8.9 - 10.3 mg/dL   Total Protein 6.0 (L) 6.5 - 8.1 g/dL   Albumin 2.9 (L) 3.5 - 5.0 g/dL   AST 26 15 - 41 U/L   ALT 28 0 - 44 U/L   Alkaline Phosphatase 62 38 - 126 U/L   Total Bilirubin 0.8 0.3 - 1.2 mg/dL   GFR, Estimated 47 (L) >60 mL/min    Comment: (NOTE) Calculated using the CKD-EPI Creatinine Equation (2021)    Anion gap 11 5 - 15    Comment: Performed at [facility], [place] [address], [place], [place] [address]  CBC     Status: Abnormal   Collection Time: 04/13/22  9:36 AM  Result Value Ref Range   WBC 15.6 (H) 4.0 - 10.5 K/uL   RBC 4.80 4.22 - 5.81 MIL/uL   Hemoglobin 14.6 13.0 - 17.0 g/dL   HCT 46.1 39.0 - 52.0 %   MCV 96.0 80.0 - 100.0 fL   MCH 30.4 26.0 - 34.0 pg   MCHC 31.7 30.0 - 36.0 g/dL   RDW 11.9 11.5 - 15.5 %   Platelets 264 150 - 400 K/uL   nRBC 0.0 0.0 - 0.2 %    Comment: Performed at [facility], [place] [address], [place], [place] [address]  Urinalysis, Routine w reflex microscopic Urine, Clean Catch     Status: Abnormal   Collection Time: 04/13/22  2:18 PM  Result Value Ref Range   Color, Urine  YELLOW YELLOW   APPearance CLEAR CLEAR   Specific Gravity, Urine 1.036 (H) 1.005 - 1.030   pH 5.0 5.0 - 8.0   Glucose, UA NEGATIVE NEGATIVE mg/dL   Hgb urine dipstick NEGATIVE NEGATIVE   Bilirubin Urine NEGATIVE NEGATIVE   Ketones, ur 5 (A) NEGATIVE mg/dL   Protein, ur 30 (A) NEGATIVE mg/dL   Nitrite NEGATIVE NEGATIVE   Leukocytes,Ua NEGATIVE NEGATIVE   RBC / HPF 0-5 0 - 5 RBC/hpf   WBC, UA 0-5 0 - 5 WBC/hpf   Bacteria, UA NONE SEEN NONE SEEN   Squamous Epithelial / LPF 0-5 0 - 5    Comment: Performed at [facility], [place] [address] [place], [place] [address]   US Abdomen Limited RUQ (LIVER/GB)  Result Date: 04/13/2022 CLINICAL DATA:  Abdominal pain. EXAM: ULTRASOUND ABDOMEN LIMITED RIGHT UPPER QUADRANT COMPARISON:  CT abdomen and pelvis 04/13/2022 FINDINGS: Gallbladder: Thickened gallbladder wall measuring 5 mm. 1 cm gallstone. A positive sonographic Percell Miller sign was reported by the sonographer. Common bile duct: Diameter: 3 mm Liver: Suboptimally assessed due to bowel gas with the known left hepatic lobe cysts on CT not clearly shown by ultrasound. Portal vein is patent on color Doppler imaging with normal direction of blood flow towards the liver. Other: None. IMPRESSION: Cholelithiasis with gallbladder wall thickening and positive sonographic Murphy sign, suspicious for acute cholecystitis. Electronically Signed   By: Logan Bores M.D.   On: 04/13/2022 13:28   CT ABDOMEN PELVIS W CONTRAST  Result Date: 04/13/2022 CLINICAL DATA:  Abdominal pain, acute nonlocalized. Symptoms for 12 hours. EXAM: CT ABDOMEN AND PELVIS WITH CONTRAST TECHNIQUE: Multidetector CT imaging of the abdomen and  pelvis was performed using the standard protocol following bolus administration of intravenous contrast. RADIATION DOSE REDUCTION: This exam was performed according to the departmental dose-optimization program which includes automated exposure control, adjustment of the mA and/or kV  according to patient size and/or use of iterative reconstruction technique. CONTRAST:  48m OMNIPAQUE IOHEXOL 300 MG/ML  SOLN COMPARISON:  Abdominopelvic CT 08/27/2019.  PET-CT 06/13/2019. FINDINGS: Lower chest: Interval mildly increased dependent opacities at both lung bases, likely reflecting atelectasis. No confluent airspace disease, residual suspicious nodularity or significant pleural effusion demonstrated. The heart is enlarged. There is atherosclerosis of the aorta and coronary arteries. Hepatobiliary: The liver is normal in density without suspicious focal abnormality. There are unchanged cysts within the left hepatic lobe. The gallbladder is mildly distended with mild wall thickening and surrounding inflammation. No calcified gallstones. No intra or extrahepatic biliary dilatation. Pancreas: Unremarkable. No pancreatic ductal dilatation or surrounding inflammatory changes. Spleen: Normal in size without focal abnormality. Adrenals/Urinary Tract: Both adrenal glands appear normal. No evidence of urinary tract calculus, suspicious renal lesion or hydronephrosis. Unchanged simple right renal cysts for which no follow-up imaging is recommended. The bladder appears unremarkable for its degree of distention. Stomach/Bowel: No enteric contrast administered. The stomach appears unremarkable for its degree of distention. Chronic nodular thickening along the lateral duodenal bulb measuring 1.6 x 1.2 cm on image 31/2 is unchanged. No evidence of bowel distension, wall thickening or surrounding inflammation. The appendix appears normal. Mildly prominent stool throughout the colon. There are diverticular changes in the descending and sigmoid colon. Vascular/Lymphatic: There are no enlarged abdominal or pelvic lymph nodes. Aortic and branch vessel atherosclerosis without evidence of aneurysm or large vessel occlusion. The portal, superior mesenteric and splenic veins are patent. Reproductive: Stable mild prostatomegaly.  Other: No evidence of abdominal wall mass or hernia. No ascites. Musculoskeletal: No acute or significant osseous findings. Chronic bilateral L5 pars defects with associated grade 1 anterolisthesis and moderate foraminal narrowing bilaterally at L5-S1. IMPRESSION: 1. Distended gallbladder with mild wall thickening and surrounding inflammation suspicious for acute cholecystitis. No calcified gallstones or biliary dilatation. 2. No other acute findings or explanation for the patient's symptoms. 3. Distal colonic diverticulosis without evidence of acute inflammation. 4. Stable chronic nodular thickening along the lateral duodenal bulb. 5. Chronic bilateral L5 pars defects with associated grade 1 anterolisthesis and moderate foraminal narrowing bilaterally at L5-S1. 6. Previously demonstrated pulmonary nodularity is no longer clearly seen although may be obscured by increased atelectasis at both lung bases. 7.  Aortic Atherosclerosis (ICD10-I70.0). Electronically Signed   By: WRichardean SaleM.D.   On: 04/13/2022 11:31      Assessment/Plan Acute cholecystitis  - RUQ [place] cholelithiasis, gallbladder wall thickening and positive sonographic murphy sign - WBC 15, LFTs not elevated, afebrile and HD stable  - exam and hx consistent with above - given patient's current medical status and goals of care of maximizing quality of life do not think surgery is the best option for him and would ultimately worsen his baseline mental and functional status. Risks and benefits of surgery discussed with wife and she is in agreement. Recommend bowel rest and antibiotics and monitor response with understanding he does have a risk for recurrence. May consider a percutaneous cholecystostomy if he does not improve.  FEN: npo/ice chips VTE: okay for chemical ppx. Recommend holding plavix ID: [id number]  - below per TRH -  Hx of CVA on plavix - LD am 12/14 Hx of melanoma  COPD Chronic diastolic CHF HTN HLD  CKD Peripheral  vascular disease GERD Vascular dementia  Hx of gastric ulcer  BPH Anemia of chronic disease   I reviewed ED provider notes, last 24 h vitals and pain scores, last 48 h intake and output, last 24 h labs and trends, and last 24 h imaging results.    Richard Miu, [facility] 04/13/2022, 2:42 PM Please see Amion for pager number during day hours 7:00am-4:30pm

## 2022-04-14 ENCOUNTER — Inpatient Hospital Stay (HOSPITAL_COMMUNITY): Payer: Medicare Other

## 2022-04-14 ENCOUNTER — Encounter (HOSPITAL_COMMUNITY): Payer: Self-pay | Admitting: Internal Medicine

## 2022-04-14 DIAGNOSIS — Z7189 Other specified counseling: Secondary | ICD-10-CM | POA: Diagnosis not present

## 2022-04-14 DIAGNOSIS — Z515 Encounter for palliative care: Secondary | ICD-10-CM | POA: Diagnosis not present

## 2022-04-14 DIAGNOSIS — R531 Weakness: Secondary | ICD-10-CM

## 2022-04-14 DIAGNOSIS — K810 Acute cholecystitis: Secondary | ICD-10-CM

## 2022-04-14 DIAGNOSIS — A419 Sepsis, unspecified organism: Secondary | ICD-10-CM | POA: Diagnosis present

## 2022-04-14 HISTORY — PX: IR PERC CHOLECYSTOSTOMY: IMG2326

## 2022-04-14 LAB — COMPREHENSIVE METABOLIC PANEL
ALT: 23 U/L (ref 0–44)
AST: 18 U/L (ref 15–41)
Albumin: 2.9 g/dL — ABNORMAL LOW (ref 3.5–5.0)
Alkaline Phosphatase: 55 U/L (ref 38–126)
Anion gap: 10 (ref 5–15)
BUN: 32 mg/dL — ABNORMAL HIGH (ref 8–23)
CO2: 24 mmol/L (ref 22–32)
Calcium: 8.5 mg/dL — ABNORMAL LOW (ref 8.9–10.3)
Chloride: 103 mmol/L (ref 98–111)
Creatinine, Ser: 1.8 mg/dL — ABNORMAL HIGH (ref 0.61–1.24)
GFR, Estimated: 36 mL/min — ABNORMAL LOW (ref >60)
Glucose, Bld: 129 mg/dL — ABNORMAL HIGH (ref 70–99)
Potassium: 5.4 mmol/L — ABNORMAL HIGH (ref 3.5–5.1)
Sodium: 137 mmol/L (ref 135–145)
Total Bilirubin: 0.4 mg/dL (ref 0.3–1.2)
Total Protein: 6 g/dL — ABNORMAL LOW (ref 6.5–8.1)

## 2022-04-14 LAB — CBC
HCT: 43.2 % (ref 39.0–52.0)
Hemoglobin: 13.9 g/dL (ref 13.0–17.0)
MCH: 30.8 pg (ref 26.0–34.0)
MCHC: 32.2 g/dL (ref 30.0–36.0)
MCV: 95.6 fL (ref 80.0–100.0)
Platelets: 290 10*3/uL (ref 150–400)
RBC: 4.52 MIL/uL (ref 4.22–5.81)
RDW: 12.3 % (ref 11.5–15.5)
WBC: 15.9 10*3/uL — ABNORMAL HIGH (ref 4.0–10.5)
nRBC: 0 % (ref 0.0–0.2)

## 2022-04-14 LAB — MRSA NEXT GEN BY PCR, NASAL: MRSA by PCR Next Gen: NOT DETECTED

## 2022-04-14 LAB — CBG MONITORING, ED: Glucose-Capillary: 127 mg/dL — ABNORMAL HIGH (ref 70–99)

## 2022-04-14 LAB — PROTIME-INR
INR: 1.4 — ABNORMAL HIGH (ref 0.8–1.2)
Prothrombin Time: 16.6 seconds — ABNORMAL HIGH (ref 11.4–15.2)

## 2022-04-14 LAB — LACTIC ACID, PLASMA
Lactic Acid, Venous: 2.3 mmol/L (ref 0.5–1.9)
Lactic Acid, Venous: 2.6 mmol/L (ref 0.5–1.9)

## 2022-04-14 LAB — PROCALCITONIN: Procalcitonin: 0.5 ng/mL

## 2022-04-14 MED ORDER — CHLORHEXIDINE GLUCONATE CLOTH 2 % EX PADS
6.0000 | MEDICATED_PAD | Freq: Every day | CUTANEOUS | Status: DC
  Administered 2022-04-14 – 2022-04-16 (×3): 6 via TOPICAL

## 2022-04-14 MED ORDER — MIDAZOLAM HCL 2 MG/2ML IJ SOLN
INTRAMUSCULAR | Status: AC
  Filled 2022-04-14: qty 2

## 2022-04-14 MED ORDER — LORAZEPAM 2 MG/ML IJ SOLN: 0.5000 mg | INTRAMUSCULAR | Status: DC | PRN

## 2022-04-14 MED ORDER — SODIUM CHLORIDE 0.9% FLUSH
5.0000 mL | Freq: Three times a day (TID) | INTRAVENOUS | Status: DC
  Administered 2022-04-14 – 2022-04-18 (×9): 5 mL

## 2022-04-14 MED ORDER — ORAL CARE MOUTH RINSE: 15.0000 mL | OROMUCOSAL | Status: DC | PRN

## 2022-04-14 MED ORDER — SODIUM CHLORIDE 0.9 % IV BOLUS
1000.0000 mL | Freq: Once | INTRAVENOUS | Status: AC
  Administered 2022-04-14: 1000 mL via INTRAVENOUS

## 2022-04-14 MED ORDER — HYDROMORPHONE HCL 1 MG/ML IJ SOLN
1.0000 mg | Freq: Once | INTRAMUSCULAR | Status: AC
  Administered 2022-04-14: 1 mg via INTRAVENOUS
  Filled 2022-04-14: qty 1

## 2022-04-14 MED ORDER — LIDOCAINE HCL 1 % IJ SOLN
INTRAMUSCULAR | Status: AC
  Filled 2022-04-14: qty 20

## 2022-04-14 MED ORDER — MIDAZOLAM HCL 2 MG/2ML IJ SOLN
INTRAMUSCULAR | Status: AC | PRN
  Administered 2022-04-14: .5 mg via INTRAVENOUS

## 2022-04-14 MED ORDER — ENOXAPARIN SODIUM 30 MG/0.3ML IJ SOSY
30.0000 mg | PREFILLED_SYRINGE | INTRAMUSCULAR | Status: DC
  Filled 2022-04-14: qty 0.3

## 2022-04-14 MED ORDER — LACTATED RINGERS IV BOLUS: 500.0000 mL | Freq: Once | INTRAVENOUS | Status: DC

## 2022-04-14 MED ORDER — LACTATED RINGERS IV BOLUS
500.0000 mL | Freq: Once | INTRAVENOUS | Status: AC
  Administered 2022-04-14: 500 mL via INTRAVENOUS

## 2022-04-14 MED ORDER — LORAZEPAM 2 MG/ML IJ SOLN
0.5000 mg | Freq: Four times a day (QID) | INTRAMUSCULAR | Status: DC | PRN
  Administered 2022-04-16: 0.5 mg via INTRAVENOUS
  Filled 2022-04-14: qty 1

## 2022-04-14 MED ORDER — IOHEXOL 300 MG/ML  SOLN
50.0000 mL | Freq: Once | INTRAMUSCULAR | Status: AC | PRN
  Administered 2022-04-14: 5 mL

## 2022-04-14 MED ORDER — FENTANYL CITRATE (PF) 100 MCG/2ML IJ SOLN
INTRAMUSCULAR | Status: AC
  Filled 2022-04-14: qty 2

## 2022-04-14 MED ORDER — LIDOCAINE HCL 1 % IJ SOLN
10.0000 mL | Freq: Once | INTRAMUSCULAR | Status: AC
  Administered 2022-04-14: 10 mL

## 2022-04-14 MED ORDER — FENTANYL CITRATE (PF) 100 MCG/2ML IJ SOLN
INTRAMUSCULAR | Status: AC | PRN
  Administered 2022-04-14 (×2): 25 ug via INTRAVENOUS

## 2022-04-14 NOTE — ED Notes (Signed)
Woke patient to obtain morning blood work and administer antibiotic.  Patient denies pain but admits to nausea.  MD has been messaged regarding patient complaint

## 2022-04-14 NOTE — Consult Note (Signed)
NAME:  Kevin Nickolson., MRN:  [id number], DOB:  13-May-1932, LOS: 1 ADMISSION DATE:  04/13/2022, CONSULTATION DATE:  04/14/22 REFERRING MD:  Tawanna Solo - [facility], CHIEF COMPLAINT:  abd pain // hypotension   History of Present Illness:  88 yo M on hospice care with adv dementia, hx CVA, COPD, diastolic HF presented to ED 12/14 w abd pain. Found to have cholecystitis. Ccs consulted, admitted to [facility] -- poor surgical candidate and conservative mgmnt with abx was initiated.  On 12/15, pt had worse pain, softer Bps which in part seem to coincide w dilaudid administration but hypotension r/t sepsis cannot be excluded. With concern for worsening sepsis and possibly developing septic shock, CCS recommends perc chole. measures such as pressors were discussed with wife and primary team, who would accept these interventions.  PCCM is consulted in this setting   Pertinent  Medical History  DNR status Hospice care Advanced vascular dementia  Diastolic HF CKD  HLD HTN  Significant Hospital Events: Including procedures, antibiotic start and stop dates in addition to other pertinent events   12/14 ED w abd pain, found to have acute chole. Started ona bx, admit to [facility], CCS consulted but poor surgical candidate 12/15 softer pressures, more pain. CCS rec IR perc chole. PCCM consult for softer pressures   Interim History / Subjective:  SBPs 100s w IVF  Objective   Blood pressure 98/67, pulse (!) 105, temperature 100.1 F (37.8 C), temperature source Rectal, resp. rate 14, height '5\' 3"'$  (1.6 m), weight 77.1 kg, SpO2 95 %.        Intake/Output Summary (Last 24 hours) at 04/14/2022 0838 Last data filed at 04/14/2022 7673 Gross per 24 hour  Intake 1650 ml  Output --  Net 1650 ml   Filed Weights   04/13/22 1723  Weight: 77.1 kg    Examination: General: chronically ill elderly M NAD HENT: NCAT pink mm Polo in place Lungs: CTAb even unlabored, slightly incr RR  Cardiovascular: tachycardic, cap refill  < 3 sec  Abdomen: distended. Denies pain but definite RUQ pain to light palpation  Extremities: no acute joint deformity Neuro: Lethargic, awake, disoriented  GU: defer  Resolved Hospital Problem list     Assessment & Plan:   Acute cholecystitis  Sepsis due to above Pain due to above Borderline hypotension due to sepsis +/- pain medication  DNR status Hospice care patient Advanced dementia  Hx CVA Hx COPD Hx diastolic HF CKD 3a Hyperkalemia  P -ok to admit to stepdown w [facility] -does not need pressors at this time - Cont IVF-- repeat bolus will be more effective than mIVF at impacting volume status and CO. could consider colloid infusion in addition. -if drops and needs pressors, would d/w and likely recommend limiting to peripheral pressors. -CCS is following and recs perc chole with IR -cont abx -RN to attempt NGT placement-- I do worry pt may not tolerate this well (seems to have a lot of fearful responses at time of my exam) and might be something to consider in IR if not able to do in ED -- defer to primary and CCS -would rec to restart home namenda and aricept when able    Best Practice (right click and "Reselect all SmartList Selections" daily)   Diet/type: NPO DVT prophylaxis: LMWH GI prophylaxis: N/A Lines: N/A Foley:  N/A Code Status:  DNR Last date of multidisciplinary goals of care discussion [per primary]  Labs   CBC: Recent Labs  Lab 04/13/22 0936 04/14/22  0421  WBC 15.6* 15.9*  HGB 14.6 13.9  HCT 46.1 43.2  MCV 96.0 95.6  PLT 264 828    Basic Metabolic Panel: Recent Labs  Lab 04/13/22 0936 04/14/22 0421  NA 136 137  K 4.1 5.4*  CL 102 103  CO2 23 24  GLUCOSE 197* 129*  BUN 28* 32*  CREATININE 1.43* 1.80*  CALCIUM 9.0 8.5*   GFR: Estimated Creatinine Clearance: 26.6 mL/min (A) (by C-G formula based on SCr of 1.8 mg/dL (H)). Recent Labs  Lab 04/13/22 0936 04/14/22 0421  WBC 15.6* 15.9*    Liver Function Tests: Recent Labs  Lab  04/13/22 0936 04/14/22 0421  AST 26 18  ALT 28 23  ALKPHOS 62 55  BILITOT 0.8 0.4  PROT 6.0* 6.0*  ALBUMIN 2.9* 2.9*   Recent Labs  Lab 04/13/22 0936  LIPASE 25   No results for input(s): "AMMONIA" in the last 168 hours.  ABG No results found for: "PHART", "PCO2ART", "PO2ART", "HCO3", "TCO2", "ACIDBASEDEF", "O2SAT"   Coagulation Profile: No results for input(s): "INR", "PROTIME" in the last 168 hours.  Cardiac Enzymes: No results for input(s): "CKTOTAL", "CKMB", "CKMBINDEX", "TROPONINI" in the last 168 hours.  HbA1C: No results found for: "HGBA1C"  CBG: Recent Labs  Lab 04/14/22 0634  GLUCAP 127*    Review of Systems:   Unable to obtain due to advanced dementia   Past Medical History:  He,  has a past medical history of Actinic keratosis, Anemia, Arthritis, Ataxic gait (06/09/2015), Basal cell carcinoma, BPH (benign prostatic hyperplasia) (06/09/2015), Cataracts, bilateral, Cerebral vascular disease (06/09/2015), Chronic diarrhea (01/07/2018), Chronic gastric ulcer (06/10/2015), CKD (chronic kidney disease) (02/01/2018), Claudication, intermittent ([place]) (06/09/2015), COPD (chronic obstructive pulmonary disease) ([place]), COPD (chronic obstructive pulmonary disease) ([place]) (06/09/2015), Dementia ([place]), Depression, Diastolic CHF ([place]) (0/0/3491), Diastolic heart failure ([place]), Diverticulitis, Diverticulitis, Enlarged thoracic aorta ([place]) (01/31/2018), Former smoker, Gastric ulcer, Gastroesophageal reflux disease with esophagitis (06/10/2015), HDL lipoprotein deficiency, History of blood transfusion, History of stroke, Hyperglycemia, Hyperlipidemia, Hypertension, Hypertensive heart disease (06/09/2015), Lower extremity edema (06/09/2015), Male erectile dysfunction, unspecified (06/09/2015), Melanoma ([place]), Mohs defect (12/01/2019), Open wound of face (11/10/2019), Physical deconditioning (07/04/2017), Pneumonia, Pre-diabetes, Prediabetes, Recurrent major depressive disorder, in full remission ([place]) (06/09/2015),  Reduced libido (06/09/2015), Restless leg syndrome, Simple chronic bronchitis ([place]) (07/04/2017), Stroke ([place]) (2012), Vascular dementia ([place]) (06/09/2015), Vitamin B 12 deficiency (06/09/2015), Vitamin D deficiency (01/07/2018), and Wears glasses.   Surgical History:   Past Surgical History:  Procedure Laterality Date   ADJACENT TISSUE TRANSFER/TISSUE REARRANGEMENT N/A 12/01/2019   Procedure: Reconstruction of nasal defect with paramedian forehead flap and ear cartilage graft;  Surgeon: Cindra Presume, MD;  Location: [facility];  Service: Plastics;  Laterality: N/A;   CATARACT EXTRACTION     COLONOSCOPY     DRUG INDUCED ENDOSCOPY     HEMORROIDECTOMY     LESION EXCISION WITH COMPLEX REPAIR Left 08/19/2019   Procedure: LEFT FACIAL MELANOMA EXCISION WITH COMPLEX REPAIR;  Surgeon: Izora Gala, MD;  Location: [facility];  Service: ENT;  Laterality: Left;   LYMPH NODE BIOPSY Left 08/19/2019   Procedure: LEFT LEVEL THREE SENTINEL LYMPH NODE DISSECTION/BIOPSY;  Surgeon: Izora Gala, MD;  Location: [facility];  Service: ENT;  Laterality: Left;   MELANOMA EXCISION     NASAL SEPTUM SURGERY     PAROTIDECTOMY Left 08/19/2019   LEFT SUPERFICIAL PAROTIDECTOMY, with facial nerve dissection   PAROTIDECTOMY Left 08/19/2019   Procedure: LEFT PAROTIDECTOMY;  Surgeon: Izora Gala, MD;  Location: [facility];  Service:  ENT;  Laterality: Left;   PROSTATE SURGERY     SENTINEL LYMPH NODE BIOPSY  08/19/2019   LEFT LEVEL THREE SENTINEL LYMPH NODE DISSECTION/BIOPSY   SKIN FULL THICKNESS GRAFT Left 08/19/2019   Procedure: SKIN GRAFT FULL THICKNESS;  Surgeon: Izora Gala, MD;  Location: [facility];  Service: ENT;  Laterality: Left;   VASECTOMY       Social History:   reports that he quit smoking about 40 years ago. His smoking use included cigarettes. He has a 15.00 pack-year smoking history. He has quit using smokeless tobacco.  His smokeless tobacco use included chew. He reports that he does not currently use alcohol. He reports that he does not  use drugs.   Family History:  His family history includes Asthma in his brother; CVA in his mother; Heart attack in his brother, father, and mother; Hyperlipidemia in his brother; Hypertension in his brother, father, and mother; Stroke in his father and mother.   Allergies Allergies  Allergen Reactions   Shellfish Allergy Hives and Shortness Of Breath   Codeine Nausea Only   Lisinopril Other (See Comments) and Cough    Memory difficulty also     Home Medications  Prior to Admission medications   Medication Sig Start Date End Date Taking? Authorizing Provider  albuterol (PROVENTIL) (2.5 MG/3ML) 0.083% nebulizer solution Take 2.5 mg by nebulization every 6 (six) hours as needed for wheezing or shortness of breath.   Yes [provider]  bisacodyl (DULCOLAX) 10 MG suppository Place 10 mg rectally as needed for mild constipation or moderate constipation.   Yes [provider]  Cholecalciferol (VITAMIN D3) 1000 units CAPS Take 1,000 Units by mouth in the morning and at bedtime.   Yes [provider]  CLEAR EYES FOR DRY EYES 1-0.25 % SOLN Place 1 drop into both eyes 4 (four) times daily as needed (for dryness).   Yes [provider]  clopidogrel (PLAVIX) 75 MG tablet Take 37.5 mg by mouth every other day.   Yes [provider]  COLACE 100 MG capsule Take 200 mg by mouth in the morning and at bedtime.   Yes [provider]  escitalopram (LEXAPRO) 20 MG tablet Take 20 mg by mouth at bedtime.   Yes [provider]  gabapentin (NEURONTIN) 100 MG capsule Take 100 mg by mouth at bedtime.   Yes [provider]  ibuprofen (ADVIL) 200 MG tablet Take 400 mg by mouth in the morning.   Yes [provider]  loperamide (IMODIUM A-D) 2 MG tablet Take 2 mg by mouth 4 (four) times daily as needed for diarrhea or loose stools.   Yes [provider]  LORazepam (ATIVAN) 0.5 MG tablet Take 0.5 mg by mouth See admin  instructions. Take 0.5 mg by mouth at bedtime and an additional 0.5 mg every 4 hours as needed for agitation or anxiousness   Yes [provider]  metoprolol succinate (TOPROL-XL) 25 MG 24 hr tablet Take 12.5 mg by mouth in the morning.   Yes [provider]  ondansetron (ZOFRAN) 4 MG tablet Take 4 mg by mouth in the morning and at bedtime. 04/03/22  Yes [provider]  PEPTO-BISMOL 262 MG/15ML suspension Take 30 mLs by mouth 2 (two) times daily as needed for indigestion or diarrhea or loose stools.   Yes [provider]  polyethylene glycol powder (GLYCOLAX/MIRALAX) 17 GM/SCOOP powder Take 17 g by mouth in the morning.   Yes [provider]  PREPARATION H  0.25-14-74.9 % rectal ointment Place rectally 3 (three) times daily as needed for hemorrhoids. 02/28/22  Yes [provider]  QUEtiapine (SEROQUEL) 50 MG tablet Take 50 mg by mouth in the morning and at bedtime.   Yes [provider]  SARNA SENSITIVE 1 % LOTN Apply 1 Application topically See admin instructions. Apply to the back after each shower   Yes [provider]  sennosides-docusate sodium (SENOKOT-S) 8.6-50 MG tablet Take 1-2 tablets by mouth See admin instructions. Take 2 tablets by mouth in the morning and 1 tablet at bedtime   Yes [provider]  spironolactone (ALDACTONE) 25 MG tablet Take 25 mg by mouth every evening.   Yes [provider]  terazosin (HYTRIN) 5 MG capsule Take 5 mg by mouth at bedtime.   Yes [provider]  traZODone (DESYREL) 50 MG tablet Take 25 mg by mouth at bedtime.   Yes [provider]  TRELEGY ELLIPTA 100-62.5-25 MCG/INH AEPB Inhale 1 puff into the lungs See admin instructions. Inhale 1 puff into the lungs every 4 days 06/05/20  Yes [provider]  valsartan (DIOVAN) 80 MG tablet Take 40 mg by mouth in the morning.   Yes [provider]  donepezil (ARICEPT) 10 MG tablet Take 10 mg by  mouth at bedtime. Patient not taking: Reported on 04/13/2022    [provider]  memantine (NAMENDA) 10 MG tablet Take 10 mg by mouth 2 (two) times daily.  Patient not taking: Reported on 04/13/2022 10/27/17   [provider]  QUEtiapine (SEROQUEL) 25 MG tablet Take 25 mg by mouth at bedtime. Patient not taking: Reported on 04/13/2022 10/22/19   [provider]     Critical care time: [place] MSN, AGACNP-BC [place] for pager 04/14/2022, 9:18 AM

## 2022-04-14 NOTE — Progress Notes (Signed)
PROGRESS NOTE  [name].  LFY:[id number] DOB: 1932/09/14 DOA: 04/13/2022 PCP: Renaldo Reel, PA   Brief Narrative: Kevin Lynch. is a 88 y.o. male with medical history significant of advanced dementia, currently under hospice services, CVA, vascular dementia, hypertension, COPD, depression, diastolic CHF who presented from home with complaint of abdominal pain. On presentation, he was hemodynamically stable.  Lab work showed creatinine of 1.4.  WBC count of 15.6.  CT abdomen/pelvis and [place] RUQ suggestive of  acute cholecystitis.General surgery consulted.  Due to his advanced dementia, he is not a good surgical candidate as per surgery.  IR consulted today with plan for right upper quadrant drain.  His blood pressure dropped, critical care consulted for the management of septic shock  Assessment & Plan:   Severe sepsis/septic shock: Became hypotensive this morning.  Elevated lactic acid level.  Has persistent leukocytosis, febrile this morning.  Patient being admitted to stepdown.  Critical care consulted for need of vasopressors.  Continue IV fluids.  Follow-up cultures  Acute cholecystitis: Imaging showed cholelithiasis with gallbladder inflammation consistent with acute cholecystitis.  General surgery following.  Due to his advanced age and dementia, surgery not recommended.  IR consulted for cholecystostomy drain.   Antibiotics started.  Follow-up cultures.  Currently NPO.  Continue IV fluids Has mild leukocytosis   CKD stage 3a: Baseline creatinine range of 1.6-1.7.Currently  kidney function near baseline    history of COPD: Currently not on exacerbation.  Continue home inhaler.  As needed bronchodilators.  Not on oxygen at home.  Currently on 2 L of oxygen for comfort   Hypertension: Takes metoprolol, ARB, spironolactone at home.  These are on hold   Diastolic CHF: Continue IV fluids for now as patient is n.p.o.  Currently looks euvolemic.  Last echo showed EF of 62%, grade 1  diastolic dysfunction   History of stroke/vascular dementia: Patient has dementia at baseline.  Remains confused.  Alert and oriented to self only.  Continue delirium precautions   BPH: On terazosin   History of depression: On Celexa   Goals of care: CODE STATUS DNR, confirmed with wife.  Patient is under hospice services at home. Elderly patient with dementia presented with acute cholecystitis.  Not a candidate surgery.  High risk for decompensation.  Palliative care consulted.Wife says that we can use vasopressors if it helps.         DVT prophylaxis:enoxaparin (LOVENOX) injection 30 mg Start: 04/14/22 2200     Code Status: DNR  Family Communication: Wife on phone on 12/15  Patient status:Inpatient  Patient is from :Home  Anticipated discharge EN:IDPO  Estimated DC date:not sure   Consultants: General surgery, IR  Procedures: None yet  Antimicrobials:  Anti-infectives (From admission, onward)    Start     Dose/Rate Route Frequency Ordered Stop   04/13/22 2000  cefTRIAXone (ROCEPHIN) 2 g in sodium chloride 0.9 % 100 mL IVPB        2 g 200 mL/hr over 30 Minutes Intravenous Every 24 hours 04/13/22 1541     04/13/22 2000  metroNIDAZOLE (FLAGYL) IVPB 500 mg        500 mg 100 mL/hr over 60 Minutes Intravenous Every 8 hours 04/13/22 1541     04/13/22 1215  piperacillin-tazobactam (ZOSYN) IVPB 3.375 g        3.375 g 100 mL/hr over 30 Minutes Intravenous  Once 04/13/22 1213 04/13/22 1506       Subjective: Patient seen and examined at bedside  today.  During my evaluation today, he was hypotensive, given fluid boluses.  He was mildly febrile.  Mental status the same as yesterday.  Oriented to self only.  Does not look in distress.  Abdomen looks distended today.  Objective: Vitals:   04/14/22 0915 04/14/22 1030 04/14/22 1045 04/14/22 1100  BP: 93/64 100/66 131/64 96/70  Pulse: 93 92 91 93  Resp: (!) '7 12 13 '$ (!) 9  Temp:      TempSrc:      SpO2: 95% 96% 94% 97%   Weight:      Height:        Intake/Output Summary (Last 24 hours) at 04/14/2022 1132 Last data filed at 04/14/2022 0846 Gross per 24 hour  Intake 2650 ml  Output --  Net 2650 ml   Filed Weights   04/13/22 1723  Weight: 77.1 kg    Examination:  General exam: Very deconditioned, chronically looking, weak HEENT: PERRL Respiratory system: Diminished sounds on bases Cardiovascular system: S1 & S2 heard, RRR.  Gastrointestinal system: Abdomen is distended, right upper quadrant tenderness, bowel sounds heard Central nervous system: Alert and oriented to self only Extremities: No edema, no clubbing ,no cyanosis Skin: No rashes, no ulcers,no icterus     Data Reviewed: I have personally reviewed following labs and imaging studies  CBC: Recent Labs  Lab 04/13/22 0936 04/14/22 0421  WBC 15.6* 15.9*  HGB 14.6 13.9  HCT 46.1 43.2  MCV 96.0 95.6  PLT 264 528   Basic Metabolic Panel: Recent Labs  Lab 04/13/22 0936 04/14/22 0421  NA 136 137  K 4.1 5.4*  CL 102 103  CO2 23 24  GLUCOSE 197* 129*  BUN 28* 32*  CREATININE 1.43* 1.80*  CALCIUM 9.0 8.5*     Recent Results (from the past 240 hour(s))  Culture, blood (Routine X 2) w Reflex to ID Panel     Status: None (Preliminary result)   Collection Time: 04/13/22  8:28 PM   Specimen: BLOOD  Result Value Ref Range Status   Specimen Description   Final    BLOOD BLOOD RIGHT HAND Performed at [facility] [address] [place], [place] [address]    Special Requests   Final    BOTTLES DRAWN AEROBIC AND ANAEROBIC Blood Culture results may not be optimal due to an excessive volume of blood received in culture bottles Performed at [facility] [address] [place], [place] [address]    Culture   Final    NO GROWTH < 12 HOURS Performed at [facility] [address], [place], [place] [address]    Report Status PENDING  Incomplete  Culture, blood (Routine X 2) w Reflex  to ID Panel     Status: None (Preliminary result)   Collection Time: 04/13/22  8:28 PM   Specimen: BLOOD  Result Value Ref Range Status   Specimen Description   Final    BLOOD BLOOD LEFT HAND Performed at [facility] [address], [place], [place] [address]    Special Requests   Final    BOTTLES DRAWN AEROBIC AND ANAEROBIC Blood Culture adequate volume Performed at [facility] [address], [place], [place] [address]    Culture   Final    NO GROWTH < 12 HOURS Performed at [facility] [address], [place], [place] [address]    Report Status PENDING  Incomplete     Radiology Studies: DG Abdomen  Acute W/Chest  Result Date: 04/14/2022 CLINICAL DATA:  88 year old male with history of abdominal distension. EXAM: DG ABDOMEN ACUTE WITH 1 VIEW CHEST COMPARISON:  Chest x-ray 08/27/2019. FINDINGS: Lung volumes are low with bibasilar areas of scarring and/or atelectasis. No consolidative airspace disease. No pleural effusions. No pneumothorax. No pulmonary nodule or mass noted. Pulmonary vasculature and the cardiomediastinal silhouette are within normal limits. Atherosclerotic calcifications are noted in the thoracic aorta. Multiple prominent gas-filled loops of small bowel fill the central abdomen, many of which are borderline dilated. Several air-fluid levels are noted in the small bowel. Large amount of gas and stool noted throughout the colon and distal rectum. No pneumoperitoneum. IMPRESSION: 1. Nonspecific bowel-gas pattern, as above. The possibility of early or partial small bowel obstruction should be considered, although not strongly favored on the basis of today's examination, particularly in light of the recent CT study. 2. No pneumoperitoneum. 3. Low lung volumes without radiographic evidence of acute cardiopulmonary disease. 4. Aortic atherosclerosis. Electronically Signed   By: Vinnie Langton M.D.   On: 04/14/2022 06:28   US  Abdomen Limited RUQ (LIVER/GB)  Result Date: 04/13/2022 CLINICAL DATA:  Abdominal pain. EXAM: ULTRASOUND ABDOMEN LIMITED RIGHT UPPER QUADRANT COMPARISON:  CT abdomen and pelvis 04/13/2022 FINDINGS: Gallbladder: Thickened gallbladder wall measuring 5 mm. 1 cm gallstone. A positive sonographic Percell Miller sign was reported by the sonographer. Common bile duct: Diameter: 3 mm Liver: Suboptimally assessed due to bowel gas with the known left hepatic lobe cysts on CT not clearly shown by ultrasound. Portal vein is patent on color Doppler imaging with normal direction of blood flow towards the liver. Other: None. IMPRESSION: Cholelithiasis with gallbladder wall thickening and positive sonographic Murphy sign, suspicious for acute cholecystitis. Electronically Signed   By: Logan Bores M.D.   On: 04/13/2022 13:28   CT ABDOMEN PELVIS W CONTRAST  Result Date: 04/13/2022 CLINICAL DATA:  Abdominal pain, acute nonlocalized. Symptoms for 12 hours. EXAM: CT ABDOMEN AND PELVIS WITH CONTRAST TECHNIQUE: Multidetector CT imaging of the abdomen and pelvis was performed using the standard protocol following bolus administration of intravenous contrast. RADIATION DOSE REDUCTION: This exam was performed according to the departmental dose-optimization program which includes automated exposure control, adjustment of the mA and/or kV according to patient size and/or use of iterative reconstruction technique. CONTRAST:  71m OMNIPAQUE IOHEXOL 300 MG/ML  SOLN COMPARISON:  Abdominopelvic CT 08/27/2019.  PET-CT 06/13/2019. FINDINGS: Lower chest: Interval mildly increased dependent opacities at both lung bases, likely reflecting atelectasis. No confluent airspace disease, residual suspicious nodularity or significant pleural effusion demonstrated. The heart is enlarged. There is atherosclerosis of the aorta and coronary arteries. Hepatobiliary: The liver is normal in density without suspicious focal abnormality. There are unchanged cysts  within the left hepatic lobe. The gallbladder is mildly distended with mild wall thickening and surrounding inflammation. No calcified gallstones. No intra or extrahepatic biliary dilatation. Pancreas: Unremarkable. No pancreatic ductal dilatation or surrounding inflammatory changes. Spleen: Normal in size without focal abnormality. Adrenals/Urinary Tract: Both adrenal glands appear normal. No evidence of urinary tract calculus, suspicious renal lesion or hydronephrosis. Unchanged simple right renal cysts for which no follow-up imaging is recommended. The bladder appears unremarkable for its degree of distention. Stomach/Bowel: No enteric contrast administered. The stomach appears unremarkable for its degree of distention. Chronic nodular thickening along the lateral duodenal bulb measuring 1.6 x 1.2 cm on image 31/2 is unchanged. No evidence of bowel distension, wall thickening or surrounding inflammation. The appendix appears normal. Mildly prominent stool throughout the  colon. There are diverticular changes in the descending and sigmoid colon. Vascular/Lymphatic: There are no enlarged abdominal or pelvic lymph nodes. Aortic and branch vessel atherosclerosis without evidence of aneurysm or large vessel occlusion. The portal, superior mesenteric and splenic veins are patent. Reproductive: Stable mild prostatomegaly. Other: No evidence of abdominal wall mass or hernia. No ascites. Musculoskeletal: No acute or significant osseous findings. Chronic bilateral L5 pars defects with associated grade 1 anterolisthesis and moderate foraminal narrowing bilaterally at L5-S1. IMPRESSION: 1. Distended gallbladder with mild wall thickening and surrounding inflammation suspicious for acute cholecystitis. No calcified gallstones or biliary dilatation. 2. No other acute findings or explanation for the patient's symptoms. 3. Distal colonic diverticulosis without evidence of acute inflammation. 4. Stable chronic nodular thickening  along the lateral duodenal bulb. 5. Chronic bilateral L5 pars defects with associated grade 1 anterolisthesis and moderate foraminal narrowing bilaterally at L5-S1. 6. Previously demonstrated pulmonary nodularity is no longer clearly seen although may be obscured by increased atelectasis at both lung bases. 7.  Aortic Atherosclerosis (ICD10-I70.0). Electronically Signed   By: Richardean Sale M.D.   On: 04/13/2022 11:31    Scheduled Meds:  enoxaparin (LOVENOX) injection  30 mg Subcutaneous Q24H   escitalopram  20 mg Oral Daily   fluticasone furoate-vilanterol  1 puff Inhalation Daily   And   umeclidinium bromide  1 puff Inhalation Daily   LORazepam  0.5 mg Oral QHS   polyethylene glycol  17 g Oral q AM   QUEtiapine  50 mg Oral BID   senna-docusate  1 tablet Oral QHS   senna-docusate  2 tablet Oral Daily   terazosin  5 mg Oral QHS   traZODone  25 mg Oral QHS   Continuous Infusions:  sodium chloride 125 mL/hr at 04/14/22 0929   cefTRIAXone (ROCEPHIN)  IV Stopped (04/13/22 2056)   metronidazole Stopped (04/14/22 7017)     LOS: 1 day   Shelly Coss, MD [facility] P12/15/2023, 11:32 AM

## 2022-04-14 NOTE — ED Notes (Signed)
Patient is finally resting quietly after pain medication

## 2022-04-14 NOTE — Consult Note (Signed)
Consultation Note Date: 04/14/2022   Patient Name: Kevin Lynch.  DOB: [birth year]  MRN: [id number]  Age / Sex: 88 y.o., male  PCP: Renaldo Reel, PA Referring Physician: Shelly Coss, MD  Reason for Consultation: Establishing goals of care  HPI/Patient Profile: 88 y.o. male  admitted on 04/13/2022    Clinical Assessment and Goals of Care: 88 year old gentleman who lives at home with his wife, has been connected with [facility] [place] since end of May 2023 with admission diagnosis of cerebrovascular disease/vascular dementia.  Patient admitted to the hospital with abdominal pain right upper quadrant showing cholelithiasis gallbladder wall thickening and positive sonographic Murphy sign.  Patient admitted to hospital medicine service for sepsis and cholecystitis, seen by interventional radiology and underwent percutaneous cholecystostomy drain placement on 04-14-2022.  Past medical history also includes COPD CVA, diastolic heart failure and stage III chronic kidney disease.  Patient has advance care planning documents, healthcare power of attorney document and noted on the chart designating his wife and his son as HCPOA agents.  PMT consulted for ongoing goals of care discussions and for additional support. Patient just returned after having percutaneous drain placed resting in bed. Call placed and discussed with patient's wife and at [place].  Discussed about scope of current hospitalization.  Goals wishes and values attempted to be explored.  Patient's wife is appreciative of hospice services following with the patient at home and wishes to continue hospice care upon discharge.  See below. Palliative medicine is specialized medical care for people living with serious illness. It focuses on providing relief from the symptoms and stress of a serious illness. The goal is to improve quality of life for both the  patient and the family. Goals of care: Broad aims of medical therapy in relation to the patient's values and preferences. Our aim is to provide medical care aimed at enabling patients to achieve the goals that matter most to them, given the circumstances of their particular medical situation and their constraints.    HCPOA  Wife Kevin Lynch.   SUMMARY OF RECOMMENDATIONS   DNR Monitor hospital course S/p percutaneous drain, is on antibiotics.  Patient has [facility] since end of May 2023, his hospice diagnosis is CVD/dementia.  Recommend continuation of hospice services once patient is considered stable enough for discharge.  Monitor for pain and non pain symptoms Thank you for the consult.   Code Status/Advance Care Planning: DNR   Symptom Management:    Palliative Prophylaxis:  Delirium Protocol  Psycho-social/Spiritual:  Desire for further Chaplaincy support:yes Additional Recommendations: Caregiving  Support/Resources  Prognosis:  < 6 months  Discharge Planning: Home with Hospice      Primary Diagnoses: Present on Admission:  Acute cholecystitis  Diastolic CHF ([place])  COPD (chronic obstructive pulmonary disease) ([place])  Vascular dementia ([place])  Hypertension  Severe sepsis ([place])   I have reviewed the medical record, interviewed the patient and family, and examined the patient. The following aspects are pertinent.  Past Medical  History:  Diagnosis Date   Actinic keratosis    Anemia    Arthritis    Ataxic gait 06/09/2015   Basal cell carcinoma    skin - chest, back and face   BPH (benign prostatic hyperplasia) 06/09/2015   Cataracts, bilateral    removed by surgery   Cerebral vascular disease 06/09/2015   Chronic diarrhea 01/07/2018   Chronic gastric ulcer 06/10/2015   CKD (chronic kidney disease) 02/01/2018   Claudication, intermittent ([place]) 06/09/2015   COPD (chronic obstructive pulmonary disease) ([place])    COPD (chronic obstructive  pulmonary disease) ([place]) 06/09/2015   Dementia ([place])    Depression    Diastolic CHF ([place]) 11/01/4191   Procedure narrative: Transthoracic echocardiography. Image   quality was adequate. Intravenous contrast (Definity) was   administered. - Left ventricle: The cavity size was normal. There was moderate   focal basal and mild concentric hypertrophy. Systolic function   was normal. The estimated ejection fraction was in the range of   60% to 65%. Wall motion was normal; there were no regional wall   m   Diastolic heart failure (HCC)    Diverticulitis    Diverticulitis    Enlarged thoracic aorta ([place]) 01/31/2018   Former smoker    quit 1983-smoked 30 yrs   Gastric ulcer    from 06/06/15: ULCER/GASTRITIS/ANEMIA:   Gastroesophageal reflux disease with esophagitis 06/10/2015   HDL lipoprotein deficiency    History of blood transfusion    History of stroke    Hyperglycemia    Hyperlipidemia    no meds   Hypertension    Hypertensive heart disease 06/09/2015   Lower extremity edema 06/09/2015   Male erectile dysfunction, unspecified 06/09/2015   Melanoma ([place])    left side of face   Mohs defect 12/01/2019   Open wound of face 11/10/2019   Physical deconditioning 07/04/2017   Pneumonia    x 1   Pre-diabetes    does not check blood sugar   Prediabetes    Recurrent major depressive disorder, in full remission ([place]) 06/09/2015   Reduced libido 06/09/2015   Restless leg syndrome    Simple chronic bronchitis ([place]) 07/04/2017   IgE >> 55 on 06/26/2017    Stroke [facility]) 2012   Vascular dementia ([place]) 06/09/2015   Vitamin B 12 deficiency 06/09/2015   Vitamin D deficiency 01/07/2018   Wears glasses    Social History   Socioeconomic History   Marital status: Married    Spouse name: Not on file   Number of children: Not on file   Years of education: Not on file   Highest education level: Not on file  Occupational History   Not on file  Tobacco Use   Smoking status: Former    Packs/day: 0.50    Years: 30.00     Total pack years: 15.00    Types: Cigarettes    Quit date: 05/01/1981    Years since quitting: 40.9   Smokeless tobacco: Former    Types: Nurse, children's Use: Never used  Substance and Sexual Activity   Alcohol use: Not Currently   Drug use: Never   Sexual activity: Not on file  Other Topics Concern   Not on file  Social History Narrative   Not on file   Social Determinants of Health   Financial Resource Strain: Not on file  Food Insecurity: No Food Insecurity (04/14/2022)   Hunger Vital Sign    Worried About Running Out of  Food in the Last Year: Never true    [place] in the Last Year: Never true  Transportation Needs: No Transportation Needs (04/14/2022)   PRAPARE - Hydrologist (Medical): No    Lack of Transportation (Non-Medical): No  Physical Activity: Not on file  Stress: Not on file  Social Connections: Not on file   Family History  Problem Relation Age of Onset   Stroke Mother    Hypertension Mother    CVA Mother    Heart attack Mother    Hypertension Father    Stroke Father    Heart attack Father    Asthma Brother    Hypertension Brother    Heart attack Brother    Hyperlipidemia Brother    Scheduled Meds:  Chlorhexidine Gluconate Cloth  6 each Topical Daily   enoxaparin (LOVENOX) injection  30 mg Subcutaneous Q24H   escitalopram  20 mg Oral Daily   fluticasone furoate-vilanterol  1 puff Inhalation Daily   And   umeclidinium bromide  1 puff Inhalation Daily   polyethylene glycol  17 g Oral q AM   QUEtiapine  50 mg Oral BID   senna-docusate  1 tablet Oral QHS   senna-docusate  2 tablet Oral Daily   sodium chloride flush  5 mL Intracatheter Q8H   terazosin  5 mg Oral QHS   traZODone  25 mg Oral QHS   Continuous Infusions:  sodium chloride 125 mL/hr at 04/14/22 1326   cefTRIAXone (ROCEPHIN)  IV Stopped (04/13/22 2056)   metronidazole Stopped (04/14/22 1307)   PRN Meds:.HYDROmorphone (DILAUDID) injection,  ipratropium-albuterol, LORazepam, mouth rinse, oxyCODONE Medications Prior to Admission:  Prior to Admission medications   Medication Sig Start Date End Date Taking? Authorizing Provider  albuterol (PROVENTIL) (2.5 MG/3ML) 0.083% nebulizer solution Take 2.5 mg by nebulization every 6 (six) hours as needed for wheezing or shortness of breath.   Yes [provider]  bisacodyl (DULCOLAX) 10 MG suppository Place 10 mg rectally as needed for mild constipation or moderate constipation.   Yes [provider]  Cholecalciferol (VITAMIN D3) 1000 units CAPS Take 1,000 Units by mouth in the morning and at bedtime.   Yes [provider]  CLEAR EYES FOR DRY EYES 1-0.25 % SOLN Place 1 drop into both eyes 4 (four) times daily as needed (for dryness).   Yes [provider]  clopidogrel (PLAVIX) 75 MG tablet Take 37.5 mg by mouth every other day.   Yes [provider]  COLACE 100 MG capsule Take 200 mg by mouth in the morning and at bedtime.   Yes [provider]  escitalopram (LEXAPRO) 20 MG tablet Take 20 mg by mouth at bedtime.   Yes [provider]  gabapentin (NEURONTIN) 100 MG capsule Take 100 mg by mouth at bedtime.   Yes [provider]  ibuprofen (ADVIL) 200 MG tablet Take 400 mg by mouth in the morning.   Yes [provider]  loperamide (IMODIUM A-D) 2 MG tablet Take 2 mg by mouth 4 (four) times daily as needed for diarrhea or loose stools.   Yes [provider]  LORazepam (ATIVAN) 0.5 MG tablet Take 0.5 mg by mouth See admin instructions. Take 0.5 mg by mouth at bedtime and an additional 0.5 mg every 4 hours as needed for agitation or anxiousness   Yes [provider]  metoprolol succinate (TOPROL-XL) 25 MG 24 hr tablet Take 12.5 mg by mouth in the morning.  Yes [provider]  ondansetron (ZOFRAN) 4 MG tablet Take 4 mg by mouth in the morning and at bedtime. 04/03/22  Yes [provider]   PEPTO-BISMOL 262 MG/15ML suspension Take 30 mLs by mouth 2 (two) times daily as needed for indigestion or diarrhea or loose stools.   Yes [provider]  polyethylene glycol powder (GLYCOLAX/MIRALAX) 17 GM/SCOOP powder Take 17 g by mouth in the morning.   Yes [provider]  PREPARATION H 0.25-14-74.9 % rectal ointment Place rectally 3 (three) times daily as needed for hemorrhoids. 02/28/22  Yes [provider]  QUEtiapine (SEROQUEL) 50 MG tablet Take 50 mg by mouth in the morning and at bedtime.   Yes [provider]  SARNA SENSITIVE 1 % LOTN Apply 1 Application topically See admin instructions. Apply to the back after each shower   Yes [provider]  sennosides-docusate sodium (SENOKOT-S) 8.6-50 MG tablet Take 1-2 tablets by mouth See admin instructions. Take 2 tablets by mouth in the morning and 1 tablet at bedtime   Yes [provider]  spironolactone (ALDACTONE) 25 MG tablet Take 25 mg by mouth every evening.   Yes [provider]  terazosin (HYTRIN) 5 MG capsule Take 5 mg by mouth at bedtime.   Yes [provider]  traZODone (DESYREL) 50 MG tablet Take 25 mg by mouth at bedtime.   Yes [provider]  TRELEGY ELLIPTA 100-62.5-25 MCG/INH AEPB Inhale 1 puff into the lungs See admin instructions. Inhale 1 puff into the lungs every 4 days 06/05/20  Yes [provider]  valsartan (DIOVAN) 80 MG tablet Take 40 mg by mouth in the morning.   Yes [provider]  donepezil (ARICEPT) 10 MG tablet Take 10 mg by mouth at bedtime. Patient not taking: Reported on 04/13/2022    [provider]  memantine (NAMENDA) 10 MG tablet Take 10 mg by mouth 2 (two) times daily.  Patient not taking: Reported on 04/13/2022 10/27/17   [provider]  QUEtiapine (SEROQUEL) 25 MG tablet Take 25 mg by mouth at bedtime. Patient not taking: Reported on 04/13/2022 10/22/19   [provider]    Allergies  Allergen Reactions   Shellfish Allergy Hives and Shortness Of Breath   Codeine Nausea Only   Lisinopril Other (See Comments) and Cough    Memory difficulty also   Review of Systems Does not appear uncomfortable.   Physical Exam Resting in bed No distress Monitor noted Has perc drain placed.   Vital Signs: BP (!) 124/45   Pulse 78   Temp 98.5 F (36.9 C) (Axillary)   Resp 11   Ht '5\' 3"'$  (1.6 m)   Wt 73.9 kg   SpO2 98%   BMI 28.86 kg/m  Pain Scale: PAINAD   Pain Score: 0-No pain   SpO2: SpO2: 98 % O2 Device:SpO2: 98 % O2 Flow Rate: .O2 Flow Rate (L/min): 2 L/min  IO: Intake/output summary:  Intake/Output Summary (Last 24 hours) at 04/14/2022 1440 Last data filed at 04/14/2022 1344 Gross per 24 hour  Intake 3413.04 ml  Output --  Net 3413.04 ml    LBM: Last BM Date : 04/12/22 Baseline Weight: Weight: 77.1 kg Most recent weight: Weight: 73.9 kg     Palliative Assessment/Data:   PPS 50%  Time In: 1400 Time Out:  1500 Time Total:  60     Greater than 50%  of this time was spent counseling and coordinating care related to the above assessment  and plan.  Signed by: Loistine Chance, MD   Please contact Palliative Medicine Team phone at [phone] for questions and concerns.  For individual provider: See Shea Evans

## 2022-04-14 NOTE — ED Notes (Signed)
Patient continues to yell in pain,  ERMD notified and ordered additional dose of dilaudid.

## 2022-04-14 NOTE — Progress Notes (Signed)
Progress Note     Subjective: Alert but not conversant. Uncomfortable appearing   Objective: Vital signs in last 24 hours: Temp:  [97.4 F (36.3 C)-100.1 F (37.8 C)] 100.1 F (37.8 C) (12/15 0740) Pulse Rate:  [95-129] 105 (12/15 0736) Resp:  [11-16] 14 (12/15 0736) BP: (76-150)/(49-138) 98/67 (12/15 0736) SpO2:  [90 %-98 %] 95 % (12/15 0730) Weight:  [77.1 kg] 77.1 kg (12/14 1723) Last BM Date : 04/12/22  Intake/Output from previous day: 12/14 0701 - 12/15 0700 In: 650 [IV Piggyback:650] Out: -  Intake/Output this shift: Total I/O In: 1000 [I.V.:500; IV Piggyback:500] Out: -   PE: General: pleasant, WD, male who is laying in bed in NAD Heart: tachycardic, regular rhythm  Lungs: Respiratory effort nonlabored on supplemental O2 Abd: distended. Focal TTP in RUQ MSK: all 4 extremities are symmetrical with no cyanosis, clubbing, or edema. Skin: warm and dry Neuro: Cranial nerves 2-12 grossly intact, sensation is normal throughout Psych: A&Ox3 with an appropriate affect.    Lab Results:  Recent Labs    04/13/22 0936 04/14/22 0421  WBC 15.6* 15.9*  HGB 14.6 13.9  HCT 46.1 43.2  PLT 264 290   BMET Recent Labs    04/13/22 0936 04/14/22 0421  NA 136 137  K 4.1 5.4*  CL 102 103  CO2 23 24  GLUCOSE 197* 129*  BUN 28* 32*  CREATININE 1.43* 1.80*  CALCIUM 9.0 8.5*   PT/INR No results for input(s): "LABPROT", "INR" in the last 72 hours. CMP     Component Value Date/Time   NA 137 04/14/2022 0421   K 5.4 (H) 04/14/2022 0421   CL 103 04/14/2022 0421   CO2 24 04/14/2022 0421   GLUCOSE 129 (H) 04/14/2022 0421   BUN 32 (H) 04/14/2022 0421   CREATININE 1.80 (H) 04/14/2022 0421   CALCIUM 8.5 (L) 04/14/2022 0421   PROT 6.0 (L) 04/14/2022 0421   ALBUMIN 2.9 (L) 04/14/2022 0421   AST 18 04/14/2022 0421   ALT 23 04/14/2022 0421   ALKPHOS 55 04/14/2022 0421   BILITOT 0.4 04/14/2022 0421   GFRNONAA 36 (L) 04/14/2022 0421   GFRAA 45 (L) 12/01/2019 0558    Lipase     Component Value Date/Time   LIPASE 25 04/13/2022 0936       Studies/Results: DG Abdomen Acute W/Chest  Result Date: 04/14/2022 CLINICAL DATA:  88 year old male with history of abdominal distension. EXAM: DG ABDOMEN ACUTE WITH 1 VIEW CHEST COMPARISON:  Chest x-ray 08/27/2019. FINDINGS: Lung volumes are low with bibasilar areas of scarring and/or atelectasis. No consolidative airspace disease. No pleural effusions. No pneumothorax. No pulmonary nodule or mass noted. Pulmonary vasculature and the cardiomediastinal silhouette are within normal limits. Atherosclerotic calcifications are noted in the thoracic aorta. Multiple prominent gas-filled loops of small bowel fill the central abdomen, many of which are borderline dilated. Several air-fluid levels are noted in the small bowel. Large amount of gas and stool noted throughout the colon and distal rectum. No pneumoperitoneum. IMPRESSION: 1. Nonspecific bowel-gas pattern, as above. The possibility of early or partial small bowel obstruction should be considered, although not strongly favored on the basis of today's examination, particularly in light of the recent CT study. 2. No pneumoperitoneum. 3. Low lung volumes without radiographic evidence of acute cardiopulmonary disease. 4. Aortic atherosclerosis. Electronically Signed   By: Vinnie Langton M.D.   On: 04/14/2022 06:28   US Abdomen Limited RUQ (LIVER/GB)  Result Date: 04/13/2022 CLINICAL DATA:  Abdominal pain. EXAM: ULTRASOUND  ABDOMEN LIMITED RIGHT UPPER QUADRANT COMPARISON:  CT abdomen and pelvis 04/13/2022 FINDINGS: Gallbladder: Thickened gallbladder wall measuring 5 mm. 1 cm gallstone. A positive sonographic Percell Miller sign was reported by the sonographer. Common bile duct: Diameter: 3 mm Liver: Suboptimally assessed due to bowel gas with the known left hepatic lobe cysts on CT not clearly shown by ultrasound. Portal vein is patent on color Doppler imaging with normal direction of  blood flow towards the liver. Other: None. IMPRESSION: Cholelithiasis with gallbladder wall thickening and positive sonographic Murphy sign, suspicious for acute cholecystitis. Electronically Signed   By: Logan Bores M.D.   On: 04/13/2022 13:28   CT ABDOMEN PELVIS W CONTRAST  Result Date: 04/13/2022 CLINICAL DATA:  Abdominal pain, acute nonlocalized. Symptoms for 12 hours. EXAM: CT ABDOMEN AND PELVIS WITH CONTRAST TECHNIQUE: Multidetector CT imaging of the abdomen and pelvis was performed using the standard protocol following bolus administration of intravenous contrast. RADIATION DOSE REDUCTION: This exam was performed according to the departmental dose-optimization program which includes automated exposure control, adjustment of the mA and/or kV according to patient size and/or use of iterative reconstruction technique. CONTRAST:  29m OMNIPAQUE IOHEXOL 300 MG/ML  SOLN COMPARISON:  Abdominopelvic CT 08/27/2019.  PET-CT 06/13/2019. FINDINGS: Lower chest: Interval mildly increased dependent opacities at both lung bases, likely reflecting atelectasis. No confluent airspace disease, residual suspicious nodularity or significant pleural effusion demonstrated. The heart is enlarged. There is atherosclerosis of the aorta and coronary arteries. Hepatobiliary: The liver is normal in density without suspicious focal abnormality. There are unchanged cysts within the left hepatic lobe. The gallbladder is mildly distended with mild wall thickening and surrounding inflammation. No calcified gallstones. No intra or extrahepatic biliary dilatation. Pancreas: Unremarkable. No pancreatic ductal dilatation or surrounding inflammatory changes. Spleen: Normal in size without focal abnormality. Adrenals/Urinary Tract: Both adrenal glands appear normal. No evidence of urinary tract calculus, suspicious renal lesion or hydronephrosis. Unchanged simple right renal cysts for which no follow-up imaging is recommended. The bladder  appears unremarkable for its degree of distention. Stomach/Bowel: No enteric contrast administered. The stomach appears unremarkable for its degree of distention. Chronic nodular thickening along the lateral duodenal bulb measuring 1.6 x 1.2 cm on image 31/2 is unchanged. No evidence of bowel distension, wall thickening or surrounding inflammation. The appendix appears normal. Mildly prominent stool throughout the colon. There are diverticular changes in the descending and sigmoid colon. Vascular/Lymphatic: There are no enlarged abdominal or pelvic lymph nodes. Aortic and branch vessel atherosclerosis without evidence of aneurysm or large vessel occlusion. The portal, superior mesenteric and splenic veins are patent. Reproductive: Stable mild prostatomegaly. Other: No evidence of abdominal wall mass or hernia. No ascites. Musculoskeletal: No acute or significant osseous findings. Chronic bilateral L5 pars defects with associated grade 1 anterolisthesis and moderate foraminal narrowing bilaterally at L5-S1. IMPRESSION: 1. Distended gallbladder with mild wall thickening and surrounding inflammation suspicious for acute cholecystitis. No calcified gallstones or biliary dilatation. 2. No other acute findings or explanation for the patient's symptoms. 3. Distal colonic diverticulosis without evidence of acute inflammation. 4. Stable chronic nodular thickening along the lateral duodenal bulb. 5. Chronic bilateral L5 pars defects with associated grade 1 anterolisthesis and moderate foraminal narrowing bilaterally at L5-S1. 6. Previously demonstrated pulmonary nodularity is no longer clearly seen although may be obscured by increased atelectasis at both lung bases. 7.  Aortic Atherosclerosis (ICD10-I70.0). Electronically Signed   By: WRichardean SaleM.D.   On: 04/13/2022 11:31    Anti-infectives: Anti-infectives (From admission, onward)  Start     Dose/Rate Route Frequency Ordered Stop   04/13/22 2000  cefTRIAXone  (ROCEPHIN) 2 g in sodium chloride 0.9 % 100 mL IVPB        2 g 200 mL/hr over 30 Minutes Intravenous Every 24 hours 04/13/22 1541     04/13/22 2000  metroNIDAZOLE (FLAGYL) IVPB 500 mg        500 mg 100 mL/hr over 60 Minutes Intravenous Every 8 hours 04/13/22 1541     04/13/22 1215  piperacillin-tazobactam (ZOSYN) IVPB 3.375 g        3.375 g 100 mL/hr over 30 Minutes Intravenous  Once 04/13/22 1213 04/13/22 1506        Assessment/Plan  Acute cholecystitis  - RUQ [place] with cholelithiasis, gallbladder wall thickening and positive sonographic murphy sign  - WBC remains elevated to 15.9 on abx. LFTs normal. Worsening AKI with sCr 1.8 - hypotensive and tachycardic this am. Recommend a percutaneous cholecystostomy for source control. I discussed this with his wife who is in agreement and have reached out to IR  FEN: NPO ID: [email or web address] in ED. Rocephin/flagyl VTE: lovenox, hold plavix  - below per TRH -  Hx of CVA on plavix - LD am 12/14 Hx of melanoma  COPD Chronic diastolic CHF HTN HLD CKD Peripheral vascular disease GERD Vascular dementia  Hx of gastric ulcer  BPH Anemia of chronic disease   I reviewed hospitalist notes, last 24 h vitals and pain scores, last 48 h intake and output, last 24 h labs and trends, and last 24 h imaging results.     LOS: 1 day   [facility] 04/14/2022, 8:18 AM Please see [facility] for pager number during day hours 7:00am-4:30pm

## 2022-04-14 NOTE — Procedures (Signed)
Interventional Radiology Procedure Note  Procedure: Image guided drain placement, perc chole.  10F pigtail drain.  Complications: None  EBL: None Sample: Culture sent  Recommendations: - Routine drain care, with sterile flushes, record output - follow up Cx - routine wound care  Signed,  Francies Inch S. Delmi Fulfer, DO    

## 2022-04-14 NOTE — Consult Note (Signed)
Chief Complaint: Patient was seen in consultation today for percutaneous cholecystostomy Chief Complaint  Patient presents with   Abdominal Pain    Referring Physician(s): Jeanmarie Hubert  Supervising Physician: Corrie Mckusick  Patient Status: [facility] - ED  History of Present Illness: Kevin Lynch. is an 88 y.o. male with past medical history of anemia, prior skin cancer, cerebrovascular disease, PVD, chronic gastric ulcer, chronic kidney disease, COPD, dementia, depression, CHF, diverticulosis,  gastroesophageal reflux, prior stroke on Plavix ,hypertension who is under hospice services and presents now with abd pain/distension/diarrhea and findings suggesting acute cholecystitis. Temp 100.1, BP soft, tachycardic, WBC 15.9, hgb nl, plts nl, creat 1.80, k 5.4; PT/INR pend;  blood cx pend; pt on IV flagyl and rocephin;  patient was seen by surgery and due to his advanced dementia is not a good surgical candidate.  Request now received for percutaneous cholecystostomy.   Past Medical History:  Diagnosis Date   Actinic keratosis    Anemia    Arthritis    Ataxic gait 06/09/2015   Basal cell carcinoma    skin - chest, back and face   BPH (benign prostatic hyperplasia) 06/09/2015   Cataracts, bilateral    removed by surgery   Cerebral vascular disease 06/09/2015   Chronic diarrhea 01/07/2018   Chronic gastric ulcer 06/10/2015   CKD (chronic kidney disease) 02/01/2018   Claudication, intermittent ([place]) 06/09/2015   COPD (chronic obstructive pulmonary disease) ([place])    COPD (chronic obstructive pulmonary disease) ([place]) 06/09/2015   Dementia ([place])    Depression    Diastolic CHF ([place]) 01/04/931   Procedure narrative: Transthoracic echocardiography. Image   quality was adequate. Intravenous contrast (Definity) was   administered. - Left ventricle: The cavity size was normal. There was moderate   focal basal and mild concentric hypertrophy. Systolic function   was normal. The estimated ejection fraction  was in the range of   60% to 65%. Wall motion was normal; there were no regional wall   m   Diastolic heart failure (HCC)    Diverticulitis    Diverticulitis    Enlarged thoracic aorta ([place]) 01/31/2018   Former smoker    quit 1983-smoked 30 yrs   Gastric ulcer    from 06/06/15: ULCER/GASTRITIS/ANEMIA:   Gastroesophageal reflux disease with esophagitis 06/10/2015   HDL lipoprotein deficiency    History of blood transfusion    History of stroke    Hyperglycemia    Hyperlipidemia    no meds   Hypertension    Hypertensive heart disease 06/09/2015   Lower extremity edema 06/09/2015   Male erectile dysfunction, unspecified 06/09/2015   Melanoma ([place])    left side of face   Mohs defect 12/01/2019   Open wound of face 11/10/2019   Physical deconditioning 07/04/2017   Pneumonia    x 1   Pre-diabetes    does not check blood sugar   Prediabetes    Recurrent major depressive disorder, in full remission ([place]) 06/09/2015   Reduced libido 06/09/2015   Restless leg syndrome    Simple chronic bronchitis ([place]) 07/04/2017   IgE >> 55 on 06/26/2017    Stroke ([place]) 2012   Vascular dementia ([place]) 06/09/2015   Vitamin B 12 deficiency 06/09/2015   Vitamin D deficiency 01/07/2018   Wears glasses     Past Surgical History:  Procedure Laterality Date   ADJACENT TISSUE TRANSFER/TISSUE REARRANGEMENT N/A 12/01/2019   Procedure: Reconstruction of nasal defect with paramedian forehead flap and ear cartilage graft;  Surgeon:  Cindra Presume, MD;  Location: [facility];  Service: Plastics;  Laterality: N/A;   CATARACT EXTRACTION     COLONOSCOPY     DRUG INDUCED ENDOSCOPY     HEMORROIDECTOMY     LESION EXCISION WITH COMPLEX REPAIR Left 08/19/2019   Procedure: LEFT FACIAL MELANOMA EXCISION WITH COMPLEX REPAIR;  Surgeon: Izora Gala, MD;  Location: [facility];  Service: ENT;  Laterality: Left;   LYMPH NODE BIOPSY Left 08/19/2019   Procedure: LEFT LEVEL THREE SENTINEL LYMPH NODE DISSECTION/BIOPSY;  Surgeon: Izora Gala, MD;  Location: [place];  Service: ENT;  Laterality: Left;   MELANOMA EXCISION     NASAL SEPTUM SURGERY     PAROTIDECTOMY Left 08/19/2019   LEFT SUPERFICIAL PAROTIDECTOMY, with facial nerve dissection   PAROTIDECTOMY Left 08/19/2019   Procedure: LEFT PAROTIDECTOMY;  Surgeon: Izora Gala, MD;  Location: [place];  Service: ENT;  Laterality: Left;   PROSTATE SURGERY     SENTINEL LYMPH NODE BIOPSY  08/19/2019   LEFT LEVEL THREE SENTINEL LYMPH NODE DISSECTION/BIOPSY   SKIN FULL THICKNESS GRAFT Left 08/19/2019   Procedure: SKIN GRAFT FULL THICKNESS;  Surgeon: Izora Gala, MD;  Location: [place];  Service: ENT;  Laterality: Left;   VASECTOMY      Allergies: Shellfish allergy, Codeine, and Lisinopril  Medications: Prior to Admission medications   Medication Sig Start Date End Date Taking? Authorizing Provider  albuterol (PROVENTIL) (2.5 MG/3ML) 0.083% nebulizer solution Take 2.5 mg by nebulization every 6 (six) hours as needed for wheezing or shortness of breath.   Yes [provider]  bisacodyl (DULCOLAX) 10 MG suppository Place 10 mg rectally as needed for mild constipation or moderate constipation.   Yes [provider]  Cholecalciferol (VITAMIN D3) 1000 units CAPS Take 1,000 Units by mouth in the morning and at bedtime.   Yes [provider]  CLEAR EYES FOR DRY EYES 1-0.25 % SOLN Place 1 drop into both eyes 4 (four) times daily as needed (for dryness).   Yes [provider]  clopidogrel (PLAVIX) 75 MG tablet Take 37.5 mg by mouth every other day.   Yes [provider]  COLACE 100 MG capsule Take 200 mg by mouth in the morning and at bedtime.   Yes [provider]  escitalopram (LEXAPRO) 20 MG tablet Take 20 mg by mouth at bedtime.   Yes [provider]  gabapentin (NEURONTIN) 100 MG capsule Take 100 mg by mouth at bedtime.   Yes [provider]  ibuprofen (ADVIL) 200 MG tablet Take 400 mg by mouth in the morning.   Yes [provider]   loperamide (IMODIUM A-D) 2 MG tablet Take 2 mg by mouth 4 (four) times daily as needed for diarrhea or loose stools.   Yes [provider]  LORazepam (ATIVAN) 0.5 MG tablet Take 0.5 mg by mouth See admin instructions. Take 0.5 mg by mouth at bedtime and an additional 0.5 mg every 4 hours as needed for agitation or anxiousness   Yes [provider]  metoprolol succinate (TOPROL-XL) 25 MG 24 hr tablet Take 12.5 mg by mouth in the morning.   Yes [provider]  ondansetron (ZOFRAN) 4 MG tablet Take 4 mg by mouth in the morning and at bedtime. 04/03/22  Yes [provider]  PEPTO-BISMOL 262 MG/15ML suspension Take 30 mLs by mouth 2 (two) times daily as needed for indigestion or diarrhea or loose stools.   Yes [provider]  polyethylene glycol powder (GLYCOLAX/MIRALAX) 17  GM/SCOOP powder Take 17 g by mouth in the morning.   Yes [provider]  PREPARATION H 0.25-14-74.9 % rectal ointment Place rectally 3 (three) times daily as needed for hemorrhoids. 02/28/22  Yes [provider]  QUEtiapine (SEROQUEL) 50 MG tablet Take 50 mg by mouth in the morning and at bedtime.   Yes [provider]  SARNA SENSITIVE 1 % LOTN Apply 1 Application topically See admin instructions. Apply to the back after each shower   Yes [provider]  sennosides-docusate sodium (SENOKOT-S) 8.6-50 MG tablet Take 1-2 tablets by mouth See admin instructions. Take 2 tablets by mouth in the morning and 1 tablet at bedtime   Yes [provider]  spironolactone (ALDACTONE) 25 MG tablet Take 25 mg by mouth every evening.   Yes [provider]  terazosin (HYTRIN) 5 MG capsule Take 5 mg by mouth at bedtime.   Yes [provider]  traZODone (DESYREL) 50 MG tablet Take 25 mg by mouth at bedtime.   Yes [provider]  TRELEGY ELLIPTA 100-62.5-25 MCG/INH AEPB Inhale 1 puff into the lungs See admin instructions. Inhale 1 puff  into the lungs every 4 days 06/05/20  Yes [provider]  valsartan (DIOVAN) 80 MG tablet Take 40 mg by mouth in the morning.   Yes [provider]  donepezil (ARICEPT) 10 MG tablet Take 10 mg by mouth at bedtime. Patient not taking: Reported on 04/13/2022    [provider]  memantine (NAMENDA) 10 MG tablet Take 10 mg by mouth 2 (two) times daily.  Patient not taking: Reported on 04/13/2022 10/27/17   [provider]  QUEtiapine (SEROQUEL) 25 MG tablet Take 25 mg by mouth at bedtime. Patient not taking: Reported on 04/13/2022 10/22/19   [provider]     Family History  Problem Relation Age of Onset   Stroke Mother    Hypertension Mother    CVA Mother    Heart attack Mother    Hypertension Father    Stroke Father    Heart attack Father    Asthma Brother    Hypertension Brother    Heart attack Brother    Hyperlipidemia Brother     Social History   Socioeconomic History   Marital status: Married    Spouse name: Not on file   Number of children: Not on file   Years of education: Not on file   Highest education level: Not on file  Occupational History   Not on file  Tobacco Use   Smoking status: Former    Packs/day: 0.50    Years: 30.00    Total pack years: 15.00    Types: Cigarettes    Quit date: 05/01/1981    Years since quitting: 40.9   Smokeless tobacco: Former    Types: Nurse, children's Use: Never used  Substance and Sexual Activity   Alcohol use: Not Currently   Drug use: Never   Sexual activity: Not on file  Other Topics Concern   Not on file  Social History Narrative   Not on file   Social Determinants of Health   Financial Resource Strain: Not on file  Food Insecurity: Not on file  Transportation Needs: Not on file  Physical Activity: Not on file  Stress: Not on file  Social Connections: Not on file    Review of Systems :see above  Vital Signs: BP 98/67 (BP Location: Right Arm)   Pulse Marland Kitchen)  105   Temp 100.1 F (37.8 C) (Rectal)   Resp 14   Ht '5\' 3"'$  (1.6 m)   Wt 170 lb (77.1 kg)   SpO2 95%   BMI 30.11 kg/m      Physical Exam : pt awake, follows few directions, not very interactive/conversant; chest- distant BS bilat; heart- tachycardic but reg rhythm; abd- dist, few BS, some diffuse tenderness to palpation esp RUQ; no sig LE edema  Imaging: DG Abdomen Acute W/Chest  Result Date: 04/14/2022 CLINICAL DATA:  88 year old male with history of abdominal distension. EXAM: DG ABDOMEN ACUTE WITH 1 VIEW CHEST COMPARISON:  Chest x-ray 08/27/2019. FINDINGS: Lung volumes are low with bibasilar areas of scarring and/or atelectasis. No consolidative airspace disease. No pleural effusions. No pneumothorax. No pulmonary nodule or mass noted. Pulmonary vasculature and the cardiomediastinal silhouette are within normal limits. Atherosclerotic calcifications are noted in the thoracic aorta. Multiple prominent gas-filled loops of small bowel fill the central abdomen, many of which are borderline dilated. Several air-fluid levels are noted in the small bowel. Large amount of gas and stool noted throughout the colon and distal rectum. No pneumoperitoneum. IMPRESSION: 1. Nonspecific bowel-gas pattern, as above. The possibility of early or partial small bowel obstruction should be considered, although not strongly favored on the basis of today's examination, particularly in light of the recent CT study. 2. No pneumoperitoneum. 3. Low lung volumes without radiographic evidence of acute cardiopulmonary disease. 4. Aortic atherosclerosis. Electronically Signed   By: Vinnie Langton M.D.   On: 04/14/2022 06:28   US Abdomen Limited RUQ (LIVER/GB)  Result Date: 04/13/2022 CLINICAL DATA:  Abdominal pain. EXAM: ULTRASOUND ABDOMEN LIMITED RIGHT UPPER QUADRANT COMPARISON:  CT abdomen and pelvis 04/13/2022 FINDINGS: Gallbladder: Thickened gallbladder wall measuring 5 mm. 1 cm gallstone. A positive sonographic  Percell Miller sign was reported by the sonographer. Common bile duct: Diameter: 3 mm Liver: Suboptimally assessed due to bowel gas with the known left hepatic lobe cysts on CT not clearly shown by ultrasound. Portal vein is patent on color Doppler imaging with normal direction of blood flow towards the liver. Other: None. IMPRESSION: Cholelithiasis with gallbladder wall thickening and positive sonographic Murphy sign, suspicious for acute cholecystitis. Electronically Signed   By: Logan Bores M.D.   On: 04/13/2022 13:28   CT ABDOMEN PELVIS W CONTRAST  Result Date: 04/13/2022 CLINICAL DATA:  Abdominal pain, acute nonlocalized. Symptoms for 12 hours. EXAM: CT ABDOMEN AND PELVIS WITH CONTRAST TECHNIQUE: Multidetector CT imaging of the abdomen and pelvis was performed using the standard protocol following bolus administration of intravenous contrast. RADIATION DOSE REDUCTION: This exam was performed according to the departmental dose-optimization program which includes automated exposure control, adjustment of the mA and/or kV according to patient size and/or use of iterative reconstruction technique. CONTRAST:  65m OMNIPAQUE IOHEXOL 300 MG/ML  SOLN COMPARISON:  Abdominopelvic CT 08/27/2019.  PET-CT 06/13/2019. FINDINGS: Lower chest: Interval mildly increased dependent opacities at both lung bases, likely reflecting atelectasis. No confluent airspace disease, residual suspicious nodularity or significant pleural effusion demonstrated. The heart is enlarged. There is atherosclerosis of the aorta and coronary arteries. Hepatobiliary: The liver is normal in density without suspicious focal abnormality. There are unchanged cysts within the left hepatic lobe. The gallbladder is mildly distended with mild wall thickening and surrounding inflammation. No calcified gallstones. No intra or extrahepatic biliary dilatation. Pancreas: Unremarkable. No pancreatic ductal dilatation or surrounding inflammatory changes. Spleen: Normal  in size without focal abnormality. Adrenals/Urinary Tract: Both adrenal glands appear normal. No evidence of  urinary tract calculus, suspicious renal lesion or hydronephrosis. Unchanged simple right renal cysts for which no follow-up imaging is recommended. The bladder appears unremarkable for its degree of distention. Stomach/Bowel: No enteric contrast administered. The stomach appears unremarkable for its degree of distention. Chronic nodular thickening along the lateral duodenal bulb measuring 1.6 x 1.2 cm on image 31/2 is unchanged. No evidence of bowel distension, wall thickening or surrounding inflammation. The appendix appears normal. Mildly prominent stool throughout the colon. There are diverticular changes in the descending and sigmoid colon. Vascular/Lymphatic: There are no enlarged abdominal or pelvic lymph nodes. Aortic and branch vessel atherosclerosis without evidence of aneurysm or large vessel occlusion. The portal, superior mesenteric and splenic veins are patent. Reproductive: Stable mild prostatomegaly. Other: No evidence of abdominal wall mass or hernia. No ascites. Musculoskeletal: No acute or significant osseous findings. Chronic bilateral L5 pars defects with associated grade 1 anterolisthesis and moderate foraminal narrowing bilaterally at L5-S1. IMPRESSION: 1. Distended gallbladder with mild wall thickening and surrounding inflammation suspicious for acute cholecystitis. No calcified gallstones or biliary dilatation. 2. No other acute findings or explanation for the patient's symptoms. 3. Distal colonic diverticulosis without evidence of acute inflammation. 4. Stable chronic nodular thickening along the lateral duodenal bulb. 5. Chronic bilateral L5 pars defects with associated grade 1 anterolisthesis and moderate foraminal narrowing bilaterally at L5-S1. 6. Previously demonstrated pulmonary nodularity is no longer clearly seen although may be obscured by increased atelectasis at both lung  bases. 7.  Aortic Atherosclerosis (ICD10-I70.0). Electronically Signed   By: Richardean Sale M.D.   On: 04/13/2022 11:31    Labs:  CBC: Recent Labs    08/22/21 1210 04/13/22 0936 04/14/22 0421  WBC 8.5 15.6* 15.9*  HGB 13.7 14.6 13.9  HCT 42.3 46.1 43.2  PLT 213 264 290    COAGS: No results for input(s): "INR", "APTT" in the last 8760 hours.  BMP: Recent Labs    08/22/21 1210 04/13/22 0936 04/14/22 0421  NA 141 136 137  K 3.9 4.1 5.4*  CL 105 102 103  CO2 '29 23 24  '$ GLUCOSE 88 197* 129*  BUN 31* 28* 32*  CALCIUM 9.4 9.0 8.5*  CREATININE 1.74* 1.43* 1.80*  GFRNONAA 38* 47* 36*    LIVER FUNCTION TESTS: Recent Labs    08/22/21 1210 04/13/22 0936 04/14/22 0421  BILITOT 0.5 0.8 0.4  AST 12* 26 18  ALT '11 28 23  '$ ALKPHOS 40 62 55  PROT 5.8* 6.0* 6.0*  ALBUMIN 3.5 2.9* 2.9*    TUMOR MARKERS: No results for input(s): "AFPTM", "CEA", "CA199", "CHROMGRNA" in the last 8760 hours.  Assessment and Plan: 88 y.o. male with past medical history of anemia, prior skin cancer, cerebrovascular disease, PVD, chronic gastric ulcer, chronic kidney disease, COPD, dementia, depression, CHF, diverticulosis,  gastroesophageal reflux, prior stroke on Plavix ,hypertension who is under hospice services and presents now with abd pain/distension/diarrhea and findings suggesting acute cholecystitis. Temp 100.1, BP soft, tachycardic, WBC 15.9, hgb nl, plts nl, creat 1.80, k 5.4; PT/INR pend;  blood cx pend; pt on IV flagyl and rocephin;  patient was seen by surgery and due to his advanced dementia is not a good surgical candidate.  Request now received for percutaneous cholecystostomy. Imaging studies have been reviewed by Dr. Earleen Newport.  Risks and benefits of procedure was discussed with the patient's spouse , incl but not limited to internal bleeding, infection, damage to adjacent structures .  All of the questions were answered and there is agreement to proceed.  Consent signed and in  chart.  Procedure scheduled for today   Thank you for this interesting consult.  I greatly enjoyed meeting Kevin Lynch. and look forward to participating in their care.  A copy of this report was sent to the requesting provider on this date.  Electronically Signed: D. Rowe Jonahtan, PA-C 04/14/2022, 9:18 AM   I spent a total of  25 minutes   in face to face in clinical consultation, greater than 50% of which was counseling/coordinating care for percutaneous cholecystostomy

## 2022-04-14 NOTE — Progress Notes (Signed)
Patient became extremely agitated upon arriving to unit. Patient was shouting continuously and demanding "it be taken off". Per IR RN's instructions, this RN gave patient 0.5 mg IV dilaudid prior to patient going down to IR for procedure. IVF also restarted to help maintain blood pressure. BP 102/65 at this time.

## 2022-04-14 NOTE — ED Notes (Signed)
Patient woke up yelling in pain, pulling his gown off saying its hurting me.  Patient abdomen is round and firm, tender to any touch.  Message sent to admitting MD to alert of sx.  Pain medication given per orders.

## 2022-04-14 NOTE — ED Notes (Signed)
Patient received from prior RN.  Patient is sleeping at this time.  He remains on cardiac monitoring

## 2022-04-15 DIAGNOSIS — K810 Acute cholecystitis: Secondary | ICD-10-CM | POA: Diagnosis not present

## 2022-04-15 LAB — PROCALCITONIN: Procalcitonin: 0.43 ng/mL

## 2022-04-15 LAB — COMPREHENSIVE METABOLIC PANEL
ALT: 16 U/L (ref 0–44)
AST: 18 U/L (ref 15–41)
Albumin: 2.3 g/dL — ABNORMAL LOW (ref 3.5–5.0)
Alkaline Phosphatase: 43 U/L (ref 38–126)
Anion gap: 8 (ref 5–15)
BUN: 36 mg/dL — ABNORMAL HIGH (ref 8–23)
CO2: 24 mmol/L (ref 22–32)
Calcium: 7.7 mg/dL — ABNORMAL LOW (ref 8.9–10.3)
Chloride: 109 mmol/L (ref 98–111)
Creatinine, Ser: 1.71 mg/dL — ABNORMAL HIGH (ref 0.61–1.24)
GFR, Estimated: 38 mL/min — ABNORMAL LOW (ref >60)
Glucose, Bld: 85 mg/dL (ref 70–99)
Potassium: 4.4 mmol/L (ref 3.5–5.1)
Sodium: 141 mmol/L (ref 135–145)
Total Bilirubin: 0.2 mg/dL — ABNORMAL LOW (ref 0.3–1.2)
Total Protein: 4.8 g/dL — ABNORMAL LOW (ref 6.5–8.1)

## 2022-04-15 LAB — CBC
HCT: 35.4 % — ABNORMAL LOW (ref 39.0–52.0)
Hemoglobin: 11 g/dL — ABNORMAL LOW (ref 13.0–17.0)
MCH: 30.8 pg (ref 26.0–34.0)
MCHC: 31.1 g/dL (ref 30.0–36.0)
MCV: 99.2 fL (ref 80.0–100.0)
Platelets: 203 10*3/uL (ref 150–400)
RBC: 3.57 MIL/uL — ABNORMAL LOW (ref 4.22–5.81)
RDW: 12.3 % (ref 11.5–15.5)
WBC: 7.7 10*3/uL (ref 4.0–10.5)
nRBC: 0 % (ref 0.0–0.2)

## 2022-04-15 MED ORDER — MIDODRINE HCL 5 MG PO TABS
5.0000 mg | ORAL_TABLET | Freq: Three times a day (TID) | ORAL | Status: DC
  Administered 2022-04-15 – 2022-04-16 (×3): 5 mg via ORAL
  Filled 2022-04-15 (×3): qty 1

## 2022-04-15 NOTE — Progress Notes (Signed)
Subjective/Chief Complaint: Feel much better today. Perc drain placed yesterday   Objective: Vital signs in last 24 hours: Temp:  [97.6 F (36.4 C)-98.5 F (36.9 C)] 97.7 F (36.5 C) (12/16 0300) Pulse Rate:  [66-121] 121 (12/16 0600) Resp:  [5-23] 15 (12/16 0600) BP: (84-147)/(40-108) 130/80 (12/16 0600) SpO2:  [88 %-99 %] 95 % (12/16 0600) Weight:  [73.9 kg] 73.9 kg (12/15 1140) Last BM Date : 04/12/22  Intake/Output from previous day: 12/15 0701 - 12/16 0700 In: 3063.2 [I.V.:1258.2; IV Piggyback:1800] Out: 171 [Urine:150; Drains:21] Intake/Output this shift: No intake/output data recorded.  General appearance: alert and cooperative Resp: clear to auscultation bilaterally Cardio: regular rate and rhythm GI: soft, mild RUQ tenderness. Drain output bilious  Lab Results:  Recent Labs    04/14/22 0421 04/15/22 0239  WBC 15.9* 7.7  HGB 13.9 11.0*  HCT 43.2 35.4*  PLT 290 203   BMET Recent Labs    04/14/22 0421 04/15/22 0239  NA 137 141  K 5.4* 4.4  CL 103 109  CO2 24 24  GLUCOSE 129* 85  BUN 32* 36*  CREATININE 1.80* 1.71*  CALCIUM 8.5* 7.7*   PT/INR Recent Labs    04/14/22 0918  LABPROT 16.6*  INR 1.4*   ABG No results for input(s): "PHART", "HCO3" in the last 72 hours.  Invalid input(s): "PCO2", "PO2"  Studies/Results: IR Perc Cholecystostomy  Result Date: 04/14/2022 INDICATION: 88 year old male presents for percutaneous cholecystostomy EXAM: CHOLECYSTOSTOMY MEDICATIONS: None. ANESTHESIA/SEDATION: Moderate (conscious) sedation was employed during this procedure. A total of Versed 0.5 mg and Fentanyl 50 mcg was administered intravenously. Moderate Sedation Time: 12 minutes. The patient's level of consciousness and vital signs were monitored continuously by radiology nursing throughout the procedure under my direct supervision. FLUOROSCOPY TIME:  Fluoroscopy Time: 0 minutes 30 seconds (3 mGy). COMPLICATIONS: None PROCEDURE: Informed written  consent was obtained from the patient and the patient's family after a thorough discussion of the procedural risks, benefits and alternatives. All questions were addressed. Maximal Sterile Barrier Technique was utilized including caps, mask, sterile gowns, sterile gloves, sterile drape, hand hygiene and skin antiseptic. A timeout was performed prior to the initiation of the procedure. Ultrasound survey of the right upper quadrant was performed for planning purposes. Once the patient is prepped and draped in the usual sterile fashion, the skin and subcutaneous tissues overlying the gallbladder were generously infiltrated 1% lidocaine for local anesthesia. A coaxial needle was advanced under ultrasound guidance through the skin subcutaneous tissues and a small segment of liver into the gallbladder lumen. With removal of the stylet, spontaneous dark bile drainage occurred. Using modified Seldinger technique, a 10 French drain was placed into the gallbladder fossa, with aspiration of the sample for the lab. Contrast injection confirmed position of the tube within the gallbladder lumen. Drainage catheter was attached to gravity drain with a suture retention placed. Patient tolerated the procedure well and remained hemodynamically stable throughout. No complications were encountered and no significant blood loss encountered. IMPRESSION: Status post image guided percutaneous cholecystostomy Signed, Dulcy Fanny. Nadene Rubins, RPVI [facility] Electronically Signed   By: Corrie Mckusick D.O.   On: 04/14/2022 13:57   DG Abdomen Acute W/Chest  Result Date: 04/14/2022 CLINICAL DATA:  88 year old male with history of abdominal distension. EXAM: DG ABDOMEN ACUTE WITH 1 VIEW CHEST COMPARISON:  Chest x-ray 08/27/2019. FINDINGS: Lung volumes are low with bibasilar areas of scarring and/or atelectasis. No consolidative airspace disease. No pleural effusions. No  pneumothorax. No pulmonary nodule or mass noted. Pulmonary vasculature and the cardiomediastinal silhouette are within normal limits. Atherosclerotic calcifications are noted in the thoracic aorta. Multiple prominent gas-filled loops of small bowel fill the central abdomen, many of which are borderline dilated. Several air-fluid levels are noted in the small bowel. Large amount of gas and stool noted throughout the colon and distal rectum. No pneumoperitoneum. IMPRESSION: 1. Nonspecific bowel-gas pattern, as above. The possibility of early or partial small bowel obstruction should be considered, although not strongly favored on the basis of today's examination, particularly in light of the recent CT study. 2. No pneumoperitoneum. 3. Low lung volumes without radiographic evidence of acute cardiopulmonary disease. 4. Aortic atherosclerosis. Electronically Signed   By: Vinnie Langton M.D.   On: 04/14/2022 06:28   US Abdomen Limited RUQ (LIVER/GB)  Result Date: 04/13/2022 CLINICAL DATA:  Abdominal pain. EXAM: ULTRASOUND ABDOMEN LIMITED RIGHT UPPER QUADRANT COMPARISON:  CT abdomen and pelvis 04/13/2022 FINDINGS: Gallbladder: Thickened gallbladder wall measuring 5 mm. 1 cm gallstone. A positive sonographic Percell Miller sign was reported by the sonographer. Common bile duct: Diameter: 3 mm Liver: Suboptimally assessed due to bowel gas with the known left hepatic lobe cysts on CT not clearly shown by ultrasound. Portal vein is patent on color Doppler imaging with normal direction of blood flow towards the liver. Other: None. IMPRESSION: Cholelithiasis with gallbladder wall thickening and positive sonographic Murphy sign, suspicious for acute cholecystitis. Electronically Signed   By: Logan Bores M.D.   On: 04/13/2022 13:28   CT ABDOMEN PELVIS W CONTRAST  Result Date: 04/13/2022 CLINICAL DATA:  Abdominal pain, acute nonlocalized. Symptoms for 12 hours. EXAM: CT ABDOMEN AND PELVIS WITH CONTRAST TECHNIQUE: Multidetector  CT imaging of the abdomen and pelvis was performed using the standard protocol following bolus administration of intravenous contrast. RADIATION DOSE REDUCTION: This exam was performed according to the departmental dose-optimization program which includes automated exposure control, adjustment of the mA and/or kV according to patient size and/or use of iterative reconstruction technique. CONTRAST:  54m OMNIPAQUE IOHEXOL 300 MG/ML  SOLN COMPARISON:  Abdominopelvic CT 08/27/2019.  PET-CT 06/13/2019. FINDINGS: Lower chest: Interval mildly increased dependent opacities at both lung bases, likely reflecting atelectasis. No confluent airspace disease, residual suspicious nodularity or significant pleural effusion demonstrated. The heart is enlarged. There is atherosclerosis of the aorta and coronary arteries. Hepatobiliary: The liver is normal in density without suspicious focal abnormality. There are unchanged cysts within the left hepatic lobe. The gallbladder is mildly distended with mild wall thickening and surrounding inflammation. No calcified gallstones. No intra or extrahepatic biliary dilatation. Pancreas: Unremarkable. No pancreatic ductal dilatation or surrounding inflammatory changes. Spleen: Normal in size without focal abnormality. Adrenals/Urinary Tract: Both adrenal glands appear normal. No evidence of urinary tract calculus, suspicious renal lesion or hydronephrosis. Unchanged simple right renal cysts for which no follow-up imaging is recommended. The bladder appears unremarkable for its degree of distention. Stomach/Bowel: No enteric contrast administered. The stomach appears unremarkable for its degree of distention. Chronic nodular thickening along the lateral duodenal bulb measuring 1.6 x 1.2 cm on image 31/2 is unchanged. No evidence of bowel distension, wall thickening or surrounding inflammation. The appendix appears normal. Mildly prominent stool throughout the colon. There are diverticular  changes in the descending and sigmoid colon. Vascular/Lymphatic: There are no enlarged abdominal or pelvic lymph nodes. Aortic and branch vessel atherosclerosis without evidence of aneurysm or large vessel occlusion. The portal, superior mesenteric and splenic veins are patent. Reproductive: Stable mild prostatomegaly. Other: No  evidence of abdominal wall mass or hernia. No ascites. Musculoskeletal: No acute or significant osseous findings. Chronic bilateral L5 pars defects with associated grade 1 anterolisthesis and moderate foraminal narrowing bilaterally at L5-S1. IMPRESSION: 1. Distended gallbladder with mild wall thickening and surrounding inflammation suspicious for acute cholecystitis. No calcified gallstones or biliary dilatation. 2. No other acute findings or explanation for the patient's symptoms. 3. Distal colonic diverticulosis without evidence of acute inflammation. 4. Stable chronic nodular thickening along the lateral duodenal bulb. 5. Chronic bilateral L5 pars defects with associated grade 1 anterolisthesis and moderate foraminal narrowing bilaterally at L5-S1. 6. Previously demonstrated pulmonary nodularity is no longer clearly seen although may be obscured by increased atelectasis at both lung bases. 7.  Aortic Atherosclerosis (ICD10-I70.0). Electronically Signed   By: Richardean Sale M.D.   On: 04/13/2022 11:31    Anti-infectives: Anti-infectives (From admission, onward)    Start     Dose/Rate Route Frequency Ordered Stop   04/13/22 2000  cefTRIAXone (ROCEPHIN) 2 g in sodium chloride 0.9 % 100 mL IVPB        2 g 200 mL/hr over 30 Minutes Intravenous Every 24 hours 04/13/22 1541     04/13/22 2000  metroNIDAZOLE (FLAGYL) IVPB 500 mg        500 mg 100 mL/hr over 60 Minutes Intravenous Every 8 hours 04/13/22 1541     04/13/22 1215  piperacillin-tazobactam (ZOSYN) IVPB 3.375 g        3.375 g 100 mL/hr over 30 Minutes Intravenous  Once 04/13/22 1213 04/13/22 1506        Assessment/Plan: s/p * No surgery found * Advance diet Continue abx and perc drain for cholecystitis Acute cholecystitis  - RUQ [place] with cholelithiasis, gallbladder wall thickening and positive sonographic murphy sign  - WBC down after perc drain on abx. LFTs normal. Worsening AKI with sCr 1.8 - hypotensive and tachycardic this am. Recommend a percutaneous cholecystostomy for source control. I discussed this with his wife who is in agreement and have reached out to IR   FEN: NPO ID: [email or web address] in ED. Rocephin/flagyl VTE: lovenox, hold plavix   - below per TRH -  Hx of CVA on plavix - LD am 12/14 Hx of melanoma  COPD Chronic diastolic CHF HTN HLD CKD Peripheral vascular disease GERD Vascular dementia  Hx of gastric ulcer  BPH Anemia of chronic disease   LOS: 2 days    Autumn Messing III 04/15/2022

## 2022-04-15 NOTE — Progress Notes (Addendum)
PROGRESS NOTE  Kevin Lynch.  WIO:[id number] DOB: 1933-03-04 DOA: 04/13/2022 PCP: Renaldo Reel, PA   Brief Narrative: Kevin Lynch. is a 88 y.o. male with medical history significant of advanced dementia, currently under hospice services, CVA, vascular dementia, hypertension, COPD, depression, diastolic CHF who presented from home with complaint of abdominal pain. On presentation, he was hemodynamically stable.  Lab work showed creatinine of 1.4.  WBC count of 15.6.  CT abdomen/pelvis and [place] RUQ suggestive of  acute cholecystitis.General surgery consulted.  Due to his advanced dementia, he was not a good surgical candidate as per surgery.  IR consulted and he underwent right upper quadrant drain.  Assessment & Plan:   Severe sepsis/septic shock: Became hypotensive, febrile with leukocytosis.  Started IV fluids, culture sent, no growth till date.  Sepsis physiology is improving.  Blood pressure soft, started on midodrine  Acute cholecystitis: Imaging showed cholelithiasis with gallbladder inflammation consistent with acute cholecystitis.  General surgery following.  Due to his advanced age and dementia, surgery not recommended.  Underwent right upper quadrant drain placement by IR.  Continue antibiotics.  Leukocytosis has resolved.  Started on clear liquid diet  CKD stage 3a: Baseline creatinine range of 1.6-1.7.Currently  kidney function near baseline    history of COPD: Currently not on exacerbation.  Continue home inhaler.  As needed bronchodilators.  Not on oxygen at home.  Currently on 2 L of oxygen for comfort   Hypertension: Takes metoprolol, ARB, spironolactone at home.  These are on hold, blood pressure improved   Diastolic CHF: Continue IV fluids for now .Marland Kitchen  Currently looks euvolemic.  Last echo showed EF of 42%, grade 1 diastolic dysfunction   History of stroke/vascular dementia: Patient has dementia at baseline.  Remains confused.  Alert and oriented to self only.  Continue  delirium precautions   BPH: On terazosin   History of depression: On Celexa   Goals of care: CODE STATUS DNR, confirmed with wife.  Patient is under hospice services at home. Elderly patient with dementia presented with acute cholecystitis.  Not a candidate surgery.  High risk for decompensation.  Palliative care consulted.Wife says that we can use vasopressors if it helps.         DVT prophylaxis:enoxaparin (LOVENOX) injection 30 mg Start: 04/14/22 2200     Code Status: DNR  Family Communication: Wife at bedside on 12/16  Patient status:Inpatient  Patient is from :Home  Anticipated discharge AS:TMHD  Estimated DC date:not sure   Consultants: General surgery, IR  Procedures: Right upper quadrant drain  Antimicrobials:  Anti-infectives (From admission, onward)    Start     Dose/Rate Route Frequency Ordered Stop   04/13/22 2000  cefTRIAXone (ROCEPHIN) 2 g in sodium chloride 0.9 % 100 mL IVPB        2 g 200 mL/hr over 30 Minutes Intravenous Every 24 hours 04/13/22 1541     04/13/22 2000  metroNIDAZOLE (FLAGYL) IVPB 500 mg        500 mg 100 mL/hr over 60 Minutes Intravenous Every 8 hours 04/13/22 1541     04/13/22 1215  piperacillin-tazobactam (ZOSYN) IVPB 3.375 g        3.375 g 100 mL/hr over 30 Minutes Intravenous  Once 04/13/22 1213 04/13/22 1506       Subjective: Patient seen and examined at bedside this morning.  Looks improved.  Lying in bed.  Wife at bedside.    Abdomen is soft today.  Bowel sounds heard.  He denies any abdominal pain.  He is oriented to self but answers to questions  Objective: Vitals:   04/15/22 0700 04/15/22 0800 04/15/22 0843 04/15/22 0900  BP: (!) 80/33 (!) 110/46  (!) 98/44  Pulse: 68 63 62 (!) 59  Resp: (!) 8 14 (!) 9 10  Temp:   (!) 97.5 F (36.4 C)   TempSrc:   Axillary   SpO2: 91% 95% 94% 93%  Weight:      Height:        Intake/Output Summary (Last 24 hours) at 04/15/2022 1056 Last data filed at 04/14/2022 1836 Gross  per 24 hour  Intake 1063.17 ml  Output 171 ml  Net 892.17 ml   Filed Weights   04/13/22 1723 04/14/22 1140  Weight: 77.1 kg 73.9 kg    Examination:    General exam: Overall comfortable, not in distress, pleasantly confused HEENT: PERRL Respiratory system:  no wheezes or crackles, diminished air sound bilateral Cardiovascular system: S1 & S2 heard, RRR.  Gastrointestinal system: Abdomen is mildly distended, soft and nontender.  Right upper quadrant drain Central nervous system: Alert and awake, oriented to self only Extremities: No edema, no clubbing ,no cyanosis Skin: No rashes, no ulcers,no icterus     Data Reviewed: I have personally reviewed following labs and imaging studies  CBC: Recent Labs  Lab 04/13/22 0936 04/14/22 0421 04/15/22 0239  WBC 15.6* 15.9* 7.7  HGB 14.6 13.9 11.0*  HCT 46.1 43.2 35.4*  MCV 96.0 95.6 99.2  PLT 264 290 382   Basic Metabolic Panel: Recent Labs  Lab 04/13/22 0936 04/14/22 0421 04/15/22 0239  NA 136 137 141  K 4.1 5.4* 4.4  CL 102 103 109  CO2 '23 24 24  '$ GLUCOSE 197* 129* 85  BUN 28* 32* 36*  CREATININE 1.43* 1.80* 1.71*  CALCIUM 9.0 8.5* 7.7*     Recent Results (from the past 240 hour(s))  Culture, blood (Routine X 2) w Reflex to ID Panel     Status: None (Preliminary result)   Collection Time: 04/13/22  8:28 PM   Specimen: BLOOD  Result Value Ref Range Status   Specimen Description   Final    BLOOD BLOOD RIGHT HAND Performed at [facility], [place] [address], [place], [place] [address]    Special Requests   Final    BOTTLES DRAWN AEROBIC AND ANAEROBIC Blood Culture results may not be optimal due to an excessive volume of blood received in culture bottles Performed at [facility] [address], [place], [place] [address]    Culture   Final    NO GROWTH 2 DAYS Performed at [facility] [address] [place], [place] [address]    Report Status PENDING  Incomplete   Culture, blood (Routine X 2) w Reflex to ID Panel     Status: None (Preliminary result)   Collection Time: 04/13/22  8:28 PM   Specimen: BLOOD  Result Value Ref Range Status   Specimen Description   Final    BLOOD BLOOD LEFT HAND Performed at [facility] [address], [place], [place] [address]    Special Requests   Final    BOTTLES DRAWN AEROBIC AND ANAEROBIC Blood Culture adequate volume Performed at [facility] [address], [place], [place] [address]    Culture   Final    NO GROWTH 2 DAYS Performed at [facility] [address] [place], [place] [address]  Report Status PENDING  Incomplete  MRSA Next Gen by PCR, Nasal     Status: None   Collection Time: 04/14/22 11:37 AM   Specimen: Nasal Mucosa; Nasal Swab  Result Value Ref Range Status   MRSA by PCR Next Gen NOT DETECTED NOT DETECTED Final    Comment: (NOTE) The GeneXpert MRSA Assay (FDA approved for NASAL specimens only), is one component of a comprehensive MRSA colonization surveillance program. It is not intended to diagnose MRSA infection nor to guide or monitor treatment for MRSA infections. Test performance is not FDA approved in patients less than 2 years old. Performed at [facility], [place] [address], [place], [place] [address]   Aerobic/Anaerobic Culture w Gram Stain (surgical/deep wound)     Status: None (Preliminary result)   Collection Time: 04/14/22 12:50 PM   Specimen: BILE  Result Value Ref Range Status   Specimen Description   Final    BILE Performed at [facility] [address] [place], [place] [address]    Special Requests   Final    NONE Performed at [facility], [place] [address], [place], [place] [address]    Gram Stain NO WBC SEEN NO ORGANISMS SEEN   Final   Culture   Final    NO GROWTH < 24 HOURS Performed at [facility], [place] [address], [place], [place] [address]     Report Status PENDING  Incomplete     Radiology Studies: IR Perc Cholecystostomy  Result Date: 04/14/2022 INDICATION: 88 year old male presents for percutaneous cholecystostomy EXAM: CHOLECYSTOSTOMY MEDICATIONS: None. ANESTHESIA/SEDATION: Moderate (conscious) sedation was employed during this procedure. A total of Versed 0.5 mg and Fentanyl 50 mcg was administered intravenously. Moderate Sedation Time: 12 minutes. The patient's level of consciousness and vital signs were monitored continuously by radiology nursing throughout the procedure under my direct supervision. FLUOROSCOPY TIME:  Fluoroscopy Time: 0 minutes 30 seconds (3 mGy). COMPLICATIONS: None PROCEDURE: Informed written consent was obtained from the patient and the patient's family after a thorough discussion of the procedural risks, benefits and alternatives. All questions were addressed. Maximal Sterile Barrier Technique was utilized including caps, mask, sterile gowns, sterile gloves, sterile drape, hand hygiene and skin antiseptic. A timeout was performed prior to the initiation of the procedure. Ultrasound survey of the right upper quadrant was performed for planning purposes. Once the patient is prepped and draped in the usual sterile fashion, the skin and subcutaneous tissues overlying the gallbladder were generously infiltrated 1% lidocaine for local anesthesia. A coaxial needle was advanced under ultrasound guidance through the skin subcutaneous tissues and a small segment of liver into the gallbladder lumen. With removal of the stylet, spontaneous dark bile drainage occurred. Using modified Seldinger technique, a 10 French drain was placed into the gallbladder fossa, with aspiration of the sample for the lab. Contrast injection confirmed position of the tube within the gallbladder lumen. Drainage catheter was attached to gravity drain with a suture retention placed. Patient tolerated the procedure well and remained hemodynamically  stable throughout. No complications were encountered and no significant blood loss encountered. IMPRESSION: Status post image guided percutaneous cholecystostomy Signed, Dulcy Fanny. Nadene Rubins, RPVI [facility] Electronically Signed   By: Corrie Mckusick D.O.   On: 04/14/2022 13:57   DG Abdomen Acute W/Chest  Result Date: 04/14/2022 CLINICAL DATA:  88 year old male with history of abdominal distension. EXAM: DG ABDOMEN ACUTE WITH 1 VIEW CHEST COMPARISON:  Chest  x-ray 08/27/2019. FINDINGS: Lung volumes are low with bibasilar areas of scarring and/or atelectasis. No consolidative airspace disease. No pleural effusions. No pneumothorax. No pulmonary nodule or mass noted. Pulmonary vasculature and the cardiomediastinal silhouette are within normal limits. Atherosclerotic calcifications are noted in the thoracic aorta. Multiple prominent gas-filled loops of small bowel fill the central abdomen, many of which are borderline dilated. Several air-fluid levels are noted in the small bowel. Large amount of gas and stool noted throughout the colon and distal rectum. No pneumoperitoneum. IMPRESSION: 1. Nonspecific bowel-gas pattern, as above. The possibility of early or partial small bowel obstruction should be considered, although not strongly favored on the basis of today's examination, particularly in light of the recent CT study. 2. No pneumoperitoneum. 3. Low lung volumes without radiographic evidence of acute cardiopulmonary disease. 4. Aortic atherosclerosis. Electronically Signed   By: Vinnie Langton M.D.   On: 04/14/2022 06:28   US Abdomen Limited RUQ (LIVER/GB)  Result Date: 04/13/2022 CLINICAL DATA:  Abdominal pain. EXAM: ULTRASOUND ABDOMEN LIMITED RIGHT UPPER QUADRANT COMPARISON:  CT abdomen and pelvis 04/13/2022 FINDINGS: Gallbladder: Thickened gallbladder wall measuring 5 mm. 1 cm gallstone. A positive sonographic Percell Miller sign was reported by  the sonographer. Common bile duct: Diameter: 3 mm Liver: Suboptimally assessed due to bowel gas with the known left hepatic lobe cysts on CT not clearly shown by ultrasound. Portal vein is patent on color Doppler imaging with normal direction of blood flow towards the liver. Other: None. IMPRESSION: Cholelithiasis with gallbladder wall thickening and positive sonographic Murphy sign, suspicious for acute cholecystitis. Electronically Signed   By: Logan Bores M.D.   On: 04/13/2022 13:28   CT ABDOMEN PELVIS W CONTRAST  Result Date: 04/13/2022 CLINICAL DATA:  Abdominal pain, acute nonlocalized. Symptoms for 12 hours. EXAM: CT ABDOMEN AND PELVIS WITH CONTRAST TECHNIQUE: Multidetector CT imaging of the abdomen and pelvis was performed using the standard protocol following bolus administration of intravenous contrast. RADIATION DOSE REDUCTION: This exam was performed according to the departmental dose-optimization program which includes automated exposure control, adjustment of the mA and/or kV according to patient size and/or use of iterative reconstruction technique. CONTRAST:  69m OMNIPAQUE IOHEXOL 300 MG/ML  SOLN COMPARISON:  Abdominopelvic CT 08/27/2019.  PET-CT 06/13/2019. FINDINGS: Lower chest: Interval mildly increased dependent opacities at both lung bases, likely reflecting atelectasis. No confluent airspace disease, residual suspicious nodularity or significant pleural effusion demonstrated. The heart is enlarged. There is atherosclerosis of the aorta and coronary arteries. Hepatobiliary: The liver is normal in density without suspicious focal abnormality. There are unchanged cysts within the left hepatic lobe. The gallbladder is mildly distended with mild wall thickening and surrounding inflammation. No calcified gallstones. No intra or extrahepatic biliary dilatation. Pancreas: Unremarkable. No pancreatic ductal dilatation or surrounding inflammatory changes. Spleen: Normal in size without focal  abnormality. Adrenals/Urinary Tract: Both adrenal glands appear normal. No evidence of urinary tract calculus, suspicious renal lesion or hydronephrosis. Unchanged simple right renal cysts for which no follow-up imaging is recommended. The bladder appears unremarkable for its degree of distention. Stomach/Bowel: No enteric contrast administered. The stomach appears unremarkable for its degree of distention. Chronic nodular thickening along the lateral duodenal bulb measuring 1.6 x 1.2 cm on image 31/2 is unchanged. No evidence of bowel distension, wall thickening or surrounding inflammation. The appendix appears normal. Mildly prominent stool throughout the colon. There are diverticular changes in the descending and sigmoid colon. Vascular/Lymphatic: There are no enlarged abdominal or pelvic lymph nodes. Aortic and branch vessel atherosclerosis without  evidence of aneurysm or large vessel occlusion. The portal, superior mesenteric and splenic veins are patent. Reproductive: Stable mild prostatomegaly. Other: No evidence of abdominal wall mass or hernia. No ascites. Musculoskeletal: No acute or significant osseous findings. Chronic bilateral L5 pars defects with associated grade 1 anterolisthesis and moderate foraminal narrowing bilaterally at L5-S1. IMPRESSION: 1. Distended gallbladder with mild wall thickening and surrounding inflammation suspicious for acute cholecystitis. No calcified gallstones or biliary dilatation. 2. No other acute findings or explanation for the patient's symptoms. 3. Distal colonic diverticulosis without evidence of acute inflammation. 4. Stable chronic nodular thickening along the lateral duodenal bulb. 5. Chronic bilateral L5 pars defects with associated grade 1 anterolisthesis and moderate foraminal narrowing bilaterally at L5-S1. 6. Previously demonstrated pulmonary nodularity is no longer clearly seen although may be obscured by increased atelectasis at both lung bases. 7.  Aortic  Atherosclerosis (ICD10-I70.0). Electronically Signed   By: Richardean Sale M.D.   On: 04/13/2022 11:31    Scheduled Meds:  Chlorhexidine Gluconate Cloth  6 each Topical Daily   enoxaparin (LOVENOX) injection  30 mg Subcutaneous Q24H   escitalopram  20 mg Oral Daily   fluticasone furoate-vilanterol  1 puff Inhalation Daily   And   umeclidinium bromide  1 puff Inhalation Daily   polyethylene glycol  17 g Oral q AM   QUEtiapine  50 mg Oral BID   senna-docusate  1 tablet Oral QHS   senna-docusate  2 tablet Oral Daily   sodium chloride flush  5 mL Intracatheter Q8H   terazosin  5 mg Oral QHS   traZODone  25 mg Oral QHS   Continuous Infusions:  sodium chloride 125 mL/hr at 04/14/22 1550   cefTRIAXone (ROCEPHIN)  IV Stopped (04/14/22 2051)   metronidazole Stopped (04/15/22 1751)     LOS: 2 days   Shelly Coss, MD [facility] P12/16/2023, 10:56 AM

## 2022-04-15 NOTE — Progress Notes (Signed)
Referring Physician(s): Jeanmarie Hubert  Supervising Physician: Jacqulynn Cadet  Patient Status:  [facility] - In-pt  Chief Complaint:  Abdominal pain/cholecystitis  Subjective: Pt doing better since GB drain placed yesterday; keeps eyes closed, responds briefly to simple questions; daughter in room  Allergies: Shellfish allergy, Codeine, and Lisinopril  Medications: Prior to Admission medications   Medication Sig Start Date End Date Taking? Authorizing Provider  albuterol (PROVENTIL) (2.5 MG/3ML) 0.083% nebulizer solution Take 2.5 mg by nebulization every 6 (six) hours as needed for wheezing or shortness of breath.   Yes [provider]  bisacodyl (DULCOLAX) 10 MG suppository Place 10 mg rectally as needed for mild constipation or moderate constipation.   Yes [provider]  Cholecalciferol (VITAMIN D3) 1000 units CAPS Take 1,000 Units by mouth in the morning and at bedtime.   Yes [provider]  CLEAR EYES FOR DRY EYES 1-0.25 % SOLN Place 1 drop into both eyes 4 (four) times daily as needed (for dryness).   Yes [provider]  clopidogrel (PLAVIX) 75 MG tablet Take 37.5 mg by mouth every other day.   Yes [provider]  COLACE 100 MG capsule Take 200 mg by mouth in the morning and at bedtime.   Yes [provider]  escitalopram (LEXAPRO) 20 MG tablet Take 20 mg by mouth at bedtime.   Yes [provider]  gabapentin (NEURONTIN) 100 MG capsule Take 100 mg by mouth at bedtime.   Yes [provider]  ibuprofen (ADVIL) 200 MG tablet Take 400 mg by mouth in the morning.   Yes [provider]  loperamide (IMODIUM A-D) 2 MG tablet Take 2 mg by mouth 4 (four) times daily as needed for diarrhea or loose stools.   Yes [provider]  LORazepam (ATIVAN) 0.5 MG tablet Take 0.5 mg by mouth See admin instructions. Take 0.5 mg by mouth at bedtime and an additional 0.5 mg every 4 hours as needed for agitation  or anxiousness   Yes [provider]  metoprolol succinate (TOPROL-XL) 25 MG 24 hr tablet Take 12.5 mg by mouth in the morning.   Yes [provider]  ondansetron (ZOFRAN) 4 MG tablet Take 4 mg by mouth in the morning and at bedtime. 04/03/22  Yes [provider]  PEPTO-BISMOL 262 MG/15ML suspension Take 30 mLs by mouth 2 (two) times daily as needed for indigestion or diarrhea or loose stools.   Yes [provider]  polyethylene glycol powder (GLYCOLAX/MIRALAX) 17 GM/SCOOP powder Take 17 g by mouth in the morning.   Yes [provider]  PREPARATION H 0.25-14-74.9 % rectal ointment Place rectally 3 (three) times daily as needed for hemorrhoids. 02/28/22  Yes [provider]  QUEtiapine (SEROQUEL) 50 MG tablet Take 50 mg by mouth in the morning and at bedtime.   Yes [provider]  SARNA SENSITIVE 1 % LOTN Apply 1 Application topically See admin instructions. Apply to the back after each shower   Yes [provider]  sennosides-docusate sodium (SENOKOT-S) 8.6-50 MG tablet Take 1-2 tablets by mouth See admin instructions. Take 2 tablets by mouth in the morning and 1 tablet at bedtime   Yes [provider]  spironolactone (ALDACTONE) 25 MG tablet Take 25 mg by mouth every evening.   Yes [provider]  terazosin (HYTRIN) 5 MG capsule Take 5 mg by mouth at bedtime.   Yes [provider]  traZODone (DESYREL) 50 MG tablet Take 25 mg by mouth at  bedtime.   Yes [provider]  TRELEGY ELLIPTA 100-62.5-25 MCG/INH AEPB Inhale 1 puff into the lungs See admin instructions. Inhale 1 puff into the lungs every 4 days 06/05/20  Yes [provider]  valsartan (DIOVAN) 80 MG tablet Take 40 mg by mouth in the morning.   Yes [provider]  donepezil (ARICEPT) 10 MG tablet Take 10 mg by mouth at bedtime. Patient not taking: Reported on 04/13/2022    [provider]  memantine (NAMENDA)  10 MG tablet Take 10 mg by mouth 2 (two) times daily.  Patient not taking: Reported on 04/13/2022 10/27/17   [provider]  QUEtiapine (SEROQUEL) 25 MG tablet Take 25 mg by mouth at bedtime. Patient not taking: Reported on 04/13/2022 10/22/19   [provider]     Vital Signs: BP (!) 98/44   Pulse (!) 59   Temp (!) 97.5 F (36.4 C) (Axillary)   Resp 10   Ht '5\' 3"'$  (1.6 m)   Wt 162 lb 14.7 oz (73.9 kg)   SpO2 93%   BMI 28.86 kg/m   Physical Exam pt with eyes closed; not agitated; responds briefly to questions; GB drain intact, insertion site ok, mildly tender, abd not as distended today; OP about 25-30 cc dark bile in bag; drain flushes ok  Imaging: IR Perc Cholecystostomy  Result Date: 04/14/2022 INDICATION: 88 year old male presents for percutaneous cholecystostomy EXAM: CHOLECYSTOSTOMY MEDICATIONS: None. ANESTHESIA/SEDATION: Moderate (conscious) sedation was employed during this procedure. A total of Versed 0.5 mg and Fentanyl 50 mcg was administered intravenously. Moderate Sedation Time: 12 minutes. The patient's level of consciousness and vital signs were monitored continuously by radiology nursing throughout the procedure under my direct supervision. FLUOROSCOPY TIME:  Fluoroscopy Time: 0 minutes 30 seconds (3 mGy). COMPLICATIONS: None PROCEDURE: Informed written consent was obtained from the patient and the patient's family after a thorough discussion of the procedural risks, benefits and alternatives. All questions were addressed. Maximal Sterile Barrier Technique was utilized including caps, mask, sterile gowns, sterile gloves, sterile drape, hand hygiene and skin antiseptic. A timeout was performed prior to the initiation of the procedure. Ultrasound survey of the right upper quadrant was performed for planning purposes. Once the patient is prepped and draped in the usual sterile fashion, the skin and subcutaneous tissues overlying the gallbladder were generously  infiltrated 1% lidocaine for local anesthesia. A coaxial needle was advanced under ultrasound guidance through the skin subcutaneous tissues and a small segment of liver into the gallbladder lumen. With removal of the stylet, spontaneous dark bile drainage occurred. Using modified Seldinger technique, a 10 French drain was placed into the gallbladder fossa, with aspiration of the sample for the lab. Contrast injection confirmed position of the tube within the gallbladder lumen. Drainage catheter was attached to gravity drain with a suture retention placed. Patient tolerated the procedure well and remained hemodynamically stable throughout. No complications were encountered and no significant blood loss encountered. IMPRESSION: Status post image guided percutaneous cholecystostomy Signed, Dulcy Fanny. Nadene Rubins, RPVI [facility] Electronically Signed   By: Corrie Mckusick D.O.   On: 04/14/2022 13:57   DG Abdomen Acute W/Chest  Result Date: 04/14/2022 CLINICAL DATA:  88 year old male with history of abdominal distension. EXAM: DG ABDOMEN ACUTE WITH 1 VIEW CHEST COMPARISON:  Chest x-ray 08/27/2019. FINDINGS: Lung volumes are low with bibasilar areas of scarring and/or atelectasis. No consolidative airspace disease. No pleural effusions. No pneumothorax. No pulmonary nodule or mass noted.  Pulmonary vasculature and the cardiomediastinal silhouette are within normal limits. Atherosclerotic calcifications are noted in the thoracic aorta. Multiple prominent gas-filled loops of small bowel fill the central abdomen, many of which are borderline dilated. Several air-fluid levels are noted in the small bowel. Large amount of gas and stool noted throughout the colon and distal rectum. No pneumoperitoneum. IMPRESSION: 1. Nonspecific bowel-gas pattern, as above. The possibility of early or partial small bowel obstruction should be considered, although not strongly  favored on the basis of today's examination, particularly in light of the recent CT study. 2. No pneumoperitoneum. 3. Low lung volumes without radiographic evidence of acute cardiopulmonary disease. 4. Aortic atherosclerosis. Electronically Signed   By: Vinnie Langton M.D.   On: 04/14/2022 06:28   US Abdomen Limited RUQ (LIVER/GB)  Result Date: 04/13/2022 CLINICAL DATA:  Abdominal pain. EXAM: ULTRASOUND ABDOMEN LIMITED RIGHT UPPER QUADRANT COMPARISON:  CT abdomen and pelvis 04/13/2022 FINDINGS: Gallbladder: Thickened gallbladder wall measuring 5 mm. 1 cm gallstone. A positive sonographic Percell Miller sign was reported by the sonographer. Common bile duct: Diameter: 3 mm Liver: Suboptimally assessed due to bowel gas with the known left hepatic lobe cysts on CT not clearly shown by ultrasound. Portal vein is patent on color Doppler imaging with normal direction of blood flow towards the liver. Other: None. IMPRESSION: Cholelithiasis with gallbladder wall thickening and positive sonographic Murphy sign, suspicious for acute cholecystitis. Electronically Signed   By: Logan Bores M.D.   On: 04/13/2022 13:28   CT ABDOMEN PELVIS W CONTRAST  Result Date: 04/13/2022 CLINICAL DATA:  Abdominal pain, acute nonlocalized. Symptoms for 12 hours. EXAM: CT ABDOMEN AND PELVIS WITH CONTRAST TECHNIQUE: Multidetector CT imaging of the abdomen and pelvis was performed using the standard protocol following bolus administration of intravenous contrast. RADIATION DOSE REDUCTION: This exam was performed according to the departmental dose-optimization program which includes automated exposure control, adjustment of the mA and/or kV according to patient size and/or use of iterative reconstruction technique. CONTRAST:  2m OMNIPAQUE IOHEXOL 300 MG/ML  SOLN COMPARISON:  Abdominopelvic CT 08/27/2019.  PET-CT 06/13/2019. FINDINGS: Lower chest: Interval mildly increased dependent opacities at both lung bases, likely reflecting atelectasis.  No confluent airspace disease, residual suspicious nodularity or significant pleural effusion demonstrated. The heart is enlarged. There is atherosclerosis of the aorta and coronary arteries. Hepatobiliary: The liver is normal in density without suspicious focal abnormality. There are unchanged cysts within the left hepatic lobe. The gallbladder is mildly distended with mild wall thickening and surrounding inflammation. No calcified gallstones. No intra or extrahepatic biliary dilatation. Pancreas: Unremarkable. No pancreatic ductal dilatation or surrounding inflammatory changes. Spleen: Normal in size without focal abnormality. Adrenals/Urinary Tract: Both adrenal glands appear normal. No evidence of urinary tract calculus, suspicious renal lesion or hydronephrosis. Unchanged simple right renal cysts for which no follow-up imaging is recommended. The bladder appears unremarkable for its degree of distention. Stomach/Bowel: No enteric contrast administered. The stomach appears unremarkable for its degree of distention. Chronic nodular thickening along the lateral duodenal bulb measuring 1.6 x 1.2 cm on image 31/2 is unchanged. No evidence of bowel distension, wall thickening or surrounding inflammation. The appendix appears normal. Mildly prominent stool throughout the colon. There are diverticular changes in the descending and sigmoid colon. Vascular/Lymphatic: There are no enlarged abdominal or pelvic lymph nodes. Aortic and branch vessel atherosclerosis without evidence of aneurysm or large vessel occlusion. The portal, superior mesenteric and splenic veins are patent. Reproductive: Stable mild prostatomegaly. Other: No evidence of abdominal wall mass or hernia.  No ascites. Musculoskeletal: No acute or significant osseous findings. Chronic bilateral L5 pars defects with associated grade 1 anterolisthesis and moderate foraminal narrowing bilaterally at L5-S1. IMPRESSION: 1. Distended gallbladder with mild wall  thickening and surrounding inflammation suspicious for acute cholecystitis. No calcified gallstones or biliary dilatation. 2. No other acute findings or explanation for the patient's symptoms. 3. Distal colonic diverticulosis without evidence of acute inflammation. 4. Stable chronic nodular thickening along the lateral duodenal bulb. 5. Chronic bilateral L5 pars defects with associated grade 1 anterolisthesis and moderate foraminal narrowing bilaterally at L5-S1. 6. Previously demonstrated pulmonary nodularity is no longer clearly seen although may be obscured by increased atelectasis at both lung bases. 7.  Aortic Atherosclerosis (ICD10-I70.0). Electronically Signed   By: Richardean Sale M.D.   On: 04/13/2022 11:31    Labs:  CBC: Recent Labs    08/22/21 1210 04/13/22 0936 04/14/22 0421 04/15/22 0239  WBC 8.5 15.6* 15.9* 7.7  HGB 13.7 14.6 13.9 11.0*  HCT 42.3 46.1 43.2 35.4*  PLT 213 264 290 203    COAGS: Recent Labs    04/14/22 0918  INR 1.4*    BMP: Recent Labs    08/22/21 1210 04/13/22 0936 04/14/22 0421 04/15/22 0239  NA 141 136 137 141  K 3.9 4.1 5.4* 4.4  CL 105 102 103 109  CO2 '29 23 24 24  '$ GLUCOSE 88 197* 129* 85  BUN 31* 28* 32* 36*  CALCIUM 9.4 9.0 8.5* 7.7*  CREATININE 1.74* 1.43* 1.80* 1.71*  GFRNONAA 38* 47* 36* 38*    LIVER FUNCTION TESTS: Recent Labs    08/22/21 1210 04/13/22 0936 04/14/22 0421 04/15/22 0239  BILITOT 0.5 0.8 0.4 0.2*  AST 12* '26 18 18  '$ ALT '11 28 23 16  '$ ALKPHOS 40 62 55 43  PROT 5.8* 6.0* 6.0* 4.8*  ALBUMIN 3.5 2.9* 2.9* 2.3*    Assessment and Plan: 88 y.o. male with past medical history of anemia, prior skin cancer, cerebrovascular disease, PVD, chronic gastric ulcer, chronic kidney disease, COPD, dementia, depression, CHF, diverticulosis,  gastroesophageal reflux, prior stroke on Plavix ,hypertension who is under hospice services and presents now with abd pain/distension/diarrhea and findings suggesting acute cholecystitis ;  poor surgical candidate; s/p GB drain placement 12/15 (10 fr to bag); WBC nl, hgb 11(13.9), creat 1.71(1.8), t bili 0.2, bile cx - pend; blood cx neg to date; hydrate/antbx; cont with drain irrigation tid as IP, once daily as OP; monitor output closely, follow labs; drain will need to remain in place at least 4-6 weeks to allow for tract maturation unless cholecystectomy done in interim; f/u cholangiogram in 4-6 weeks or sooner if problems occur with drain; other plans as per [facility]CS/TRH   Electronically Signed: D. Rowe Miraj, PA-C 04/15/2022, 9:20 AM   I spent a total of 15 Minutes at the the patient's bedside AND on the patient's hospital floor or unit, greater than 50% of which was counseling/coordinating care for gallbladder drain    Patient ID: Kevin Lynch., male   DOB: 1933/03/24, 88 y.o.   MRN: [id number]

## 2022-04-16 DIAGNOSIS — K810 Acute cholecystitis: Secondary | ICD-10-CM | POA: Diagnosis not present

## 2022-04-16 LAB — BASIC METABOLIC PANEL
Anion gap: 5 (ref 5–15)
BUN: 28 mg/dL — ABNORMAL HIGH (ref 8–23)
CO2: 21 mmol/L — ABNORMAL LOW (ref 22–32)
Calcium: 7 mg/dL — ABNORMAL LOW (ref 8.9–10.3)
Chloride: 115 mmol/L — ABNORMAL HIGH (ref 98–111)
Creatinine, Ser: 1.39 mg/dL — ABNORMAL HIGH (ref 0.61–1.24)
GFR, Estimated: 49 mL/min — ABNORMAL LOW (ref >60)
Glucose, Bld: 73 mg/dL (ref 70–99)
Potassium: 4.1 mmol/L (ref 3.5–5.1)
Sodium: 141 mmol/L (ref 135–145)

## 2022-04-16 LAB — CBC
HCT: 35 % — ABNORMAL LOW (ref 39.0–52.0)
Hemoglobin: 10.6 g/dL — ABNORMAL LOW (ref 13.0–17.0)
MCH: 30.6 pg (ref 26.0–34.0)
MCHC: 30.3 g/dL (ref 30.0–36.0)
MCV: 101.2 fL — ABNORMAL HIGH (ref 80.0–100.0)
Platelets: 210 10*3/uL (ref 150–400)
RBC: 3.46 MIL/uL — ABNORMAL LOW (ref 4.22–5.81)
RDW: 12.8 % (ref 11.5–15.5)
WBC: 5.7 10*3/uL (ref 4.0–10.5)
nRBC: 0 % (ref 0.0–0.2)

## 2022-04-16 MED ORDER — ENOXAPARIN SODIUM 40 MG/0.4ML IJ SOSY
40.0000 mg | PREFILLED_SYRINGE | INTRAMUSCULAR | Status: DC
  Administered 2022-04-16 – 2022-04-17 (×2): 40 mg via SUBCUTANEOUS
  Filled 2022-04-16 (×2): qty 0.4

## 2022-04-16 NOTE — Progress Notes (Signed)
PROGRESS NOTE  Kevin Lynch.  GLO:[id number] DOB: Aug 25, 1932 DOA: 04/13/2022 PCP: Renaldo Reel, PA   Brief Narrative: Kevin Lynch. is a 88 y.o. male with medical history significant of advanced dementia, currently under hospice services, CVA, vascular dementia, hypertension, COPD, depression, diastolic CHF who presented from home with complaint of abdominal pain. On presentation, he was hemodynamically stable.  Lab work showed creatinine of 1.4.  WBC count of 15.6.  CT abdomen/pelvis and [place] RUQ suggestive of  acute cholecystitis.General surgery consulted.  Due to his advanced dementia, he was not a good surgical candidate as per surgery.  IR consulted and he underwent right upper quadrant drain.  Assessment & Plan:   Severe sepsis/septic shock: Became hypotensive, febrile with leukocytosis.  Started IV fluids, culture sent, no growth till date.  Sepsis physiology has improved.  Will discontinue IV fluids.  Briefly started on midodrine now discontinued as blood pressure is better.  Acute cholecystitis: Imaging showed cholelithiasis with gallbladder inflammation consistent with acute cholecystitis.  General surgery following.  Due to his advanced age and dementia, surgery not recommended.  Underwent right upper quadrant drain placement by IR.  Continue antibiotics.  Leukocytosis has resolved.  Started on clear liquid diet, checking with general surgery if he can advance diet.  CKD stage 3a: Baseline creatinine range of 1.6-1.7.Currently  kidney function near baseline    history of COPD: Currently not on exacerbation.  Continue home inhaler.  As needed bronchodilators.  Not on oxygen at home.  Currently on 2 L of oxygen for comfort   Hypertension: Takes metoprolol, ARB, spironolactone at home.  These are on hold, blood pressure improved.  Will restart these medications if he becomes hypertensive.   Diastolic CHF: Currently looks euvolemic.  Last echo showed EF of 18%, grade 1 diastolic  dysfunction   History of stroke/vascular dementia: Patient has dementia at baseline.  Remains confused.  Alert and oriented to place only.  Continue delirium precautions   BPH: On terazosin   History of depression: On Celexa   Goals of care: CODE STATUS DNR, confirmed with wife.  Patient is under hospice services at home. Elderly patient with dementia presented with acute cholecystitis.  Not a candidate surgery.  High risk for decompensation.  Palliative care consulted.Wife says that we can use vasopressors if it helps.         DVT prophylaxis:enoxaparin (LOVENOX) injection 40 mg Start: 04/16/22 2200     Code Status: DNR  Family Communication: Wife at bedside on 12/16  Patient status:Inpatient  Patient is from :Home  Anticipated discharge AC:ZYSA  Estimated DC date:1-2 days   Consultants: General surgery, IR  Procedures: Right upper quadrant drain  Antimicrobials:  Anti-infectives (From admission, onward)    Start     Dose/Rate Route Frequency Ordered Stop   04/13/22 2000  cefTRIAXone (ROCEPHIN) 2 g in sodium chloride 0.9 % 100 mL IVPB        2 g 200 mL/hr over 30 Minutes Intravenous Every 24 hours 04/13/22 1541     04/13/22 2000  metroNIDAZOLE (FLAGYL) IVPB 500 mg        500 mg 100 mL/hr over 60 Minutes Intravenous Every 8 hours 04/13/22 1541     04/13/22 1215  piperacillin-tazobactam (ZOSYN) IVPB 3.375 g        3.375 g 100 mL/hr over 30 Minutes Intravenous  Once 04/13/22 1213 04/13/22 1506       Subjective: Patient seen and examined at bedside today.  Hemodynamically stable.  Blood pressure is improved.  He looks more alert, awake and communicative.  He knows that he is in the hospital.  Not aware about date, month.  He denies any abdominal pain.  Objective: Vitals:   04/16/22 0600 04/16/22 0700 04/16/22 0800 04/16/22 0900  BP: (!) 138/58 (!) 145/73  (!) 147/84  Pulse: 92 81 68 75  Resp: '18 11 19 16  '$ Temp:  97.8 F (36.6 C)    TempSrc:  Axillary     SpO2: 93% 95% 96% 95%  Weight:      Height:        Intake/Output Summary (Last 24 hours) at 04/16/2022 1050 Last data filed at 04/16/2022 0900 Gross per 24 hour  Intake 5520.02 ml  Output 625 ml  Net 4895.02 ml   Filed Weights   04/13/22 1723 04/14/22 1140  Weight: 77.1 kg 73.9 kg    Examination:    General exam: Overall comfortable, not in distress HEENT: PERRL Respiratory system:  no wheezes or crackles  Cardiovascular system: S1 & S2 heard, RRR.  Gastrointestinal system: Abdomen is mildly distended, soft and nontender.  Right upper quadrant drain with brown fluid in the bag Central nervous system: Alert and awake, oriented to place Extremities: No edema, no clubbing ,no cyanosis Skin: No rashes, no ulcers,no icterus      Data Reviewed: I have personally reviewed following labs and imaging studies  CBC: Recent Labs  Lab 04/13/22 0936 04/14/22 0421 04/15/22 0239 04/16/22 0224  WBC 15.6* 15.9* 7.7 5.7  HGB 14.6 13.9 11.0* 10.6*  HCT 46.1 43.2 35.4* 35.0*  MCV 96.0 95.6 99.2 101.2*  PLT 264 290 203 932   Basic Metabolic Panel: Recent Labs  Lab 04/13/22 0936 04/14/22 0421 04/15/22 0239 04/16/22 0224  NA 136 137 141 141  K 4.1 5.4* 4.4 4.1  CL 102 103 109 115*  CO2 '23 24 24 '$ 21*  GLUCOSE 197* 129* 85 73  BUN 28* 32* 36* 28*  CREATININE 1.43* 1.80* 1.71* 1.39*  CALCIUM 9.0 8.5* 7.7* 7.0*     Recent Results (from the past 240 hour(s))  Culture, blood (Routine X 2) w Reflex to ID Panel     Status: None (Preliminary result)   Collection Time: 04/13/22  8:28 PM   Specimen: BLOOD  Result Value Ref Range Status   Specimen Description   Final    BLOOD BLOOD RIGHT HAND Performed at [facility], [place] [address] [place], [place] [address]    Special Requests   Final    BOTTLES DRAWN AEROBIC AND ANAEROBIC Blood Culture results may not be optimal due to an excessive volume of blood received in culture bottles Performed at [place] [address] [place], [place] [address]    Culture   Final    NO GROWTH 3 DAYS Performed at [facility], [place] [address], [place], [place] [address]    Report Status PENDING  Incomplete  Culture, blood (Routine X 2) w Reflex to ID Panel     Status: None (Preliminary result)   Collection Time: 04/13/22  8:28 PM   Specimen: BLOOD  Result Value Ref Range Status   Specimen Description   Final    BLOOD BLOOD LEFT HAND Performed at [place] [address] [place], [place] [address]    Special Requests   Final    BOTTLES DRAWN AEROBIC AND ANAEROBIC Blood Culture adequate volume Performed at [facility]  [address] [place], [place] [address]    Culture   Final    NO GROWTH 3 DAYS Performed at [facility], [place] [address] [place], [place] [address]    Report Status PENDING  Incomplete  MRSA Next Gen by PCR, Nasal     Status: None   Collection Time: 04/14/22 11:37 AM   Specimen: Nasal Mucosa; Nasal Swab  Result Value Ref Range Status   MRSA by PCR Next Gen NOT DETECTED NOT DETECTED Final    Comment: (NOTE) The GeneXpert MRSA Assay (FDA approved for NASAL specimens only), is one component of a comprehensive MRSA colonization surveillance program. It is not intended to diagnose MRSA infection nor to guide or monitor treatment for MRSA infections. Test performance is not FDA approved in patients less than 21 years old. Performed at [facility], [place] [address] [place], [place] [address]   Aerobic/Anaerobic Culture w Gram Stain (surgical/deep wound)     Status: None (Preliminary result)   Collection Time: 04/14/22 12:50 PM   Specimen: BILE  Result Value Ref Range Status   Specimen Description   Final    BILE Performed at [place] [address] [place], [place] [address]    Special Requests   Final    NONE Performed at [facility],  [place] [address] [place], [place] [address]    Gram Stain NO WBC SEEN NO ORGANISMS SEEN   Final   Culture   Final    NO GROWTH 2 DAYS Performed at [facility], [place] [address], [place],  [address]    Report Status PENDING  Incomplete     Radiology Studies: IR Perc Cholecystostomy  Result Date: 04/14/2022 INDICATION: 88 year old male presents for percutaneous cholecystostomy EXAM: CHOLECYSTOSTOMY MEDICATIONS: None. ANESTHESIA/SEDATION: Moderate (conscious) sedation was employed during this procedure. A total of Versed 0.5 mg and Fentanyl 50 mcg was administered intravenously. Moderate Sedation Time: 12 minutes. The patient's level of consciousness and vital signs were monitored continuously by radiology nursing throughout the procedure under my direct supervision. FLUOROSCOPY TIME:  Fluoroscopy Time: 0 minutes 30 seconds (3 mGy). COMPLICATIONS: None PROCEDURE: Informed written consent was obtained from the patient and the patient's family after a thorough discussion of the procedural risks, benefits and alternatives. All questions were addressed. Maximal Sterile Barrier Technique was utilized including caps, mask, sterile gowns, sterile gloves, sterile drape, hand hygiene and skin antiseptic. A timeout was performed prior to the initiation of the procedure. Ultrasound survey of the right upper quadrant was performed for planning purposes. Once the patient is prepped and draped in the usual sterile fashion, the skin and subcutaneous tissues overlying the gallbladder were generously infiltrated 1% lidocaine for local anesthesia. A coaxial needle was advanced under ultrasound guidance through the skin subcutaneous tissues and a small segment of liver into the gallbladder lumen. With removal of the stylet, spontaneous dark bile drainage occurred. Using modified Seldinger technique, a 10 French drain was placed into the gallbladder fossa, with aspiration of the sample for the lab. Contrast  injection confirmed position of the tube within the gallbladder lumen. Drainage catheter was attached to gravity drain with a suture retention placed. Patient tolerated the procedure well and remained hemodynamically stable throughout. No complications were encountered and no significant blood loss encountered. IMPRESSION: Status post image guided percutaneous cholecystostomy Signed, Dulcy Fanny. Nadene Rubins, RPVI [facility] Radiology Electronically Signed   By: Corrie Mckusick D.O.   On:  04/14/2022 13:57    Scheduled Meds:  Chlorhexidine Gluconate Cloth  6 each Topical Daily   enoxaparin (LOVENOX) injection  40 mg Subcutaneous Q24H   escitalopram  20 mg Oral Daily   fluticasone furoate-vilanterol  1 puff Inhalation Daily   And   umeclidinium bromide  1 puff Inhalation Daily   midodrine  5 mg Oral TID WC   polyethylene glycol  17 g Oral q AM   QUEtiapine  50 mg Oral BID   senna-docusate  1 tablet Oral QHS   senna-docusate  2 tablet Oral Daily   sodium chloride flush  5 mL Intracatheter Q8H   terazosin  5 mg Oral QHS   traZODone  25 mg Oral QHS   Continuous Infusions:  cefTRIAXone (ROCEPHIN)  IV Stopped (04/15/22 1956)   metronidazole Stopped (04/16/22 0559)     LOS: 3 days   Shelly Coss, MD [facility] P12/17/2023, 10:50 AM

## 2022-04-16 NOTE — Progress Notes (Signed)
Subjective/Chief Complaint: Complains of a little more abd pain today. No nausea   Objective: Vital signs in last 24 hours: Temp:  [97.7 F (36.5 C)-98.9 F (37.2 C)] 97.8 F (36.6 C) (12/17 0700) Pulse Rate:  [55-92] 92 (12/17 0600) Resp:  [8-18] 18 (12/17 0600) BP: (96-178)/(29-105) 138/58 (12/17 0600) SpO2:  [91 %-99 %] 93 % (12/17 0600) Last BM Date : 04/12/22  Intake/Output from previous day: 12/16 0701 - 12/17 0700 In: -  Out: 625 [Urine:600; Drains:25] Intake/Output this shift: No intake/output data recorded.  General appearance: alert and cooperative Resp: clear to auscultation bilaterally Cardio: regular rate and rhythm GI: soft, mild upper abd tenderness. Drain output bilious  Lab Results:  Recent Labs    04/15/22 0239 04/16/22 0224  WBC 7.7 5.7  HGB 11.0* 10.6*  HCT 35.4* 35.0*  PLT 203 210   BMET Recent Labs    04/15/22 0239 04/16/22 0224  NA 141 141  K 4.4 4.1  CL 109 115*  CO2 24 21*  GLUCOSE 85 73  BUN 36* 28*  CREATININE 1.71* 1.39*  CALCIUM 7.7* 7.0*   PT/INR Recent Labs    04/14/22 0918  LABPROT 16.6*  INR 1.4*   ABG No results for input(s): "PHART", "HCO3" in the last 72 hours.  Invalid input(s): "PCO2", "PO2"  Studies/Results: IR Perc Cholecystostomy  Result Date: 04/14/2022 INDICATION: 88 year old male presents for percutaneous cholecystostomy EXAM: CHOLECYSTOSTOMY MEDICATIONS: None. ANESTHESIA/SEDATION: Moderate (conscious) sedation was employed during this procedure. A total of Versed 0.5 mg and Fentanyl 50 mcg was administered intravenously. Moderate Sedation Time: 12 minutes. The patient's level of consciousness and vital signs were monitored continuously by radiology nursing throughout the procedure under my direct supervision. FLUOROSCOPY TIME:  Fluoroscopy Time: 0 minutes 30 seconds (3 mGy). COMPLICATIONS: None PROCEDURE: Informed written consent was obtained from the patient and the patient's family after a  thorough discussion of the procedural risks, benefits and alternatives. All questions were addressed. Maximal Sterile Barrier Technique was utilized including caps, mask, sterile gowns, sterile gloves, sterile drape, hand hygiene and skin antiseptic. A timeout was performed prior to the initiation of the procedure. Ultrasound survey of the right upper quadrant was performed for planning purposes. Once the patient is prepped and draped in the usual sterile fashion, the skin and subcutaneous tissues overlying the gallbladder were generously infiltrated 1% lidocaine for local anesthesia. A coaxial needle was advanced under ultrasound guidance through the skin subcutaneous tissues and a small segment of liver into the gallbladder lumen. With removal of the stylet, spontaneous dark bile drainage occurred. Using modified Seldinger technique, a 10 French drain was placed into the gallbladder fossa, with aspiration of the sample for the lab. Contrast injection confirmed position of the tube within the gallbladder lumen. Drainage catheter was attached to gravity drain with a suture retention placed. Patient tolerated the procedure well and remained hemodynamically stable throughout. No complications were encountered and no significant blood loss encountered. IMPRESSION: Status post image guided percutaneous cholecystostomy Signed, Dulcy Fanny. Nadene Rubins, RPVI [facility] Radiology Electronically Signed   By: Corrie Mckusick D.O.   On: 04/14/2022 13:57    Anti-infectives: Anti-infectives (From admission, onward)    Start     Dose/Rate Route Frequency Ordered Stop   04/13/22 2000  cefTRIAXone (ROCEPHIN) 2 g in sodium chloride 0.9 % 100 mL IVPB        2 g 200 mL/hr over 30 Minutes Intravenous Every 24 hours 04/13/22 1541  04/13/22 2000  metroNIDAZOLE (FLAGYL) IVPB 500 mg        500 mg 100 mL/hr over 60 Minutes Intravenous Every 8 hours 04/13/22 1541     04/13/22  1215  piperacillin-tazobactam (ZOSYN) IVPB 3.375 g        3.375 g 100 mL/hr over 30 Minutes Intravenous  Once 04/13/22 1213 04/13/22 1506       Assessment/Plan: s/p * No surgery found * Advance diet Continue abx and perc drain Acute cholecystitis  - RUQ [place] with cholelithiasis, gallbladder wall thickening and positive sonographic murphy sign  - WBC down after perc drain on abx. LFTs normal. Worsening AKI with sCr 1.8 - hypotensive and tachycardic this am. Recommend a percutaneous cholecystostomy for source control. I discussed this with his wife who is in agreement and have reached out to IR   FEN: NPO ID: [email or web address] in ED. Rocephin/flagyl VTE: lovenox, hold plavix   - below per TRH -  Hx of CVA on plavix - LD am 12/14 Hx of melanoma  COPD Chronic diastolic CHF HTN HLD CKD Peripheral vascular disease GERD Vascular dementia  Hx of gastric ulcer  BPH Anemia of chronic disease   LOS: 3 days    Autumn Messing III 04/16/2022

## 2022-04-17 ENCOUNTER — Other Ambulatory Visit (HOSPITAL_COMMUNITY): Payer: Self-pay

## 2022-04-17 DIAGNOSIS — K810 Acute cholecystitis: Secondary | ICD-10-CM | POA: Diagnosis not present

## 2022-04-17 LAB — BASIC METABOLIC PANEL
Anion gap: 4 — ABNORMAL LOW (ref 5–15)
BUN: 21 mg/dL (ref 8–23)
CO2: 21 mmol/L — ABNORMAL LOW (ref 22–32)
Calcium: 7.4 mg/dL — ABNORMAL LOW (ref 8.9–10.3)
Chloride: 116 mmol/L — ABNORMAL HIGH (ref 98–111)
Creatinine, Ser: 1.28 mg/dL — ABNORMAL HIGH (ref 0.61–1.24)
GFR, Estimated: 54 mL/min — ABNORMAL LOW (ref >60)
Glucose, Bld: 99 mg/dL (ref 70–99)
Potassium: 3.4 mmol/L — ABNORMAL LOW (ref 3.5–5.1)
Sodium: 141 mmol/L (ref 135–145)

## 2022-04-17 MED ORDER — SPIRONOLACTONE 25 MG PO TABS
25.0000 mg | ORAL_TABLET | Freq: Every evening | ORAL | Status: DC
  Administered 2022-04-17: 25 mg via ORAL
  Filled 2022-04-17: qty 1

## 2022-04-17 MED ORDER — LABETALOL HCL 5 MG/ML IV SOLN
10.0000 mg | INTRAVENOUS | Status: DC | PRN
  Administered 2022-04-17: 10 mg via INTRAVENOUS

## 2022-04-17 MED ORDER — METOPROLOL SUCCINATE ER 25 MG PO TB24
12.5000 mg | ORAL_TABLET | Freq: Every day | ORAL | Status: DC
  Administered 2022-04-17: 12.5 mg via ORAL
  Filled 2022-04-17: qty 1

## 2022-04-17 MED ORDER — IRBESARTAN 75 MG PO TABS
75.0000 mg | ORAL_TABLET | Freq: Every day | ORAL | Status: DC
  Administered 2022-04-17 – 2022-04-18 (×2): 75 mg via ORAL
  Filled 2022-04-17 (×2): qty 1

## 2022-04-17 MED ORDER — AMLODIPINE BESYLATE 10 MG PO TABS
10.0000 mg | ORAL_TABLET | Freq: Every day | ORAL | Status: DC
  Administered 2022-04-17 – 2022-04-18 (×2): 10 mg via ORAL
  Filled 2022-04-17 (×2): qty 1

## 2022-04-17 MED ORDER — METOPROLOL SUCCINATE ER 50 MG PO TB24
50.0000 mg | ORAL_TABLET | Freq: Every day | ORAL | Status: DC
  Administered 2022-04-18: 50 mg via ORAL
  Filled 2022-04-17: qty 1

## 2022-04-17 MED ORDER — POTASSIUM CHLORIDE CRYS ER 20 MEQ PO TBCR
40.0000 meq | EXTENDED_RELEASE_TABLET | Freq: Once | ORAL | Status: AC
  Administered 2022-04-17: 40 meq via ORAL
  Filled 2022-04-17: qty 2

## 2022-04-17 MED ORDER — LABETALOL HCL 5 MG/ML IV SOLN
10.0000 mg | Freq: Once | INTRAVENOUS | Status: AC
  Administered 2022-04-17: 10 mg via INTRAVENOUS
  Filled 2022-04-17: qty 4

## 2022-04-17 MED ORDER — SODIUM CHLORIDE 0.9% FLUSH
5.0000 mL | Freq: Every day | INTRAVENOUS | 0 refills | Status: AC
  Filled 2022-04-17: qty 300, 30d supply, fill #0

## 2022-04-17 NOTE — Progress Notes (Signed)
PROGRESS NOTE  Kevin Lynch.  ZOX:[id number] DOB: 1932-10-17 DOA: 04/13/2022 PCP: Renaldo Reel, PA   Brief Narrative: Kevin Lynch. is a 88 y.o. male with medical history significant of advanced dementia, currently under hospice services, CVA, vascular dementia, hypertension, COPD, depression, diastolic CHF who presented from home with complaint of abdominal pain. On presentation, he was hemodynamically stable.  Lab work showed creatinine of 1.4.  WBC count of 15.6.  CT abdomen/pelvis and [place] RUQ suggestive of  acute cholecystitis.General surgery consulted.  Due to his advanced dementia, he was not a good surgical candidate as per surgery.  IR consulted and he underwent right upper quadrant drain. Sepsis physiology has improved, he is hemodynamically stable.  Plan for discharge tomorrow to home with right upper quadrant.  Assessment & Plan:   Severe sepsis/septic shock: Became hypotensive, febrile with leukocytosis.  Started IV fluids, culture sent, no growth till date.  Sepsis physiology has improved. We d/ced IV fluids.  Acute cholecystitis: Imaging showed cholelithiasis with gallbladder inflammation consistent with acute cholecystitis.  General surgery were following.  Due to his advanced age and dementia, surgery not recommended.  Underwent right upper quadrant drain placement by IR.  Continue antibiotics.  Leukocytosis has resolved.  Diet advanced to soft  CKD stage 3a: Baseline creatinine range of 1.6-1.7.Currently  kidney function near baseline    history of COPD: Currently not on exacerbation.  Continue home inhaler.  As needed bronchodilators.  Not on oxygen at home.  Currently on 2 L of oxygen for comfort   Hypertension: Takes metoprolol, ARB, spironolactone at home. Resumed   Diastolic CHF: Currently looks euvolemic.  Last echo showed EF of 81-19%, grade 1 diastolic dysfunction   History of stroke/vascular dementia: Patient has dementia at baseline.  Remains confused.  Alert  and oriented to place only.  Continue delirium precautions   BPH: On terazosin   History of depression: On Celexa   Goals of care: CODE STATUS DNR, confirmed with wife.  Patient is under hospice services at home.          DVT prophylaxis:enoxaparin (LOVENOX) injection 40 mg Start: 04/16/22 2200     Code Status: DNR  Family Communication: Wife on phone on 12/18  Patient status:Inpatient  Patient is from :Home  Anticipated discharge JY:NWGN  Estimated DC date:tomorrow   Consultants: General surgery, IR  Procedures: Right upper quadrant drain  Antimicrobials:  Anti-infectives (From admission, onward)    Start     Dose/Rate Route Frequency Ordered Stop   04/13/22 2000  cefTRIAXone (ROCEPHIN) 2 g in sodium chloride 0.9 % 100 mL IVPB        2 g 200 mL/hr over 30 Minutes Intravenous Every 24 hours 04/13/22 1541     04/13/22 2000  metroNIDAZOLE (FLAGYL) IVPB 500 mg        500 mg 100 mL/hr over 60 Minutes Intravenous Every 8 hours 04/13/22 1541     04/13/22 1215  piperacillin-tazobactam (ZOSYN) IVPB 3.375 g        3.375 g 100 mL/hr over 30 Minutes Intravenous  Once 04/13/22 1213 04/13/22 1506       Subjective: Patient seen and examined at the bedside today.  Very comfortable and hemodynamically stable.  Blood pressure stable.  Denies any abdominal pain.  Had a bowel movement this morning  Objective: Vitals:   04/16/22 2206 04/17/22 0405 04/17/22 0748 04/17/22 0749  BP: (!) 172/84 (!) 166/94    Pulse: 60 65    Resp: 18  18    Temp: (!) 97.5 F (36.4 C) 98.2 F (36.8 C)    TempSrc: Oral Oral    SpO2: 97% 96% 96% 96%  Weight:      Height:        Intake/Output Summary (Last 24 hours) at 04/17/2022 1339 Last data filed at 04/17/2022 1018 Gross per 24 hour  Intake 223.42 ml  Output 1622 ml  Net -1398.58 ml   Filed Weights   04/13/22 1723 04/14/22 1140  Weight: 77.1 kg 73.9 kg    Examination:   General exam: Overall comfortable, not in  distress HEENT: PERRL Respiratory system:  no wheezes or crackles  Cardiovascular system: S1 & S2 heard, RRR.  Gastrointestinal system: Abdomen is soft, nontender, right upper quadrant drain , abdominal binder Central nervous system: Alert and awake ,oriented to self Extremities: No edema, no clubbing ,no cyanosis Skin: No rashes, no ulcers,no icterus     Data Reviewed: I have personally reviewed following labs and imaging studies  CBC: Recent Labs  Lab 04/13/22 0936 04/14/22 0421 04/15/22 0239 04/16/22 0224  WBC 15.6* 15.9* 7.7 5.7  HGB 14.6 13.9 11.0* 10.6*  HCT 46.1 43.2 35.4* 35.0*  MCV 96.0 95.6 99.2 101.2*  PLT 264 290 203 607   Basic Metabolic Panel: Recent Labs  Lab 04/13/22 0936 04/14/22 0421 04/15/22 0239 04/16/22 0224 04/17/22 0637  NA 136 137 141 141 141  K 4.1 5.4* 4.4 4.1 3.4*  CL 102 103 109 115* 116*  CO2 '23 24 24 '$ 21* 21*  GLUCOSE 197* 129* 85 73 99  BUN 28* 32* 36* 28* 21  CREATININE 1.43* 1.80* 1.71* 1.39* 1.28*  CALCIUM 9.0 8.5* 7.7* 7.0* 7.4*     Recent Results (from the past 240 hour(s))  Culture, blood (Routine X 2) w Reflex to ID Panel     Status: None (Preliminary result)   Collection Time: 04/13/22  8:28 PM   Specimen: BLOOD  Result Value Ref Range Status   Specimen Description   Final    BLOOD BLOOD RIGHT HAND Performed at [facility], [place] [address] [place], [place] [address]    Special Requests   Final    BOTTLES DRAWN AEROBIC AND ANAEROBIC Blood Culture results may not be optimal due to an excessive volume of blood received in culture bottles Performed at [facility] [address], [place], [place] [address]    Culture   Final    NO GROWTH 4 DAYS Performed at [facility], [place] [address], [place], [place] [address]    Report Status PENDING  Incomplete  Culture, blood (Routine X 2) w Reflex to ID Panel     Status: None (Preliminary result)   Collection Time: 04/13/22  8:28 PM    Specimen: BLOOD  Result Value Ref Range Status   Specimen Description   Final    BLOOD BLOOD LEFT HAND Performed at [facility] [address], [place], [place] [address]    Special Requests   Final    BOTTLES DRAWN AEROBIC AND ANAEROBIC Blood Culture adequate volume Performed at [facility] [address] [place], [place] [address]    Culture   Final    NO GROWTH 4 DAYS Performed at [facility], [place] [address], [place],  [address]    Report Status PENDING  Incomplete  MRSA Next Gen by PCR, Nasal     Status: None   Collection Time: 04/14/22 11:37 AM  Specimen: Nasal Mucosa; Nasal Swab  Result Value Ref Range Status   MRSA by PCR Next Gen NOT DETECTED NOT DETECTED Final    Comment: (NOTE) The GeneXpert MRSA Assay (FDA approved for NASAL specimens only), is one component of a comprehensive MRSA colonization surveillance program. It is not intended to diagnose MRSA infection nor to guide or monitor treatment for MRSA infections. Test performance is not FDA approved in patients less than 25 years old. Performed at [facility], [place] [address], [place], [place] [address]   Aerobic/Anaerobic Culture w Gram Stain (surgical/deep wound)     Status: None (Preliminary result)   Collection Time: 04/14/22 12:50 PM   Specimen: BILE  Result Value Ref Range Status   Specimen Description   Final    BILE Performed at [facility] [address] [place], [place] [address]    Special Requests   Final    NONE Performed at [facility], [place] [address] [place], [place] [address]    Gram Stain NO WBC SEEN NO ORGANISMS SEEN   Final   Culture   Final    NO GROWTH 3 DAYS NO ANAEROBES ISOLATED; CULTURE IN PROGRESS FOR 5 DAYS Performed at [facility] [address] [place] [address], [place],  [address]    Report Status PENDING  Incomplete     Radiology Studies: No results  found.  Scheduled Meds:  Chlorhexidine Gluconate Cloth  6 each Topical Daily   enoxaparin (LOVENOX) injection  40 mg Subcutaneous Q24H   escitalopram  20 mg Oral Daily   fluticasone furoate-vilanterol  1 puff Inhalation Daily   And   umeclidinium bromide  1 puff Inhalation Daily   irbesartan  75 mg Oral Daily   metoprolol succinate  12.5 mg Oral Q breakfast   polyethylene glycol  17 g Oral q AM   QUEtiapine  50 mg Oral BID   senna-docusate  1 tablet Oral QHS   senna-docusate  2 tablet Oral Daily   sodium chloride flush  5 mL Intracatheter Q8H   spironolactone  25 mg Oral QPM   terazosin  5 mg Oral QHS   traZODone  25 mg Oral QHS   Continuous Infusions:  cefTRIAXone (ROCEPHIN)  IV 2 g (04/16/22 2134)   metronidazole 500 mg (04/17/22 0534)     LOS: 4 days   Shelly Coss, MD [facility] P12/18/2023, 1:39 PM

## 2022-04-17 NOTE — Progress Notes (Signed)
   Subjective/Chief Complaint: Alert and conversant. Confused stating he is "lost". No abdominal pain or nausea    Objective: Vital signs in last 24 hours: Temp:  [97.5 F (36.4 C)-98.2 F (36.8 C)] 98.2 F (36.8 C) (12/18 0405) Pulse Rate:  [60-87] 65 (12/18 0405) Resp:  [11-27] 18 (12/18 0405) BP: (147-189)/(60-99) 166/94 (12/18 0405) SpO2:  [93 %-97 %] 96 % (12/18 0405) Last BM Date : 04/16/22  Intake/Output from previous day: 12/17 0701 - 12/18 0700 In: 5743.4 [P.O.:120; I.V.:4971.2; IV Piggyback:647.2] Out: 1622 [Urine:850; Drains:22; Stool:750] Intake/Output this shift: No intake/output data recorded.  General appearance: alert and cooperative Resp: respiratory effort nonlabored on room air Cardio: regular rate and rhythm GI: abdominal binder in place. soft, mild upper abd tenderness. Drain output bilious  Lab Results:  Recent Labs    04/15/22 0239 04/16/22 0224  WBC 7.7 5.7  HGB 11.0* 10.6*  HCT 35.4* 35.0*  PLT 203 210    BMET Recent Labs    04/15/22 0239 04/16/22 0224  NA 141 141  K 4.4 4.1  CL 109 115*  CO2 24 21*  GLUCOSE 85 73  BUN 36* 28*  CREATININE 1.71* 1.39*  CALCIUM 7.7* 7.0*    PT/INR Recent Labs    04/14/22 0918  LABPROT 16.6*  INR 1.4*    ABG No results for input(s): "PHART", "HCO3" in the last 72 hours.  Invalid input(s): "PCO2", "PO2"  Studies/Results: No results found.  Anti-infectives: Anti-infectives (From admission, onward)    Start     Dose/Rate Route Frequency Ordered Stop   04/13/22 2000  cefTRIAXone (ROCEPHIN) 2 g in sodium chloride 0.9 % 100 mL IVPB        2 g 200 mL/hr over 30 Minutes Intravenous Every 24 hours 04/13/22 1541     04/13/22 2000  metroNIDAZOLE (FLAGYL) IVPB 500 mg        500 mg 100 mL/hr over 60 Minutes Intravenous Every 8 hours 04/13/22 1541     04/13/22 1215  piperacillin-tazobactam (ZOSYN) IVPB 3.375 g        3.375 g 100 mL/hr over 30 Minutes Intravenous  Once 04/13/22 1213 04/13/22  1506       Assessment/Plan: Acute cholecystitis  - RUQ [place] with cholelithiasis, gallbladder wall thickening and positive sonographic murphy sign  - WBC down after perc drain on abx. - Continue abx and perc drain - advance diet to soft - can resume plavix - doing well s/p perc chole. Follow up with IR. Will arrange follow up with Dr. Donne Hazel   FEN: soft ID: zosyn in ED. Rocephin/flagyl VTE: lovenox, hold plavix   - below per TRH -  Hx of CVA on plavix - LD am 12/14 Hx of melanoma  COPD Chronic diastolic CHF HTN HLD CKD Peripheral vascular disease GERD Vascular dementia  Hx of gastric ulcer  BPH Anemia of chronic disease   LOS: 4 days   Winferd Humphrey, [facility] Surgery 04/17/2022, 9:04 AM Please see Amion for pager number during day hours 7:00am-4:30pm

## 2022-04-17 NOTE — TOC Initial Note (Signed)
Transition of Care (TOC) - Initial/Assessment Note    Patient Details  Name: Kevin Lynch. MRN: [id number] Date of Birth: 1933-02-14  Transition of Care [facility]) CM/SW Contact:    Vassie Moselle, LCSW Phone Number: 04/17/2022, 12:39 PM  Clinical Narrative:                 CSW spoke with pt's spouse and confirmed pt resides at home with her and is active with [facility]. Pt's wife would like pt to return home at discharge and continue with hospice services. No DME needs identified.   Expected Discharge Plan: Home w Hospice Care Barriers to Discharge: Continued Medical Work up   Patient Goals and CMS Choice Patient states their goals for this hospitalization and ongoing recovery are:: For pt to return home w/ hospice CMS Medicare.gov Compare Post Acute Care list provided to:: Patient Represenative (must comment) (Spouse) Choice offered to / list presented to : Spouse  Expected Discharge Plan and Services Expected Discharge Plan: Home w Hospice Care In-house Referral: NA Discharge Planning Services: NA Post Acute Care Choice: Hospice, Resumption of Svcs/PTA Provider Living arrangements for the past 2 months: Single Family Home                 DME Arranged: N/A DME Agency: NA                  Prior Living Arrangements/Services Living arrangements for the past 2 months: Single Family Home Lives with:: Spouse   Do you feel safe going back to the place where you live?: Yes      Need for Family Participation in Patient Care: Yes (Comment) Care giver support system in place?: Yes (comment) Current home services: Hospice Criminal Activity/Legal Involvement Pertinent to Current Situation/Hospitalization: No - Comment as needed  Activities of Daily Living Home Assistive Devices/Equipment: Environmental consultant (specify type), Wheelchair, Grab bars around toilet, Shower chair with back (3 wheel and 2 wheel) ADL Screening (condition at time of admission) Patient's  cognitive ability adequate to safely complete daily activities?: No Is the patient deaf or have difficulty hearing?: No Does the patient have difficulty seeing, even when wearing glasses/contacts?: Yes Does the patient have difficulty concentrating, remembering, or making decisions?: Yes Patient able to express need for assistance with ADLs?: Yes Does the patient have difficulty dressing or bathing?: Yes Independently performs ADLs?: No Communication: Needs assistance Is this a change from baseline?: Pre-admission baseline Dressing (OT): Dependent Is this a change from baseline?: Pre-admission baseline Grooming: Needs assistance Is this a change from baseline?: Pre-admission baseline Feeding: Needs assistance Is this a change from baseline?: Pre-admission baseline Bathing: Needs assistance Is this a change from baseline?: Pre-admission baseline Toileting: Dependent Is this a change from baseline?: Pre-admission baseline In/Out Bed: Needs assistance Is this a change from baseline?: Pre-admission baseline Walks in Home: Dependent Is this a change from baseline?: Pre-admission baseline Does the patient have difficulty walking or climbing stairs?: Yes Weakness of Legs: Both Weakness of Arms/Hands: Both  Permission Sought/Granted Permission sought to share information with : Family Supports                Emotional Assessment   Attitude/Demeanor/Rapport: Unable to Assess Affect (typically observed): Unable to Assess Orientation: : Oriented to Self Alcohol / Substance Use: Not Applicable Psych Involvement: No (comment)  Admission diagnosis:  Acute cholecystitis [K81.0] Cholecystitis [K81.9] Severe sepsis ([place]) [A41.9, R65.20] Patient Active Problem List   Diagnosis Date Noted   Severe sepsis (  [place]) 04/14/2022   Palliative care by specialist 04/14/2022   Goals of care, counseling/discussion 04/14/2022   General weakness 04/14/2022   Acute cholecystitis 04/13/2022    Bradycardia 01/15/2020   Wears glasses    Restless leg syndrome    Pre-diabetes    Prediabetes    Hypertension    Hyperlipidemia    Hyperglycemia    History of stroke    History of blood transfusion    HDL lipoprotein deficiency    Gastric ulcer    Former smoker    Diverticulitis    Diastolic heart failure ([place])    Depression    Dementia ([place])    Cataracts, bilateral    Arthritis    Anemia    Actinic keratosis    Mohs defect 12/01/2019   Basal cell carcinoma 11/10/2019   Open wound of face 11/10/2019   Melanoma ([place]) 08/19/2019   CKD (chronic kidney disease) 02/01/2018   Enlarged thoracic aorta ([place]) 01/31/2018   Tick bite 01/07/2018   Vitamin D deficiency 01/07/2018   Chronic diarrhea 01/07/2018   Dyspnea 58/85/0277   Diastolic CHF ([place]) 41/28/7867   Physical deconditioning 07/04/2017   Simple chronic bronchitis ([place]) 07/04/2017   Chronic gastric ulcer 06/10/2015   Gastroesophageal reflux disease with esophagitis 06/10/2015   Ataxic gait 06/09/2015   BPH (benign prostatic hyperplasia) 06/09/2015   Cerebral vascular disease 06/09/2015   Claudication, intermittent ([place]) 06/09/2015   COPD (chronic obstructive pulmonary disease) ([place]) 06/09/2015   Hypertensive heart disease 06/09/2015   Lower extremity edema 06/09/2015   Male erectile dysfunction, unspecified 06/09/2015   Recurrent major depressive disorder, in full remission ([place]) 06/09/2015   Reduced libido 06/09/2015   Vascular dementia ([place]) 06/09/2015   Vitamin B 12 deficiency 06/09/2015   Stroke ([place]) 2012   PCP:  Renaldo Reel, PA Pharmacy:   [facility] [address] [place] [address] Phone: [phone] Fax: [phone]     Social Determinants of Health (SDOH) Interventions Housing Interventions: Intervention Not Indicated  Readmission Risk Interventions    04/17/2022   12:34 PM  Readmission Risk Prevention Plan  Transportation  Screening Complete  PCP or Specialist Appt within 3-5 Days Complete  HRI or [place] Complete  Social Work Consult for [place] Planning/Counseling Complete  Palliative Care Screening Complete  Medication Review Press photographer) Complete

## 2022-04-18 ENCOUNTER — Other Ambulatory Visit: Payer: Self-pay | Admitting: Student

## 2022-04-18 ENCOUNTER — Other Ambulatory Visit (HOSPITAL_COMMUNITY): Payer: Self-pay

## 2022-04-18 DIAGNOSIS — K810 Acute cholecystitis: Secondary | ICD-10-CM | POA: Diagnosis not present

## 2022-04-18 LAB — BASIC METABOLIC PANEL
Anion gap: 6 (ref 5–15)
BUN: 16 mg/dL (ref 8–23)
CO2: 19 mmol/L — ABNORMAL LOW (ref 22–32)
Calcium: 7.5 mg/dL — ABNORMAL LOW (ref 8.9–10.3)
Chloride: 115 mmol/L — ABNORMAL HIGH (ref 98–111)
Creatinine, Ser: 1.16 mg/dL (ref 0.61–1.24)
GFR, Estimated: >60 mL/min (ref >60)
Glucose, Bld: 98 mg/dL (ref 70–99)
Potassium: 3.7 mmol/L (ref 3.5–5.1)
Sodium: 140 mmol/L (ref 135–145)

## 2022-04-18 LAB — CULTURE, BLOOD (ROUTINE X 2)
Culture: NO GROWTH
Culture: NO GROWTH
Special Requests: ADEQUATE

## 2022-04-18 MED ORDER — METOPROLOL SUCCINATE ER 50 MG PO TB24
50.0000 mg | ORAL_TABLET | Freq: Every day | ORAL | 0 refills | Status: AC
  Filled 2022-04-18: qty 30, 30d supply, fill #0

## 2022-04-18 MED ORDER — AMOXICILLIN-POT CLAVULANATE 875-125 MG PO TABS
1.0000 | ORAL_TABLET | Freq: Two times a day (BID) | ORAL | 0 refills | Status: AC
  Filled 2022-04-18: qty 10, 5d supply, fill #0

## 2022-04-18 MED ORDER — AMOXICILLIN-POT CLAVULANATE 875-125 MG PO TABS: 1.0000 | ORAL_TABLET | Freq: Two times a day (BID) | ORAL | Status: DC

## 2022-04-18 MED ORDER — AMLODIPINE BESYLATE 10 MG PO TABS
10.0000 mg | ORAL_TABLET | Freq: Every day | ORAL | 0 refills | Status: AC
  Filled 2022-04-18: qty 30, 30d supply, fill #0

## 2022-04-18 NOTE — Progress Notes (Signed)
Physical Therapy Treatment Patient Details Name: Kevin Lynch. MRN: [id number] DOB: [birth year] Today's Date: 04/18/2022   History of Present Illness Yousif Edelson. is a 88 y.o. male with medical history significant of advanced dementia, currently under hospice services, CVA, vascular dementia, hypertension, COPD, depression, diastolic CHF who presented from home with complaint of abdominal pain.   Lab work showed creatinine of 1.4.  WBC count of 15.6.  CT abdomen/pelvis and [place] RUQ suggestive of  acute cholecystitis when he arrived in ED .R upper quadrant drain was placed by IR urology.    PT Comments    Assisted family with stand pivot tranfers in room and also with car transfer. See comments in that section.    Recommendations for follow up therapy are one component of a multi-disciplinary discharge planning process, led by the attending physician.  Recommendations may be updated based on patient status, additional functional criteria and insurance authorization.  Follow Up Recommendations  Home health PT (however pt wiht hospice so family may be fine with home situation.)     Assistance Recommended at Discharge Frequent or constant Supervision/Assistance  Patient can return home with the following Two people to help with walking and/or transfers;Two people to help with bathing/dressing/bathroom;Assistance with cooking/housework;Assistance with feeding;Assist for transportation   Equipment Recommendations  None recommended by PT    Recommendations for Other Services       Precautions / Restrictions       Mobility  Bed Mobility Overal bed mobility: Needs Assistance Bed Mobility: Supine to Sit, Sit to Supine     Supine to sit: Max assist, +2 for physical assistance Sit to supine: Max assist, +2 for physical assistance   General bed mobility comments: rigid with morment patterns, had to move slow and make sur he was on board or else he resists and makes it mre difficult.  Static sitting ModA leaning to the Left and unable to self correct but was aware he was leaning.    Transfers Overall transfer level: Needs assistance Equipment used: Rolling walker (2 wheels) Transfers: Sit to/from Stand Sit to Stand: +2 physical assistance, Max assist           General transfer comment: Sit to stand pivot from bed to transport WC with family present 2 person assist. then stand pivot without RW WC to car, 2 person assit total, Challengign due to pt stiff, and very little hip flexion wiht sitting, and once sitting i front seat extensds his hips and slides forward, required 3 person assit to get him sitting more upright in car. Spoke with wife who was present and eeducated with tips for trasnfer out of car when home, pag present under patient to assist, and she called another person to help as well.    Ambulation/Gait Ambulation/Gait assistance:  (unable , and wife states he does not walk at home, just stand pivots)                 Marine scientist [place] (Stroke Patients Only)       Balance Overall balance assessment: Needs assistance Sitting-balance support: Feet supported, Bilateral upper extremity supported Sitting balance-Leahy Scale: Poor     Standing balance support: Bilateral upper extremity supported, During functional activity, Reliant on assistive device for balance Standing balance-Leahy Scale: Poor  Cognition Arousal/Alertness: Awake/alert (however does not like to keep his eyes open. Will open on command) Behavior During Therapy: WFL for tasks assessed/performed (a little relunctant to participate, however did cooperate and participate with [place]) Overall Cognitive Status: No family/caregiver present to determine baseline cognitive functioning                                          Exercises      General Comments        Pertinent  Vitals/Pain Pain Assessment Pain Assessment: Faces Faces Pain Scale: Hurts little more Pain Location: " my butt"    Home Living Family/patient expects to be discharged to:: Private residence Living Arrangements: Spouse/significant other Available Help at Discharge: Family Type of Home: House Home Access: Ramped entrance;Level entry         Home Equipment: Scientist, research (medical) (2 wheels);BSC/3in1      Prior Function            PT Goals (current goals can now be found in the care plan section) Acute Rehab PT Goals PT Goal Formulation: Patient unable to participate in goal setting Time For Goal Achievement: 04/25/22 Potential to Achieve Goals: Fair    Frequency    Min 3X/week      PT Plan      Co-evaluation              AM-PAC PT "6 Clicks" Mobility   Outcome Measure  Help needed turning from your back to your side while in a flat bed without using bedrails?: Total Help needed moving from lying on your back to sitting on the side of a flat bed without using bedrails?: Total Help needed moving to and from a bed to a chair (including a wheelchair)?: Total Help needed standing up from a chair using your arms (e.g., wheelchair or bedside chair)?: Total Help needed to walk in hospital room?: Total Help needed climbing 3-5 steps with a railing? : Total 6 Click Score: 6    End of Session   Activity Tolerance: Patient tolerated treatment well Patient left: in bed;with bed alarm set;with family/visitor present Nurse Communication: Mobility status PT Visit Diagnosis: Muscle weakness (generalized) (M62.81)     Time: 2836-6294 PT Time Calculation (min) (ACUTE ONLY): 20 min  Charges:  $Therapeutic Activity: 8-22 mins                     Gatha Mayer, PT, MPT Acute Rehabilitation Services Office: [phone] If a weekend: [facility] w/e pager [phone] 04/18/2022    Clide Dales 04/18/2022, 4:00 PM

## 2022-04-18 NOTE — Evaluation (Signed)
Physical Therapy Evaluation Patient Details Name: Kevin Lynch. MRN: [id number] DOB: 09/05/1932 Today's Date: 04/18/2022  History of Present Illness  Kevin Lynch. is a 88 y.o. male with medical history significant of advanced dementia, currently under hospice services, CVA, vascular dementia, hypertension, COPD, depression, diastolic CHF who presented from home with complaint of abdominal pain.   Lab work showed creatinine of 1.4.  WBC count of 15.6.  CT abdomen/pelvis and [place] RUQ suggestive of  acute cholecystitis when he arrived in ED .R upper quadrant drain was placed by IR urology.  Clinical Impression   Pt was able to become alert during my session and interact with appropriate answer with 2-3 words. Pt was very rigid with movement, and became more resistant if you didn't move slow and make sure he was comfortable with you helping him. We were able to get him to the [place] of the bed, and attempted sit to stand 2 times, unable to make real steps lateral tot he head of bed but attempted before he really wanted to lay back down. He perseverates on " I'm cold" and wife stated he does this  at home. Spoke with wife address how difficult the car transfer may be today for DC home, advocating for another method, however wife insisted her and her son could manage him.  I will work with the car transfer her at the hospital and give any tips to help them be successful as possible at home.        Recommendations for follow up therapy are one component of a multi-disciplinary discharge planning process, led by the attending physician.  Recommendations may be updated based on patient status, additional functional criteria and insurance authorization.  Follow Up Recommendations Home health PT (however pt wiht hospice so family may be fine with home situation.)      Assistance Recommended at Discharge Frequent or constant Supervision/Assistance  Patient can return home with the following  Two people  to help with walking and/or transfers;Two people to help with bathing/dressing/bathroom;Assistance with cooking/housework;Assistance with feeding;Assist for transportation    Equipment Recommendations None recommended by PT  Recommendations for Other Services       Functional Status Assessment Patient has had a recent decline in their functional status and demonstrates the ability to make significant improvements in function in a reasonable and predictable amount of time.     Precautions / Restrictions        Mobility  Bed Mobility Overal bed mobility: Needs Assistance Bed Mobility: Supine to Sit, Sit to Supine     Supine to sit: Max assist, +2 for physical assistance Sit to supine: Max assist, +2 for physical assistance   General bed mobility comments: rigid with morment patterns, had to move slow and make sur he was on board or else he resists and makes it mre difficult. Static sitting ModA leaning to the Left and unable to self correct but was aware he was leaning.    Transfers Overall transfer level: Needs assistance Equipment used: Rolling walker (2 wheels) Transfers: Sit to/from Stand Sit to Stand: Mod assist, +2 physical assistance           General transfer comment: sit to stand at [facility] with maz cues and tactile and placing his hands on RW and modA x2 for boost of hips. Never fully extended B LEs . Attempted 2 times and attempted a few steps along the bed side stepping to head of bed and only 2 very small steps .  Ambulation/Gait Ambulation/Gait assistance:  (unable , and wife states he does not walk at home, just stand pivots)                Financial trader Rankin (Stroke Patients Only)       Balance Overall balance assessment: Needs assistance Sitting-balance support: Feet supported, Bilateral upper extremity supported Sitting balance-Leahy Scale: Poor     Standing balance support: Bilateral upper  extremity supported, During functional activity, Reliant on assistive device for balance Standing balance-Leahy Scale: Poor                               Pertinent Vitals/Pain Pain Assessment Pain Assessment: Faces Faces Pain Scale: Hurts little more Pain Location: " my butt"    Home Living Family/patient expects to be discharged to:: Private residence Living Arrangements: Spouse/significant other Available Help at Discharge: Family Type of Home: House Home Access: Ramped entrance;Level entry         Home Equipment: Scientist, research (medical) (2 wheels);BSC/3in1      Prior Function               Mobility Comments: Pt unable to give PLOF , I called wife and she stated her and her son assit with stand pivot transfers to [facility] or bed, or car. Pt also has hospice coming into the home.       Hand Dominance        Extremity/Trunk Assessment        Lower Extremity Assessment Lower Extremity Assessment:  (difficult to asses due to his demtia , howeer with functional movment seems to be at least 3+/5 for knee extension, rigid movements, and some noted knee flexion contracture aroun 10-15 degrees, and flexed posture at trunk)       Communication   Communication: Other (comment) (pt with dementia however goes respond with 2-4 word sentences and appropriate usually, does perserverate on " cold")  Cognition Arousal/Alertness: Awake/alert (however does not like to keep his eyes open. Will open on command) Behavior During Therapy: WFL for tasks assessed/performed (a little relunctant to participate, however did cooperate and participate with [place]) Overall Cognitive Status: No family/caregiver present to determine baseline cognitive functioning                                          General Comments      Exercises     Assessment/Plan    PT Assessment Patient needs continued PT services  PT Problem List Decreased activity  tolerance;Decreased strength;Decreased range of motion;Decreased mobility       PT Treatment Interventions Functional mobility training;Therapeutic activities;Therapeutic exercise;Patient/family education    PT Goals (Current goals can be found in the Care Plan section)  Acute Rehab PT Goals PT Goal Formulation: Patient unable to participate in goal setting Time For Goal Achievement: 04/25/22 Potential to Achieve Goals: Fair    Frequency Min 3X/week     Co-evaluation               AM-PAC PT "6 Clicks" Mobility  Outcome Measure Help needed turning from your back to your side while in a flat bed without using bedrails?: Total Help needed moving from lying on your back to sitting on the side of a flat  bed without using bedrails?: Total Help needed moving to and from a bed to a chair (including a wheelchair)?: Total Help needed standing up from a chair using your arms (e.g., wheelchair or bedside chair)?: Total Help needed to walk in hospital room?: Total Help needed climbing 3-5 steps with a railing? : Total 6 Click Score: 6    End of Session   Activity Tolerance: Patient tolerated treatment well Patient left: in bed;with bed alarm set;with family/visitor present Nurse Communication: Mobility status PT Visit Diagnosis: Muscle weakness (generalized) (M62.81)    Time: 0721-8288 PT Time Calculation (min) (ACUTE ONLY): 27 min   Charges:   PT Evaluation $PT Eval Low Complexity: 1 Low          Magdelene Ruark, PT, MPT Acute Rehabilitation Services Office: [phone] If a weekend: [facility] w/e pager [phone] 04/18/2022   Clide Dales 04/18/2022, 2:08 PM

## 2022-04-18 NOTE — Discharge Summary (Signed)
Physician Discharge Summary  Kevin Lynch. DZH:[id number] DOB: [birth year] DOA: 04/13/2022  PCP: Renaldo Reel, PA  Admit date: 04/13/2022 Discharge date: 04/18/2022  Admitted From: Home Disposition:  Home  Discharge Condition:Stable CODE STATUS: DNR Diet recommendation: Heart Healthy   Brief/Interim Summary: Kevin Tiberio. is a 88 y.o. male with medical history significant of advanced dementia, currently under hospice services, CVA, vascular dementia, hypertension, COPD, depression, diastolic CHF who presented from home with complaint of abdominal pain. On presentation, he was hemodynamically stable.  Lab work showed creatinine of 1.4.  WBC count of 15.6.  CT abdomen/pelvis and [place] RUQ suggestive of  acute cholecystitis.General surgery consulted.  Due to his advanced dementia, he was not a good surgical candidate as per surgery.  IR consulted and he underwent right upper quadrant drain. Sepsis physiology has improved, he is hemodynamically stable.  Plan for discharge today  to home with right upper quadrant drain.  He will be followed by general surgery and IR as an outpatient.  Following problems were addressed during her  hospitalization:  Severe sepsis/septic shock: Became hypotensive, febrile with leukocytosis.  Started IV fluids, culture sent, no growth till date.  Sepsis physiology has improved.    Acute cholecystitis: Imaging showed cholelithiasis with gallbladder inflammation consistent with acute cholecystitis.  General surgery were following.  Due to his advanced age and dementia, surgery not recommended.  Underwent right upper quadrant drain placement by IR.  Continue antibiotics on dc to complete 10 days course.  Leukocytosis has resolved.  Diet advanced    CKD stage 3a: Baseline creatinine range of 1.6-1.7.Currently  kidney function better than baseline   history of COPD: Currently not on exacerbation.  Continue home inhaler.  As needed bronchodilators.  Not on oxygen at  home.     Hypertension: Takes metoprolol, ARB, spironolactone at home. Resumed with increased dose, added amlodipine.  He remained hypertensive during this hospitalization.  Needs to monitor his blood pressure at home   Diastolic CHF: Currently looks euvolemic.  Last echo showed EF of 41-96%, grade 1 diastolic dysfunction   History of stroke/vascular dementia: Patient has dementia at baseline.  Remains confused.  Alert and oriented to place only.  Continue delirium precautions   BPH: On terazosin   History of depression: On Celexa   Goals of care: CODE STATUS DNR, confirmed with wife.  Patient is under hospice services at home.      Discharge Diagnoses:  Principal Problem:   Acute cholecystitis Active Problems:   Diastolic CHF ([place])   COPD (chronic obstructive pulmonary disease) ([place])   Vascular dementia ([place])   Hypertension   Severe sepsis ([place])   Palliative care by specialist   Goals of care, counseling/discussion   General weakness    Discharge Instructions  Discharge Instructions     Diet - low sodium heart healthy   Complete by: As directed    Discharge instructions   Complete by: As directed    1)Please take prescribed medication as instructed 2)Follow up with general surgery as an outpatient.  Name and number of the provider has been attached 3)Follow up with her PCP/hospice services. 4)You will be called by interventional radiology for the follow-up of the drain care   Increase activity slowly   Complete by: As directed    No wound care   Complete by: As directed       Allergies as of 04/18/2022       Reactions   Shellfish Allergy Hives, Shortness Of  Breath   Codeine Nausea Only   Lisinopril Other (See Comments), Cough   Memory difficulty also        Medication List     STOP taking these medications    donepezil 10 MG tablet Commonly known as: ARICEPT   memantine 10 MG tablet Commonly known as: NAMENDA       TAKE these medications     albuterol (2.5 MG/3ML) 0.083% nebulizer solution Commonly known as: PROVENTIL Take 2.5 mg by nebulization every 6 (six) hours as needed for wheezing or shortness of breath.   amLODipine 10 MG tablet Commonly known as: NORVASC Take 1 tablet (10 mg total) by mouth daily. Start taking on: April 19, 2022   amoxicillin-clavulanate 875-125 MG tablet Commonly known as: AUGMENTIN Take 1 tablet by mouth every 12 (twelve) hours for 5 days.   bisacodyl 10 MG suppository Commonly known as: DULCOLAX Place 10 mg rectally as needed for mild constipation or moderate constipation.   Clear Eyes for Dry Eyes 1-0.25 % Soln Generic drug: Carboxymethylcellul-Glycerin Place 1 drop into both eyes 4 (four) times daily as needed (for dryness).   clopidogrel 75 MG tablet Commonly known as: PLAVIX Take 37.5 mg by mouth every other day.   Colace 100 MG capsule Generic drug: docusate sodium Take 200 mg by mouth in the morning and at bedtime.   escitalopram 20 MG tablet Commonly known as: LEXAPRO Take 20 mg by mouth at bedtime.   gabapentin 100 MG capsule Commonly known as: NEURONTIN Take 100 mg by mouth at bedtime.   ibuprofen 200 MG tablet Commonly known as: ADVIL Take 400 mg by mouth in the morning.   loperamide 2 MG tablet Commonly known as: IMODIUM A-D Take 2 mg by mouth 4 (four) times daily as needed for diarrhea or loose stools.   LORazepam 0.5 MG tablet Commonly known as: ATIVAN Take 0.5 mg by mouth See admin instructions. Take 0.5 mg by mouth at bedtime and an additional 0.5 mg every 4 hours as needed for agitation or anxiousness   metoprolol succinate 50 MG 24 hr tablet Commonly known as: TOPROL-XL Take 1 tablet (50 mg total) by mouth daily with breakfast. Take with or immediately following a meal. Start taking on: April 19, 2022 What changed:  medication strength how much to take when to take this additional instructions   ondansetron 4 MG tablet Commonly known as:  ZOFRAN Take 4 mg by mouth in the morning and at bedtime.   Pepto-Bismol 262 MG/15ML suspension Generic drug: bismuth subsalicylate Take 30 mLs by mouth 2 (two) times daily as needed for indigestion or diarrhea or loose stools.   polyethylene glycol powder 17 GM/SCOOP powder Commonly known as: GLYCOLAX/MIRALAX Take 17 g by mouth in the morning.   Preparation H 0.25-14-74.9 % rectal ointment Generic drug: phenylephrine-shark liver oil-mineral oil-petrolatum Place rectally 3 (three) times daily as needed for hemorrhoids.   Sarna Sensitive 1 % Lotn Generic drug: pramoxine Apply 1 Application topically See admin instructions. Apply to the back after each shower   sennosides-docusate sodium 8.6-50 MG tablet Commonly known as: SENOKOT-S Take 1-2 tablets by mouth See admin instructions. Take 2 tablets by mouth in the morning and 1 tablet at bedtime   SEROquel 50 MG tablet Generic drug: QUEtiapine Take 50 mg by mouth in the morning and at bedtime. What changed: Another medication with the same name was removed. Continue taking this medication, and follow the directions you see here.   sodium chloride flush 0.9 % Soln  Commonly known as: NS Flush catheter with 5 mLs daily as directed.   spironolactone 25 MG tablet Commonly known as: ALDACTONE Take 25 mg by mouth every evening.   terazosin 5 MG capsule Commonly known as: HYTRIN Take 5 mg by mouth at bedtime.   traZODone 50 MG tablet Commonly known as: DESYREL Take 25 mg by mouth at bedtime.   Trelegy Ellipta 100-62.5-25 MCG/ACT Aepb Generic drug: Fluticasone-Umeclidin-Vilant Inhale 1 puff into the lungs See admin instructions. Inhale 1 puff into the lungs every 4 days   valsartan 80 MG tablet Commonly known as: DIOVAN Take 40 mg by mouth in the morning.   Vitamin D3 1000 units Caps Take 1,000 Units by mouth in the morning and at bedtime.        Follow-up Information     Rolm Bookbinder, MD. Call.   Specialty:  General Surgery Why: We are making a follow up appointment for you., Please call to confirm appointment time., Arrive 30 minutes early to complete check in, and bring photo ID and insurance card. Contact information: [address] [place] [address] [phone]         Renaldo Reel, [place]. Schedule an appointment as soon as possible for a visit in 1 week(s).   Specialty: Family Medicine Contact information: [place] [address] [phone]                Allergies  Allergen Reactions   Shellfish Allergy Hives and Shortness Of Breath   Codeine Nausea Only   Lisinopril Other (See Comments) and Cough    Memory difficulty also    Consultations: IR, general surgery   Procedures/Studies: IR Perc Cholecystostomy  Result Date: 04/14/2022 INDICATION: 88 year old male presents for percutaneous cholecystostomy EXAM: CHOLECYSTOSTOMY MEDICATIONS: None. ANESTHESIA/SEDATION: Moderate (conscious) sedation was employed during this procedure. A total of Versed 0.5 mg and Fentanyl 50 mcg was administered intravenously. Moderate Sedation Time: 12 minutes. The patient's level of consciousness and vital signs were monitored continuously by radiology nursing throughout the procedure under my direct supervision. FLUOROSCOPY TIME:  Fluoroscopy Time: 0 minutes 30 seconds (3 mGy). COMPLICATIONS: None PROCEDURE: Informed written consent was obtained from the patient and the patient's family after a thorough discussion of the procedural risks, benefits and alternatives. All questions were addressed. Maximal Sterile Barrier Technique was utilized including caps, mask, sterile gowns, sterile gloves, sterile drape, hand hygiene and skin antiseptic. A timeout was performed prior to the initiation of the procedure. Ultrasound survey of the right upper quadrant was performed for planning purposes. Once the patient is prepped and draped in the usual sterile fashion,  the skin and subcutaneous tissues overlying the gallbladder were generously infiltrated 1% lidocaine for local anesthesia. A coaxial needle was advanced under ultrasound guidance through the skin subcutaneous tissues and a small segment of liver into the gallbladder lumen. With removal of the stylet, spontaneous dark bile drainage occurred. Using modified Seldinger technique, a 10 French drain was placed into the gallbladder fossa, with aspiration of the sample for the lab. Contrast injection confirmed position of the tube within the gallbladder lumen. Drainage catheter was attached to gravity drain with a suture retention placed. Patient tolerated the procedure well and remained hemodynamically stable throughout. No complications were encountered and no significant blood loss encountered. IMPRESSION: Status post image guided percutaneous cholecystostomy Signed, Dulcy Fanny. Nadene Rubins, RPVI [facility] Electronically Signed   By: Corrie Mckusick D.O.  On: 04/14/2022 13:57   DG Abdomen Acute W/Chest  Result Date: 04/14/2022 CLINICAL DATA:  88 year old male with history of abdominal distension. EXAM: DG ABDOMEN ACUTE WITH 1 VIEW CHEST COMPARISON:  Chest x-ray 08/27/2019. FINDINGS: Lung volumes are low with bibasilar areas of scarring and/or atelectasis. No consolidative airspace disease. No pleural effusions. No pneumothorax. No pulmonary nodule or mass noted. Pulmonary vasculature and the cardiomediastinal silhouette are within normal limits. Atherosclerotic calcifications are noted in the thoracic aorta. Multiple prominent gas-filled loops of small bowel fill the central abdomen, many of which are borderline dilated. Several air-fluid levels are noted in the small bowel. Large amount of gas and stool noted throughout the colon and distal rectum. No pneumoperitoneum. IMPRESSION: 1. Nonspecific bowel-gas pattern, as above. The possibility of early or  partial small bowel obstruction should be considered, although not strongly favored on the basis of today's examination, particularly in light of the recent CT study. 2. No pneumoperitoneum. 3. Low lung volumes without radiographic evidence of acute cardiopulmonary disease. 4. Aortic atherosclerosis. Electronically Signed   By: Vinnie Langton M.D.   On: 04/14/2022 06:28   US Abdomen Limited RUQ (LIVER/GB)  Result Date: 04/13/2022 CLINICAL DATA:  Abdominal pain. EXAM: ULTRASOUND ABDOMEN LIMITED RIGHT UPPER QUADRANT COMPARISON:  CT abdomen and pelvis 04/13/2022 FINDINGS: Gallbladder: Thickened gallbladder wall measuring 5 mm. 1 cm gallstone. A positive sonographic Percell Miller sign was reported by the sonographer. Common bile duct: Diameter: 3 mm Liver: Suboptimally assessed due to bowel gas with the known left hepatic lobe cysts on CT not clearly shown by ultrasound. Portal vein is patent on color Doppler imaging with normal direction of blood flow towards the liver. Other: None. IMPRESSION: Cholelithiasis with gallbladder wall thickening and positive sonographic Murphy sign, suspicious for acute cholecystitis. Electronically Signed   By: Logan Bores M.D.   On: 04/13/2022 13:28   CT ABDOMEN PELVIS W CONTRAST  Result Date: 04/13/2022 CLINICAL DATA:  Abdominal pain, acute nonlocalized. Symptoms for 12 hours. EXAM: CT ABDOMEN AND PELVIS WITH CONTRAST TECHNIQUE: Multidetector CT imaging of the abdomen and pelvis was performed using the standard protocol following bolus administration of intravenous contrast. RADIATION DOSE REDUCTION: This exam was performed according to the departmental dose-optimization program which includes automated exposure control, adjustment of the mA and/or kV according to patient size and/or use of iterative reconstruction technique. CONTRAST:  41m OMNIPAQUE IOHEXOL 300 MG/ML  SOLN COMPARISON:  Abdominopelvic CT 08/27/2019.  PET-CT 06/13/2019. FINDINGS: Lower chest: Interval mildly  increased dependent opacities at both lung bases, likely reflecting atelectasis. No confluent airspace disease, residual suspicious nodularity or significant pleural effusion demonstrated. The heart is enlarged. There is atherosclerosis of the aorta and coronary arteries. Hepatobiliary: The liver is normal in density without suspicious focal abnormality. There are unchanged cysts within the left hepatic lobe. The gallbladder is mildly distended with mild wall thickening and surrounding inflammation. No calcified gallstones. No intra or extrahepatic biliary dilatation. Pancreas: Unremarkable. No pancreatic ductal dilatation or surrounding inflammatory changes. Spleen: Normal in size without focal abnormality. Adrenals/Urinary Tract: Both adrenal glands appear normal. No evidence of urinary tract calculus, suspicious renal lesion or hydronephrosis. Unchanged simple right renal cysts for which no follow-up imaging is recommended. The bladder appears unremarkable for its degree of distention. Stomach/Bowel: No enteric contrast administered. The stomach appears unremarkable for its degree of distention. Chronic nodular thickening along the lateral duodenal bulb measuring 1.6 x 1.2 cm on image 31/2 is unchanged. No evidence of bowel distension, wall thickening or surrounding inflammation. The appendix  appears normal. Mildly prominent stool throughout the colon. There are diverticular changes in the descending and sigmoid colon. Vascular/Lymphatic: There are no enlarged abdominal or pelvic lymph nodes. Aortic and branch vessel atherosclerosis without evidence of aneurysm or large vessel occlusion. The portal, superior mesenteric and splenic veins are patent. Reproductive: Stable mild prostatomegaly. Other: No evidence of abdominal wall mass or hernia. No ascites. Musculoskeletal: No acute or significant osseous findings. Chronic bilateral L5 pars defects with associated grade 1 anterolisthesis and moderate foraminal  narrowing bilaterally at L5-S1. IMPRESSION: 1. Distended gallbladder with mild wall thickening and surrounding inflammation suspicious for acute cholecystitis. No calcified gallstones or biliary dilatation. 2. No other acute findings or explanation for the patient's symptoms. 3. Distal colonic diverticulosis without evidence of acute inflammation. 4. Stable chronic nodular thickening along the lateral duodenal bulb. 5. Chronic bilateral L5 pars defects with associated grade 1 anterolisthesis and moderate foraminal narrowing bilaterally at L5-S1. 6. Previously demonstrated pulmonary nodularity is no longer clearly seen although may be obscured by increased atelectasis at both lung bases. 7.  Aortic Atherosclerosis (ICD10-I70.0). Electronically Signed   By: Richardean Sale M.D.   On: 04/13/2022 11:31      Subjective: Patient seen and examined at bedside today.  Hemodynamically stable.  Comfortable today.  Wife at bedside.  We discussed about discharge planning.  Discharge Exam: Vitals:   04/18/22 0815 04/18/22 1035  BP:  (!) 138/58  Pulse:    Resp:    Temp:    SpO2: 97%    Vitals:   04/17/22 2052 04/18/22 0508 04/18/22 0815 04/18/22 1035  BP: (!) 158/63 (!) 160/92  (!) 138/58  Pulse: (!) 57 60    Resp:  18    Temp:  (!) 97.3 F (36.3 C)    TempSrc:  Oral    SpO2:  96% 97%   Weight:      Height:        General: Pt is alert, awake, not in acute distress, confused Cardiovascular: RRR, S1/S2 +, no rubs, no gallops Respiratory: CTA bilaterally, no wheezing, no rhonchi Abdominal: Soft, NT, ND, bowel sounds +, right upper quadrant drain Extremities: no edema, no cyanosis    The results of significant diagnostics from this hospitalization (including imaging, microbiology, ancillary and laboratory) are listed below for reference.     Microbiology: Recent Results (from the past 240 hour(s))  Culture, blood (Routine X 2) w Reflex to ID Panel     Status: None   Collection Time: 04/13/22   8:28 PM   Specimen: BLOOD  Result Value Ref Range Status   Specimen Description   Final    BLOOD BLOOD RIGHT HAND Performed at [facility] [address], [place], [place] [address]    Special Requests   Final    BOTTLES DRAWN AEROBIC AND ANAEROBIC Blood Culture results may not be optimal due to an excessive volume of blood received in culture bottles Performed at [facility] [address], [place], [place] [address]    Culture   Final    NO GROWTH 5 DAYS Performed at [facility], [place] [address], [place], [place] [address]    Report Status 04/18/2022 FINAL  Final  Culture, blood (Routine X 2) w Reflex to ID Panel     Status: None   Collection Time: 04/13/22  8:28 PM   Specimen: BLOOD  Result Value Ref Range Status   Specimen Description   Final    BLOOD BLOOD LEFT  HAND Performed at [facility], [place] [address], [place], [place] [address]    Special Requests   Final    BOTTLES DRAWN AEROBIC AND ANAEROBIC Blood Culture adequate volume Performed at [facility] [address], [place], [place] [address]    Culture   Final    NO GROWTH 5 DAYS Performed at [facility], [place] [address], [place], [place] [address]    Report Status 04/18/2022 FINAL  Final  MRSA Next Gen by PCR, Nasal     Status: None   Collection Time: 04/14/22 11:37 AM   Specimen: Nasal Mucosa; Nasal Swab  Result Value Ref Range Status   MRSA by PCR Next Gen NOT DETECTED NOT DETECTED Final    Comment: (NOTE) The GeneXpert MRSA Assay (FDA approved for NASAL specimens only), is one component of a comprehensive MRSA colonization surveillance program. It is not intended to diagnose MRSA infection nor to guide or monitor treatment for MRSA infections. Test performance is not FDA approved in patients less than 16 years old. Performed at [facility], [place] [address], [place], [place] [address]    Aerobic/Anaerobic Culture w Gram Stain (surgical/deep wound)     Status: None (Preliminary result)   Collection Time: 04/14/22 12:50 PM   Specimen: BILE  Result Value Ref Range Status   Specimen Description   Final    BILE Performed at [facility] [address], [place], [place] [address]    Special Requests   Final    NONE Performed at [facility], [place] [address], [place], [place] [address]    Gram Stain NO WBC SEEN NO ORGANISMS SEEN   Final   Culture   Final    NO GROWTH 3 DAYS NO ANAEROBES ISOLATED; CULTURE IN PROGRESS FOR 5 DAYS Performed at [facility] [address] [place], [place] [address]    Report Status PENDING  Incomplete     Labs: BNP (last 3 results) No results for input(s): "BNP" in the last 8760 hours. Basic Metabolic Panel: Recent Labs  Lab 04/14/22 0421 04/15/22 0239 04/16/22 0224 04/17/22 0637 04/18/22 0729  NA 137 141 141 141 140  K 5.4* 4.4 4.1 3.4* 3.7  CL 103 109 115* 116* 115*  CO2 24 24 21* 21* 19*  GLUCOSE 129* 85 73 99 98  BUN 32* 36* 28* 21 16  CREATININE 1.80* 1.71* 1.39* 1.28* 1.16  CALCIUM 8.5* 7.7* 7.0* 7.4* 7.5*   Liver Function Tests: Recent Labs  Lab 04/13/22 0936 04/14/22 0421 04/15/22 0239  AST '26 18 18  '$ ALT '28 23 16  '$ ALKPHOS 62 55 43  BILITOT 0.8 0.4 0.2*  PROT 6.0* 6.0* 4.8*  ALBUMIN 2.9* 2.9* 2.3*   Recent Labs  Lab 04/13/22 0936  LIPASE 25   No results for input(s): "AMMONIA" in the last 168 hours. CBC: Recent Labs  Lab 04/13/22 0936 04/14/22 0421 04/15/22 0239 04/16/22 0224  WBC 15.6* 15.9* 7.7 5.7  HGB 14.6 13.9 11.0* 10.6*  HCT 46.1 43.2 35.4* 35.0*  MCV 96.0 95.6 99.2 101.2*  PLT 264 290 203 210   Cardiac Enzymes: No results for input(s): "CKTOTAL", "CKMB", "CKMBINDEX", "TROPONINI" in the last 168 hours. BNP: Invalid input(s): "POCBNP" CBG: Recent Labs  Lab 04/14/22 0634  GLUCAP 127*   D-Dimer No results for input(s): "DDIMER" in the  last 72 hours. Hgb A1c No results for input(s): "HGBA1C" in the last 72 hours. Lipid Profile No  results for input(s): "CHOL", "HDL", "LDLCALC", "TRIG", "CHOLHDL", "LDLDIRECT" in the last 72 hours. Thyroid function studies No results for input(s): "TSH", "T4TOTAL", "T3FREE", "THYROIDAB" in the last 72 hours.  Invalid input(s): "FREET3" Anemia work up No results for input(s): "VITAMINB12", "FOLATE", "FERRITIN", "TIBC", "IRON", "RETICCTPCT" in the last 72 hours. Urinalysis    Component Value Date/Time   COLORURINE YELLOW 04/13/2022 1418   APPEARANCEUR CLEAR 04/13/2022 1418   LABSPEC 1.036 (H) 04/13/2022 1418   PHURINE 5.0 04/13/2022 1418   GLUCOSEU NEGATIVE 04/13/2022 1418   HGBUR NEGATIVE 04/13/2022 1418   BILIRUBINUR NEGATIVE 04/13/2022 1418   KETONESUR 5 (A) 04/13/2022 1418   PROTEINUR 30 (A) 04/13/2022 1418   NITRITE NEGATIVE 04/13/2022 1418   LEUKOCYTESUR NEGATIVE 04/13/2022 1418   Sepsis Labs Recent Labs  Lab 04/13/22 0936 04/14/22 0421 04/15/22 0239 04/16/22 0224  WBC 15.6* 15.9* 7.7 5.7   Microbiology Recent Results (from the past 240 hour(s))  Culture, blood (Routine X 2) w Reflex to ID Panel     Status: None   Collection Time: 04/13/22  8:28 PM   Specimen: BLOOD  Result Value Ref Range Status   Specimen Description   Final    BLOOD BLOOD RIGHT HAND Performed at [facility], [place] [address], [place], [place] [address]    Special Requests   Final    BOTTLES DRAWN AEROBIC AND ANAEROBIC Blood Culture results may not be optimal due to an excessive volume of blood received in culture bottles Performed at [facility] [address], [place], [place] [address]    Culture   Final    NO GROWTH 5 DAYS Performed at [facility], [place] [address] [place], [place] [address]    Report Status 04/18/2022 FINAL  Final  Culture, blood (Routine X 2) w Reflex to ID Panel     Status: None   Collection Time: 04/13/22  8:28 PM    Specimen: BLOOD  Result Value Ref Range Status   Specimen Description   Final    BLOOD BLOOD LEFT HAND Performed at [facility] [address] [place], [place] [address]    Special Requests   Final    BOTTLES DRAWN AEROBIC AND ANAEROBIC Blood Culture adequate volume Performed at [facility] [address] [place], [place] [address]    Culture   Final    NO GROWTH 5 DAYS Performed at [facility], [place] [address] [place], [place] [address]    Report Status 04/18/2022 FINAL  Final  MRSA Next Gen by PCR, Nasal     Status: None   Collection Time: 04/14/22 11:37 AM   Specimen: Nasal Mucosa; Nasal Swab  Result Value Ref Range Status   MRSA by PCR Next Gen NOT DETECTED NOT DETECTED Final    Comment: (NOTE) The GeneXpert MRSA Assay (FDA approved for NASAL specimens only), is one component of a comprehensive MRSA colonization surveillance program. It is not intended to diagnose MRSA infection nor to guide or monitor treatment for MRSA infections. Test performance is not FDA approved in patients less than 43 years old. Performed at [facility], [place] [address] [place] [place],  [id number]   Aerobic/Anaerobic Culture w Gram Stain (surgical/deep wound)     Status: None (Preliminary result)   Collection Time: 04/14/22 12:50 PM   Specimen: BILE  Result Value Ref Range Status   Specimen Description   Final    BILE Performed at [facility]  Hospital, [place] [address] [place], [place] [address]    Special Requests   Final    NONE Performed at [facility], [place] [address], [place], [place] [address]    Gram Stain NO WBC SEEN NO ORGANISMS SEEN   Final   Culture   Final    NO GROWTH 3 DAYS NO ANAEROBES ISOLATED; CULTURE IN PROGRESS FOR 5 DAYS Performed at [facility] [address], [place], [place] [address]    Report Status PENDING  Incomplete    Please note: You were cared for by  a hospitalist during your hospital stay. Once you are discharged, your primary care physician will handle any further medical issues. Please note that NO REFILLS for any discharge medications will be authorized once you are discharged, as it is imperative that you return to your primary care physician (or establish a relationship with a primary care physician if you do not have one) for your post hospital discharge needs so that they can reassess your need for medications and monitor your lab values.    Time coordinating discharge: 40 minutes  SIGNED:   Shelly Coss, MD  [facility] 04/18/2022, 10:51 AM Pager [phone]  If 7PM-7AM, please contact night-coverage [email or web address] Password [email or web address]

## 2022-04-19 LAB — AEROBIC/ANAEROBIC CULTURE W GRAM STAIN (SURGICAL/DEEP WOUND)
Culture: NO GROWTH
Gram Stain: NONE SEEN

## 2022-04-26 ENCOUNTER — Encounter (HOSPITAL_COMMUNITY): Payer: Self-pay | Admitting: Emergency Medicine

## 2022-04-26 ENCOUNTER — Other Ambulatory Visit: Payer: Self-pay

## 2022-04-26 ENCOUNTER — Emergency Department (HOSPITAL_COMMUNITY)

## 2022-04-26 ENCOUNTER — Emergency Department (HOSPITAL_COMMUNITY)
Admission: EM | Admit: 2022-04-26 | Discharge: 2022-04-26 | Disposition: A | Discharge status: Home / Self Care | Attending: Emergency Medicine | Admitting: Emergency Medicine

## 2022-04-26 DIAGNOSIS — R1011 Right upper quadrant pain: Secondary | ICD-10-CM | POA: Diagnosis not present

## 2022-04-26 DIAGNOSIS — R109 Unspecified abdominal pain: Secondary | ICD-10-CM | POA: Diagnosis not present

## 2022-04-26 DIAGNOSIS — N2889 Other specified disorders of kidney and ureter: Secondary | ICD-10-CM | POA: Diagnosis not present

## 2022-04-26 DIAGNOSIS — R079 Chest pain, unspecified: Secondary | ICD-10-CM | POA: Diagnosis not present

## 2022-04-26 DIAGNOSIS — Z85828 Personal history of other malignant neoplasm of skin: Secondary | ICD-10-CM | POA: Insufficient documentation

## 2022-04-26 DIAGNOSIS — R1084 Generalized abdominal pain: Secondary | ICD-10-CM | POA: Insufficient documentation

## 2022-04-26 DIAGNOSIS — J449 Chronic obstructive pulmonary disease, unspecified: Secondary | ICD-10-CM | POA: Insufficient documentation

## 2022-04-26 DIAGNOSIS — I130 Hypertensive heart and chronic kidney disease with heart failure and stage 1 through stage 4 chronic kidney disease, or unspecified chronic kidney disease: Secondary | ICD-10-CM | POA: Insufficient documentation

## 2022-04-26 DIAGNOSIS — I5030 Unspecified diastolic (congestive) heart failure: Secondary | ICD-10-CM | POA: Insufficient documentation

## 2022-04-26 DIAGNOSIS — K769 Liver disease, unspecified: Secondary | ICD-10-CM | POA: Diagnosis not present

## 2022-04-26 DIAGNOSIS — N189 Chronic kidney disease, unspecified: Secondary | ICD-10-CM | POA: Insufficient documentation

## 2022-04-26 DIAGNOSIS — K5730 Diverticulosis of large intestine without perforation or abscess without bleeding: Secondary | ICD-10-CM | POA: Diagnosis not present

## 2022-04-26 DIAGNOSIS — F0390 Unspecified dementia without behavioral disturbance: Secondary | ICD-10-CM | POA: Insufficient documentation

## 2022-04-26 DIAGNOSIS — Z7401 Bed confinement status: Secondary | ICD-10-CM | POA: Diagnosis not present

## 2022-04-26 DIAGNOSIS — Z87891 Personal history of nicotine dependence: Secondary | ICD-10-CM | POA: Diagnosis not present

## 2022-04-26 DIAGNOSIS — Z743 Need for continuous supervision: Secondary | ICD-10-CM | POA: Diagnosis not present

## 2022-04-26 LAB — CBC
HCT: 44.5 % (ref 39.0–52.0)
Hemoglobin: 14.2 g/dL (ref 13.0–17.0)
MCH: 30.4 pg (ref 26.0–34.0)
MCHC: 31.9 g/dL (ref 30.0–36.0)
MCV: 95.3 fL (ref 80.0–100.0)
Platelets: 390 10*3/uL (ref 150–400)
RBC: 4.67 MIL/uL (ref 4.22–5.81)
RDW: 12.8 % (ref 11.5–15.5)
WBC: 13.6 10*3/uL — ABNORMAL HIGH (ref 4.0–10.5)
nRBC: 0 % (ref 0.0–0.2)

## 2022-04-26 LAB — COMPREHENSIVE METABOLIC PANEL
ALT: 11 U/L (ref 0–44)
AST: 12 U/L — ABNORMAL LOW (ref 15–41)
Albumin: 3.2 g/dL — ABNORMAL LOW (ref 3.5–5.0)
Alkaline Phosphatase: 51 U/L (ref 38–126)
Anion gap: 8 (ref 5–15)
BUN: 20 mg/dL (ref 8–23)
CO2: 27 mmol/L (ref 22–32)
Calcium: 8.8 mg/dL — ABNORMAL LOW (ref 8.9–10.3)
Chloride: 104 mmol/L (ref 98–111)
Creatinine, Ser: 1.33 mg/dL — ABNORMAL HIGH (ref 0.61–1.24)
GFR, Estimated: 52 mL/min — ABNORMAL LOW (ref >60)
Glucose, Bld: 131 mg/dL — ABNORMAL HIGH (ref 70–99)
Potassium: 3.9 mmol/L (ref 3.5–5.1)
Sodium: 139 mmol/L (ref 135–145)
Total Bilirubin: 0.9 mg/dL (ref 0.3–1.2)
Total Protein: 6.2 g/dL — ABNORMAL LOW (ref 6.5–8.1)

## 2022-04-26 LAB — PROTIME-INR
INR: 1.1 (ref 0.8–1.2)
Prothrombin Time: 14 seconds (ref 11.4–15.2)

## 2022-04-26 LAB — LIPASE, BLOOD: Lipase: 25 U/L (ref 11–51)

## 2022-04-26 MED ORDER — SODIUM CHLORIDE 0.9 % IV BOLUS
500.0000 mL | Freq: Once | INTRAVENOUS | Status: AC
  Administered 2022-04-26: 500 mL via INTRAVENOUS

## 2022-04-26 MED ORDER — SODIUM CHLORIDE (PF) 0.9 % IJ SOLN
INTRAMUSCULAR | Status: AC
  Filled 2022-04-26: qty 50

## 2022-04-26 MED ORDER — IOHEXOL 300 MG/ML  SOLN
80.0000 mL | Freq: Once | INTRAMUSCULAR | Status: AC | PRN
  Administered 2022-04-26: 80 mL via INTRAVENOUS

## 2022-04-26 MED ORDER — HYDROMORPHONE HCL 1 MG/ML IJ SOLN
0.5000 mg | Freq: Once | INTRAMUSCULAR | Status: AC
  Administered 2022-04-26: 0.5 mg via INTRAVENOUS
  Filled 2022-04-26: qty 1

## 2022-04-26 NOTE — Discharge Instructions (Signed)
You were evaluated in the Emergency Department and after careful evaluation, we did not find any emergent condition requiring admission or further testing in the hospital.  Your exam/testing today was overall reassuring.  Recommend calling your surgeon later today to schedule that follow-up appointment.  Continue the morphine at home as needed.  Please return to the Emergency Department if you experience any worsening of your condition.  Thank you for allowing [place] to be a part of your care.

## 2022-04-26 NOTE — ED Notes (Signed)
Hospice RN called offering any information we may need.  Feel free to call if additional information  [phone]

## 2022-04-26 NOTE — ED Notes (Signed)
Ptar called for pt 

## 2022-04-26 NOTE — ED Notes (Signed)
Patient transported home via [facility]

## 2022-04-26 NOTE — ED Triage Notes (Signed)
Per EMS, pt from home, pt c/o abdominal pain around "gallbladder tube."  Wife reports increased drainage (500CC)  that is now "yellow".  She is unable to control pain.  Has hx of dementia.  .  142/84 95HR  97% RA

## 2022-04-26 NOTE — ED Notes (Signed)
Provider bedside.

## 2022-04-26 NOTE — ED Notes (Signed)
Pt calls out in pain even when nobody is touching him. Pt is very anxious, RN informed him that we would let him know if we were going to touch him. RN found pt's wife and brought her bedside.

## 2022-04-26 NOTE — ED Provider Notes (Signed)
[facility] Emergency Department Provider Note MRN:  [id number]  Arrival date & time: 04/26/22     Chief Complaint   Pain   History of Present Illness   Kevin Lynch. is a 88 y.o. year-old male with a history of COPD, CHF presenting to the ED with chief complaint of pain.  Right upper quadrant abdominal pain with increased output from recent surgical tube placed in the right upper quadrant.  Review of Systems  I was unable to obtain a full/accurate HPI, PMH, or ROS due to the patient's dementia.  Patient's Health History    Past Medical History:  Diagnosis Date   Actinic keratosis    Anemia    Arthritis    Ataxic gait 06/09/2015   Basal cell carcinoma    skin - chest, back and face   BPH (benign prostatic hyperplasia) 06/09/2015   Cataracts, bilateral    removed by surgery   Cerebral vascular disease 06/09/2015   Chronic diarrhea 01/07/2018   Chronic gastric ulcer 06/10/2015   CKD (chronic kidney disease) 02/01/2018   Claudication, intermittent ([place]) 06/09/2015   COPD (chronic obstructive pulmonary disease) ([place])    COPD (chronic obstructive pulmonary disease) ([place]) 06/09/2015   Dementia ([place])    Depression    Diastolic CHF ([place]) 0/05/7508   Procedure narrative: Transthoracic echocardiography. Image   quality was adequate. Intravenous contrast (Definity) was   administered. - Left ventricle: The cavity size was normal. There was moderate   focal basal and mild concentric hypertrophy. Systolic function   was normal. The estimated ejection fraction was in the range of   60% to 65%. Wall motion was normal; there were no regional wall   m   Diastolic heart failure (HCC)    Diverticulitis    Diverticulitis    Enlarged thoracic aorta ([place]) 01/31/2018   Former smoker    quit 1983-smoked 30 yrs   Gastric ulcer    from 06/06/15: ULCER/GASTRITIS/ANEMIA:   Gastroesophageal reflux disease with esophagitis 06/10/2015   HDL lipoprotein deficiency    History of blood  transfusion    History of stroke    Hyperglycemia    Hyperlipidemia    no meds   Hypertension    Hypertensive heart disease 06/09/2015   Lower extremity edema 06/09/2015   Male erectile dysfunction, unspecified 06/09/2015   Melanoma ([place])    left side of face   Mohs defect 12/01/2019   Open wound of face 11/10/2019   Physical deconditioning 07/04/2017   Pneumonia    x 1   Pre-diabetes    does not check blood sugar   Prediabetes    Recurrent major depressive disorder, in full remission ([place]) 06/09/2015   Reduced libido 06/09/2015   Restless leg syndrome    Simple chronic bronchitis ([place]) 07/04/2017   IgE >> 55 on 06/26/2017    Stroke ([place]) 2012   Vascular dementia ([place]) 06/09/2015   Vitamin B 12 deficiency 06/09/2015   Vitamin D deficiency 01/07/2018   Wears glasses     Past Surgical History:  Procedure Laterality Date   ADJACENT TISSUE TRANSFER/TISSUE REARRANGEMENT N/A 12/01/2019   Procedure: Reconstruction of nasal defect with paramedian forehead flap and ear cartilage graft;  Surgeon: Cindra Presume, MD;  Location: [place] [facility];  Service: Plastics;  Laterality: N/A;   CATARACT EXTRACTION     COLONOSCOPY     DRUG INDUCED ENDOSCOPY     HEMORROIDECTOMY     IR PERC CHOLECYSTOSTOMY  04/14/2022   LESION EXCISION  WITH COMPLEX REPAIR Left 08/19/2019   Procedure: LEFT FACIAL MELANOMA EXCISION WITH COMPLEX REPAIR;  Surgeon: Izora Gala, MD;  Location: [place];  Service: ENT;  Laterality: Left;   LYMPH NODE BIOPSY Left 08/19/2019   Procedure: LEFT LEVEL THREE SENTINEL LYMPH NODE DISSECTION/BIOPSY;  Surgeon: Izora Gala, MD;  Location: [place];  Service: ENT;  Laterality: Left;   MELANOMA EXCISION     NASAL SEPTUM SURGERY     PAROTIDECTOMY Left 08/19/2019   LEFT SUPERFICIAL PAROTIDECTOMY, with facial nerve dissection   PAROTIDECTOMY Left 08/19/2019   Procedure: LEFT PAROTIDECTOMY;  Surgeon: Izora Gala, MD;  Location: [place];  Service: ENT;  Laterality: Left;   PROSTATE SURGERY     SENTINEL LYMPH NODE BIOPSY   08/19/2019   LEFT LEVEL THREE SENTINEL LYMPH NODE DISSECTION/BIOPSY   SKIN FULL THICKNESS GRAFT Left 08/19/2019   Procedure: SKIN GRAFT FULL THICKNESS;  Surgeon: Izora Gala, MD;  Location: [facility];  Service: ENT;  Laterality: Left;   VASECTOMY      Family History  Problem Relation Age of Onset   Stroke Mother    Hypertension Mother    CVA Mother    Heart attack Mother    Hypertension Father    Stroke Father    Heart attack Father    Asthma Brother    Hypertension Brother    Heart attack Brother    Hyperlipidemia Brother     Social History   Socioeconomic History   Marital status: Married    Spouse name: Not on file   Number of children: Not on file   Years of education: Not on file   Highest education level: Not on file  Occupational History   Not on file  Tobacco Use   Smoking status: Former    Packs/day: 0.50    Years: 30.00    Total pack years: 15.00    Types: Cigarettes    Quit date: 05/01/1981    Years since quitting: 41.0   Smokeless tobacco: Former    Types: Nurse, children's Use: Never used  Substance and Sexual Activity   Alcohol use: Not Currently   Drug use: Never   Sexual activity: Not on file  Other Topics Concern   Not on file  Social History Narrative   Not on file   Social Determinants of Health   Financial Resource Strain: Not on file  Food Insecurity: No Food Insecurity (04/14/2022)   Hunger Vital Sign    Worried About Running Out of Food in the Last Year: Never true    Ran Out of Food in the Last Year: Never true  Transportation Needs: No Transportation Needs (04/14/2022)   PRAPARE - Hydrologist (Medical): No    Lack of Transportation (Non-Medical): No  Physical Activity: Not on file  Stress: Not on file  Social Connections: Not on file  Intimate Partner Violence: Not on file     Physical Exam   Vitals:   04/26/22 0530 04/26/22 0545  BP: 131/76 138/70  Pulse: 83 80  Resp: 16 14  Temp:     SpO2: 93% 94%    CONSTITUTIONAL: Chronically-appearing, NAD NEURO/PSYCH:  Alert and oriented x 3, no focal deficits EYES:  eyes equal and reactive ENT/NECK:  no LAD, no JVD CARDIO: Regular rate, well-perfused, normal S1 and S2 PULM:  CTAB no wheezing or rhonchi GI/GU:  non-distended, non-tender MSK/SPINE:  No gross deformities, no edema SKIN:  no rash, atraumatic   *  Additional and/or pertinent findings included in MDM below  Diagnostic and Interventional Summary    EKG Interpretation  Date/Time:  Wednesday April 26 2022 02:45:18 EST Ventricular Rate:  97 PR Interval:  269 QRS Duration: 100 QT Interval:  369 QTC Calculation: 469 R Axis:   29 Text Interpretation: Sinus rhythm Prolonged PR interval Low voltage, precordial leads RSR' in V1 or V2, right VCD or RVH Borderline T abnormalities, diffuse leads Confirmed by Gerlene Fee [phone]) on 04/26/2022 2:58:37 AM       Labs Reviewed  CBC - Abnormal; Notable for the following components:      Result Value   WBC 13.6 (*)    All other components within normal limits  COMPREHENSIVE METABOLIC PANEL - Abnormal; Notable for the following components:   Glucose, Bld 131 (*)    Creatinine, Ser 1.33 (*)    Calcium 8.8 (*)    Total Protein 6.2 (*)    Albumin 3.2 (*)    AST 12 (*)    GFR, Estimated 52 (*)    All other components within normal limits  LIPASE, BLOOD  PROTIME-INR    CT ABDOMEN PELVIS W CONTRAST  Final Result    DG Chest Port 1 View  Final Result      Medications  sodium chloride 0.9 % bolus 500 mL (0 mLs Intravenous Stopped 04/26/22 0605)  sodium chloride (PF) 0.9 % injection (  Given by Other 04/26/22 0538)  iohexol (OMNIPAQUE) 300 MG/ML solution 80 mL (80 mLs Intravenous Contrast Given 04/26/22 0418)     Procedures  /  Critical Care Procedures  ED Course and Medical Decision Making  Initial Impression and Ddx Right upper quadrant pain and tenderness, recent surgery, bili tube with increased  drainage, considerations include postoperative seroma versus infection, awaiting CT abdomen.  Past medical/surgical history that increases complexity of ED encounter: History of dementia, recent cholecystitis with percutaneous bili tube placed by IR  Interpretation of Diagnostics I personally reviewed the EKG and my interpretation is as follows: Sinus rhythm  Labs reveal minimal leukocytosis, otherwise no significant blood count or electrolyte disturbance.  CT abdomen is without acute process, fully compressed gallbladder with well-placed bili tube.  Patient Reassessment and Ultimate Disposition/Management     Patient resting comfortably for several hours, no return of pain.  Patient's wife is at bedside, given the reassuring CT scan, normal vital signs, no fever, no signs of infection or malfunction on CT scan, patient is appropriate for discharge, he has follow-up coming with the surgeons.  Patient management required discussion with the following services or consulting groups:  None  Complexity of Problems Addressed Acute illness or injury that poses threat of life of bodily function  Additional Data Reviewed and Analyzed Further history obtained from: Further history from spouse/family member  Additional Factors Impacting ED Encounter Risk None  Kevin Lynch, [place] [email or web address]  Final Clinical Impressions(s) / ED Diagnoses     ICD-10-CM   1. Generalized abdominal pain  R10.84       ED Discharge Orders     None        Discharge Instructions Discussed with and Provided to Patient:     Discharge Instructions      You were evaluated in the Emergency Department and after careful evaluation, we did not find any emergent condition requiring admission or further testing in the hospital.  Your exam/testing today was overall reassuring.  Recommend calling your surgeon later today  to schedule that  follow-up appointment.  Continue the morphine at home as needed.  Please return to the Emergency Department if you experience any worsening of your condition.  Thank you for allowing [place] to be a part of your care.        Maudie Flakes, MD 04/26/22 [phone]

## 2022-04-27 ENCOUNTER — Other Ambulatory Visit (HOSPITAL_COMMUNITY): Payer: Self-pay | Admitting: General Surgery

## 2022-04-27 DIAGNOSIS — K810 Acute cholecystitis: Secondary | ICD-10-CM

## 2022-04-28 DIAGNOSIS — Z0000 Encounter for general adult medical examination without abnormal findings: Secondary | ICD-10-CM | POA: Diagnosis not present

## 2022-04-30 DIAGNOSIS — Z0000 Encounter for general adult medical examination without abnormal findings: Secondary | ICD-10-CM | POA: Diagnosis not present

## 2022-05-02 ENCOUNTER — Other Ambulatory Visit (HOSPITAL_COMMUNITY): Payer: Self-pay

## 2022-05-03 ENCOUNTER — Other Ambulatory Visit (HOSPITAL_COMMUNITY): Payer: Self-pay

## 2022-05-03 MED ORDER — NORMAL SALINE FLUSH 0.9 % IV SOLN
INTRAVENOUS | 0 refills | Status: AC
  Filled 2022-05-03: qty 550, 28d supply, fill #0

## 2022-05-30 ENCOUNTER — Other Ambulatory Visit: Payer: Medicare Other

## 2022-06-01 DEATH — deceased
